# Patient Record
Sex: Male | Born: 1990
Health system: Southern US, Community
[De-identification: ages and names within clinical notes are randomized; demographics above are authoritative.]

## PROBLEM LIST (undated history)

## (undated) DIAGNOSIS — N508 Other specified disorders of male genital organs: Secondary | ICD-10-CM

## (undated) DIAGNOSIS — F419 Anxiety disorder, unspecified: Secondary | ICD-10-CM

## (undated) DIAGNOSIS — R569 Unspecified convulsions: Secondary | ICD-10-CM

## (undated) DIAGNOSIS — J019 Acute sinusitis, unspecified: Secondary | ICD-10-CM

## (undated) DIAGNOSIS — N390 Urinary tract infection, site not specified: Secondary | ICD-10-CM

## (undated) HISTORY — DX: Urinary tract infection, site not specified: N39.0

## (undated) HISTORY — DX: Anxiety disorder, unspecified: F41.9

## (undated) HISTORY — DX: Unspecified convulsions: R56.9

## (undated) HISTORY — DX: Acute sinusitis, unspecified: J01.90

## (undated) HISTORY — PX: ANKLE ARTHROSCOPY: SHX545

## (undated) HISTORY — PX: DG THUMB RIGHT HAND (ARMC HX): HXRAD1825

## (undated) HISTORY — PX: MYRINGOTOMY WITH TUBE PLACEMENT: SHX5663

## (undated) HISTORY — DX: Other specified disorders of male genital organs: N50.8

## (undated) HISTORY — PX: TOE SURGERY: SHX1073

---
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
## 2009-04-07 ENCOUNTER — Ambulatory Visit: Payer: Self-pay | Admitting: Psychologist

## 2009-04-20 ENCOUNTER — Ambulatory Visit: Payer: Self-pay | Admitting: Psychologist

## 2009-05-12 ENCOUNTER — Ambulatory Visit: Payer: Self-pay | Admitting: Psychologist

## 2009-07-21 ENCOUNTER — Ambulatory Visit: Payer: Self-pay | Admitting: Psychologist

## 2009-08-19 ENCOUNTER — Ambulatory Visit: Payer: Self-pay | Admitting: Psychologist

## 2009-09-23 ENCOUNTER — Ambulatory Visit: Payer: Self-pay | Admitting: Psychologist

## 2009-11-07 ENCOUNTER — Ambulatory Visit: Payer: Self-pay | Admitting: Internal Medicine

## 2009-11-08 LAB — CONVERTED CEMR LAB
ALT: 22 units/L (ref 0–53)
AST: 16 units/L (ref 0–37)
Albumin: 4.3 g/dL (ref 3.5–5.2)
Alkaline Phosphatase: 53 units/L (ref 39–117)
BUN: 15 mg/dL (ref 6–23)
Basophils Absolute: 0 10*3/uL (ref 0.0–0.1)
Basophils Relative: 0.7 % (ref 0.0–3.0)
Bilirubin Urine: NEGATIVE
Bilirubin, Direct: 0.2 mg/dL (ref 0.0–0.3)
CO2: 23 meq/L (ref 19–32)
Calcium: 9.8 mg/dL (ref 8.4–10.5)
Chloride: 106 meq/L (ref 96–112)
Cholesterol: 132 mg/dL (ref 0–200)
Creatinine, Ser: 0.9 mg/dL (ref 0.4–1.5)
Eosinophils Absolute: 0.1 10*3/uL (ref 0.0–0.7)
Eosinophils Relative: 2.3 % (ref 0.0–5.0)
GFR calc non Af Amer: 115.48 mL/min (ref >60)
Glucose, Bld: 80 mg/dL (ref 70–99)
HCT: 42.7 % (ref 39.0–52.0)
HDL: 43.5 mg/dL (ref >39.00)
Hemoglobin, Urine: NEGATIVE
Hemoglobin: 14.6 g/dL (ref 13.0–17.0)
Ketones, ur: NEGATIVE mg/dL
LDL Cholesterol: 81 mg/dL (ref 0–99)
Lymphocytes Relative: 18.4 % (ref 12.0–46.0)
Lymphs Abs: 1 10*3/uL (ref 0.7–4.0)
MCHC: 34.1 g/dL (ref 30.0–36.0)
MCV: 97.6 fL (ref 78.0–100.0)
Monocytes Absolute: 0.5 10*3/uL (ref 0.1–1.0)
Monocytes Relative: 9.9 % (ref 3.0–12.0)
Neutro Abs: 3.8 10*3/uL (ref 1.4–7.7)
Neutrophils Relative %: 68.7 % (ref 43.0–77.0)
Nitrite: POSITIVE
Platelets: 217 10*3/uL (ref 150.0–400.0)
Potassium: 3.8 meq/L (ref 3.5–5.1)
RBC: 4.38 M/uL (ref 4.22–5.81)
RDW: 11.6 % (ref 11.5–14.6)
Sodium: 141 meq/L (ref 135–145)
Specific Gravity, Urine: 1.02 (ref 1.000–1.030)
TSH: 1.11 microintl units/mL (ref 0.35–5.50)
Total Bilirubin: 0.9 mg/dL (ref 0.3–1.2)
Total CHOL/HDL Ratio: 3
Total Protein: 6.7 g/dL (ref 6.0–8.3)
Triglycerides: 37 mg/dL (ref 0.0–149.0)
Urine Glucose: NEGATIVE mg/dL
Urobilinogen, UA: 1 (ref 0.0–1.0)
VLDL: 7.4 mg/dL (ref 0.0–40.0)
WBC: 5.4 10*3/uL (ref 4.5–10.5)
pH: 6.5 (ref 5.0–8.0)

## 2009-11-09 ENCOUNTER — Ambulatory Visit: Payer: Self-pay | Admitting: Internal Medicine

## 2009-11-09 DIAGNOSIS — N508 Other specified disorders of male genital organs: Secondary | ICD-10-CM

## 2009-11-09 DIAGNOSIS — N390 Urinary tract infection, site not specified: Secondary | ICD-10-CM

## 2009-11-09 HISTORY — DX: Urinary tract infection, site not specified: N39.0

## 2009-11-09 HISTORY — DX: Other specified disorders of male genital organs: N50.8

## 2009-11-14 ENCOUNTER — Encounter: Admission: RE | Admit: 2009-11-14 | Discharge: 2009-11-14 | Payer: Self-pay | Admitting: Internal Medicine

## 2010-01-06 ENCOUNTER — Ambulatory Visit: Payer: Self-pay | Admitting: Psychologist

## 2010-09-21 ENCOUNTER — Ambulatory Visit: Payer: Self-pay | Admitting: Internal Medicine

## 2010-09-21 LAB — CONVERTED CEMR LAB
ALT: 19 units/L (ref 0–53)
AST: 19 units/L (ref 0–37)
Albumin: 4.8 g/dL (ref 3.5–5.2)
Alkaline Phosphatase: 60 units/L (ref 39–117)
BUN: 13 mg/dL (ref 6–23)
Basophils Absolute: 0 10*3/uL (ref 0.0–0.1)
Basophils Relative: 0.4 % (ref 0.0–3.0)
Bilirubin Urine: NEGATIVE
Bilirubin, Direct: 0.2 mg/dL (ref 0.0–0.3)
CO2: 30 meq/L (ref 19–32)
Calcium: 9.8 mg/dL (ref 8.4–10.5)
Chloride: 98 meq/L (ref 96–112)
Cholesterol: 150 mg/dL (ref 0–200)
Creatinine, Ser: 1.1 mg/dL (ref 0.4–1.5)
Eosinophils Absolute: 0.1 10*3/uL (ref 0.0–0.7)
Eosinophils Relative: 1.1 % (ref 0.0–5.0)
GFR calc non Af Amer: 92.72 mL/min (ref >60)
Glucose, Bld: 96 mg/dL (ref 70–99)
HCT: 45.6 % (ref 39.0–52.0)
HDL: 36.6 mg/dL — ABNORMAL LOW (ref >39.00)
Hemoglobin, Urine: NEGATIVE
Hemoglobin: 15.9 g/dL (ref 13.0–17.0)
Ketones, ur: NEGATIVE mg/dL
LDL Cholesterol: 95 mg/dL (ref 0–99)
Leukocytes, UA: NEGATIVE
Lymphocytes Relative: 30.1 % (ref 12.0–46.0)
Lymphs Abs: 1.4 10*3/uL (ref 0.7–4.0)
MCHC: 34.9 g/dL (ref 30.0–36.0)
MCV: 96.9 fL (ref 78.0–100.0)
Monocytes Absolute: 0.4 10*3/uL (ref 0.1–1.0)
Monocytes Relative: 8.7 % (ref 3.0–12.0)
Neutro Abs: 2.8 10*3/uL (ref 1.4–7.7)
Neutrophils Relative %: 59.7 % (ref 43.0–77.0)
Nitrite: NEGATIVE
Platelets: 228 10*3/uL (ref 150.0–400.0)
Potassium: 4.4 meq/L (ref 3.5–5.1)
RBC: 4.71 M/uL (ref 4.22–5.81)
RDW: 12.5 % (ref 11.5–14.6)
Sodium: 136 meq/L (ref 135–145)
Specific Gravity, Urine: 1.02 (ref 1.000–1.030)
TSH: 1.23 microintl units/mL (ref 0.35–5.50)
Total Bilirubin: 0.8 mg/dL (ref 0.3–1.2)
Total CHOL/HDL Ratio: 4
Total Protein, Urine: NEGATIVE mg/dL
Total Protein: 7.6 g/dL (ref 6.0–8.3)
Triglycerides: 91 mg/dL (ref 0.0–149.0)
Urine Glucose: NEGATIVE mg/dL
Urobilinogen, UA: 0.2 (ref 0.0–1.0)
VLDL: 18.2 mg/dL (ref 0.0–40.0)
WBC: 4.7 10*3/uL (ref 4.5–10.5)
pH: 6.5 (ref 5.0–8.0)

## 2010-09-25 ENCOUNTER — Ambulatory Visit: Payer: Self-pay | Admitting: Internal Medicine

## 2010-09-25 DIAGNOSIS — J019 Acute sinusitis, unspecified: Secondary | ICD-10-CM | POA: Insufficient documentation

## 2010-09-25 HISTORY — DX: Acute sinusitis, unspecified: J01.90

## 2010-11-21 NOTE — Assessment & Plan Note (Signed)
Summary: Cpx/uhc/#/cd   Vital Signs:  Patient profile:   20 year old male Height:      71 inches Weight:      203.50 pounds BMI:     28.49 O2 Sat:      98 % on Room air Temp:     99.4 degrees F oral Pulse rate:   83 / minute BP sitting:   110 / 72  (left arm) Cuff size:   large  Vitals Entered By: Zella Ball Ewing CMA (AAMA) (September 25, 2010 2:03 PM)  O2 Flow:  Room air  CC: Adult Physical/RE   CC:  Adult Physical/RE.  History of Present Illness: here for wellness with mother present;  Pt denies CP, worsening sob, doe, wheezing, orthopnea, pnd, worsening LE edema, palps, dizziness or syncope  Pt denies new neuro symptoms such as headache, facial or extremity weakness .  No siezure > 2 yrs.  Has seen neuro recently with continusation of current med as pt is now trying to get a drivers license. Pt denies polydipsia, polyuria,  Overall good compliance with meds, not really trying to follow low chol diet, wt stable, little excercise however.  Overall good compliance with meds, and good tolerability.  No fever, wt loss, night sweats, loss of appetite or other constitutional symptoms  Denies worsening depressive symptoms, suicidal ideation, or panic.   Pt states good ability with ADL's, low fall risk, home safety reviewed and adequate, no significant change in hearing or vision, trying to follow lower chol diet, and occasionally active only with regular excercise.  Does have 2 days incidental facial pain, pressure, fever greenish d/c.    Problems Prior to Update: 1)  Sinusitis- Acute-nos  (ICD-461.9) 2)  Testicular Mass  (ICD-608.89) 3)  Uti  (ICD-599.0) 4)  Preventive Health Care  (ICD-V70.0) 5)  Preventive Health Care  (ICD-V70.0) 6)  Family History Diabetes 1st Degree Relative  (ICD-V18.0)  Medications Prior to Update: 1)  Lamictal 200 Mg  Tabs (Lamotrigine) .... 2  1/2 By Mouth Qam and 2 1/2 By Mouth Qhs 2)  Ciprofloxacin Hcl 500 Mg Tabs (Ciprofloxacin Hcl) .Marland Kitchen.. 1po Two Times A  Day  Current Medications (verified): 1)  Lamictal 200 Mg  Tabs (Lamotrigine) .... 2  1/2 By Mouth Qam and 2 1/2 By Mouth Qhs 2)  Azithromycin 250 Mg Tabs (Azithromycin) .... 2po Qd For 1 Day, Then 1po Qd For 4days, Then Stop  Allergies (verified): No Known Drug Allergies  Past History:  Past Surgical History: Last updated: 08/18/2007 right knee surgury - meniscus tear/minor acl tear -6/07  Family History: Last updated: 08/18/2007 Family History Diabetes 1st degree relative Family History Hypertension Family History of Arthritis  Social History: Last updated: 09/25/2010 Never Smoked Alcohol use-no Single HS graduate Drug use-no  Risk Factors: Smoking Status: never (08/18/2007)  Past Medical History: siezure disorder - gen'd tonic clonic - Dr Sharene Skeans  Social History: Never Smoked Alcohol use-no Single HS graduate Drug use-no  Review of Systems  The patient denies anorexia, fever, vision loss, decreased hearing, hoarseness, chest pain, syncope, dyspnea on exertion, peripheral edema, prolonged cough, headaches, hemoptysis, abdominal pain, melena, hematochezia, severe indigestion/heartburn, hematuria, incontinence, suspicious skin lesions, transient blindness, difficulty walking, depression, unusual weight change, abnormal bleeding, enlarged lymph nodes, and angioedema.         all otherwise negative per pt -    Physical Exam  General:  alert and well-developed.   Head:  normocephalic and atraumatic.   Eyes:  vision grossly intact,  pupils equal, and pupils round.   Ears:  bilat tm's midl red, sinus tender bilat Nose:  no external deformity and no nasal discharge.   Mouth:  no gingival abnormalities and pharynx pink and moist.   Neck:  supple and no masses.   Lungs:  normal respiratory effort and normal breath sounds.   Heart:  normal rate and regular rhythm.   Abdomen:  soft, non-tender, and normal bowel sounds.   Msk:  no joint tenderness and no joint  swelling.   Extremities:  no edema, no erythema  Neurologic:  cranial nerves II-XII intact and strength normal in all extremities.   Skin:  color normal and no rashes.   Psych:  not anxious appearing and not depressed appearing.     Impression & Recommendations:  Problem # 1:  Preventive Health Care (ICD-V70.0) Overall doing well, age appropriate education and counseling updated, referral for preventive services and immunizations addressed, dietary counseling and smoking status adressed , most recent labs reviewed I have personally reviewed and have noted 1.The patient's medical and social history 2.Their use of alcohol, tobacco or illicit drugs 3.Their current medications and supplements 4. Functional ability including ADL's, fall risk, home safety risk, hearing & visual impairment  5.Diet and physical activities 6.Evidence for depression or mood disorders The patients weight, height, BMI  have been recorded in the chart I have made referrals, counseling and provided education to the patient based review of the above   Problem # 2:  SINUSITIS- ACUTE-NOS (ICD-461.9)  The following medications were removed from the medication list:    Ciprofloxacin Hcl 500 Mg Tabs (Ciprofloxacin hcl) .Marland Kitchen... 1po two times a day His updated medication list for this problem includes:    Azithromycin 250 Mg Tabs (Azithromycin) .Marland Kitchen... 2po qd for 1 day, then 1po qd for 4days, then stop treat as above, f/u any worsening signs or symptoms   Complete Medication List: 1)  Lamictal 200 Mg Tabs (Lamotrigine) .... 2  1/2 by mouth qam and 2 1/2 by mouth qhs 2)  Azithromycin 250 Mg Tabs (Azithromycin) .... 2po qd for 1 day, then 1po qd for 4days, then stop  Other Orders: Admin 1st Vaccine (30865) Flu Vaccine 88yrs + (78469)  Patient Instructions: 1)  you had the flu shot today 2)  Please take all new medications as prescribed - the antibiotic 3)  Continue all previous medications as before this visit  4)   Please schedule a follow-up appointment in 1 year, or sooner if needed Prescriptions: AZITHROMYCIN 250 MG TABS (AZITHROMYCIN) 2po qd for 1 day, then 1po qd for 4days, then stop  #6 x 1   Entered and Authorized by:   Corwin Levins MD   Signed by:   Corwin Levins MD on 09/25/2010   Method used:   Print then Give to Patient   RxID:   [id number]    Orders Added: 1)  Admin 1st Vaccine [90471] 2)  Flu Vaccine 55yrs + [72536] 3)  Est. Patient 18-39 years [64403]  Flu Vaccine Consent Questions     Do you have a history of severe allergic reactions to this vaccine? no    Any prior history of allergic reactions to egg and/or gelatin? no    Do you have a sensitivity to the preservative Thimersol? no    Do you have a past history of Guillan-Barre Syndrome? no    Do you currently have an acute febrile illness? no    Have you ever had a severe  reaction to latex? no    Vaccine information given and explained to patient? yes    Are you currently pregnant? no    Lot Number:[id number]   Exp Date:04/21/2011   Site Given  Left Deltoid IM       .[email or web address]

## 2010-11-21 NOTE — Assessment & Plan Note (Signed)
Summary: ?uti/#/cd   Vital Signs:  Patient profile:   20 year old male Height:      72 inches (182.88 cm) Weight:      182.13 pounds (82.79 kg) BMI:     24.79 O2 Sat:      99 % on Room air Temp:     98.5 degrees F (36.94 degrees C) oral Pulse rate:   85 / minute BP sitting:   128 / 70  (left arm) Cuff size:   regular  Vitals Entered By: Josph Macho CMA (November 09, 2009 4:25 PM)  O2 Flow:  Room air  CC: Possible UTI X2days- burning with urination (pt can't urinate right now)/ small knot on private area X1day/ CF   CC:  Possible UTI X2days- burning with urination (pt can't urinate right now)/ small knot on private area X1day/ CF.  History of Present Illness: here wtih mother;  states hx of pain on urination with freq and pain/swelling/tender to right testicle area;  started cipro yest and actually better today  Preventive Screening-Counseling & Management      Drug Use:  no.    Problems Prior to Update: 1)  Preventive Health Care  (ICD-V70.0) 2)  Preventive Health Care  (ICD-V70.0) 3)  Family History Diabetes 1st Degree Relative  (ICD-V18.0)  Medications Prior to Update: 1)  Lamictal 200 Mg  Tabs (Lamotrigine) .Marland Kitchen.. 1  1/2 By Mouth Qam and 2 By Mouth Qhs 2)  Ciprofloxacin Hcl 500 Mg Tabs (Ciprofloxacin Hcl) .Marland Kitchen.. 1po Two Times A Day  Current Medications (verified): 1)  Lamictal 200 Mg  Tabs (Lamotrigine) .... 2  1/2 By Mouth Qam and 2 1/2 By Mouth Qhs 2)  Ciprofloxacin Hcl 500 Mg Tabs (Ciprofloxacin Hcl) .Marland Kitchen.. 1po Two Times A Day  Allergies (verified): No Known Drug Allergies  Past History:  Past Medical History: Last updated: 08/18/2007 siezure disorder - gen'd tonic clonic  Past Surgical History: Last updated: 08/18/2007 right knee surgury - meniscus tear/minor acl tear -6/07  Family History: Last updated: 08/18/2007 Family History Diabetes 1st degree relative Family History Hypertension Family History of Arthritis  Social History: Last updated:  11/09/2009 Never Smoked Alcohol use-no Single senior - HS - [employer] Drug use-no  Risk Factors: Smoking Status: never (08/18/2007)  Social History: Reviewed history from 10/14/2008 and no changes required. Never Smoked Alcohol use-no Single senior - HS - [employer] Drug use-no Drug Use:  no  Review of Systems  The patient denies anorexia, fever, weight loss, weight gain, vision loss, decreased hearing, hoarseness, chest pain, syncope, dyspnea on exertion, peripheral edema, prolonged cough, headaches, hemoptysis, abdominal pain, melena, hematochezia, severe indigestion/heartburn, hematuria, incontinence, muscle weakness, suspicious skin lesions, transient blindness, difficulty walking, depression, unusual weight change, abnormal bleeding, enlarged lymph nodes, and angioedema.         all otherwise negative per pt -  Physical Exam  General:  alert and well-developed.  , mild ill  Head:  normocephalic and atraumatic.   Eyes:  vision grossly intact, pupils equal, and pupils round.   Ears:  R ear normal and L ear normal.   Nose:  no external deformity and no nasal discharge.   Mouth:  no gingival abnormalities and pharynx pink and moist.   Neck:  supple and no masses.   Lungs:  normal respiratory effort and normal breath sounds.   Heart:  normal rate and regular rhythm.   Abdomen:  soft, non-tender, and normal bowel sounds.   Genitalia:  Testes bilaterally descended without nodularity, tenderness  or masses. No scrotal masses or lesions. No penis lesions or urethral discharge. except for right testicle mild tender and with ? mass to lower pole right testicle Msk:  no joint tenderness and no joint swelling.   Extremities:  no edema, no erythema  Neurologic:  cranial nerves II-XII intact and strength normal in all extremities.   Psych:  moderately anxious.     Impression & Recommendations:  Problem # 1:  Preventive Health Care (ICD-V70.0) Overall doing well, updated the age appropriate  counseling and education;  routine health screening/prevention reviewed and done as appropriate unless declined, immunizations up to date or declined, diet counseling done if overweight, urged to quit smoking if smokes , most recent labs reviewed and current ordered if appropriate, ecg reviewed or declined; information regarding Medicare Prevention requirements given if appropriate   Problem # 2:  UTI (ICD-599.0)  His updated medication list for this problem includes:    Ciprofloxacin Hcl 500 Mg Tabs (Ciprofloxacin hcl) .Marland Kitchen... 1po two times a day to chekc urine cx   Orders: T-Culture, Urine ([id number])  Problem # 3:  TESTICULAR MASS (ICD-608.89)  for testicle u/s  Orders: Misc. Referral (Misc. Ref)  Complete Medication List: 1)  Lamictal 200 Mg Tabs (Lamotrigine) .... 2  1/2 by mouth qam and 2 1/2 by mouth qhs 2)  Ciprofloxacin Hcl 500 Mg Tabs (Ciprofloxacin hcl) .Marland Kitchen.. 1po two times a day  Other Orders: Tdap => 76yrs IM (28413) Admin 1st Vaccine (24401)  Patient Instructions: 1)  you had the tetanus shot today 2)  Continue all previous medications as before this visit - the antibiotic 3)  Please go to the Lab in the basement for your  urine tests today  4)  You will be contacted about the referral(s) to: Ultrasound of the testicle 5)  Please schedule a follow-up appointment in 1 year or sooner if needed   Orders Added: 1)  T-Culture, Urine [[id number]] 2)  Tdap => 28yrs IM [90715] 3)  Admin 1st Vaccine [90471] 4)  Misc. Referral [Misc. Ref] 5)  Est. Patient 18-39 years [99395] 6)  Est. Patient Level III [99213]    Immunizations Administered:  Tetanus Vaccine:    Vaccine Type: Tdap    Site: left deltoid    Mfr: [employer]    Dose: 0.5 ml    Route: IM    Given by: Josph Macho CMA    Exp. Date: 12/17/2011    Lot #: [id number]    VIS given: 09/09/07 version given November 09, 2009.

## 2011-02-05 ENCOUNTER — Ambulatory Visit
Admission: RE | Admit: 2011-02-05 | Discharge: 2011-02-05 | Disposition: A | Discharge status: Home / Self Care | Payer: 59 | Source: Ambulatory Visit | Attending: Pediatrics | Admitting: Pediatrics

## 2011-02-05 ENCOUNTER — Other Ambulatory Visit: Payer: Self-pay | Admitting: Pediatrics

## 2011-02-05 DIAGNOSIS — G40309 Generalized idiopathic epilepsy and epileptic syndromes, not intractable, without status epilepticus: Secondary | ICD-10-CM

## 2011-02-05 DIAGNOSIS — R5383 Other fatigue: Secondary | ICD-10-CM

## 2011-02-05 DIAGNOSIS — S0990XA Unspecified injury of head, initial encounter: Secondary | ICD-10-CM

## 2011-02-05 DIAGNOSIS — R5381 Other malaise: Secondary | ICD-10-CM

## 2011-02-05 DIAGNOSIS — G44309 Post-traumatic headache, unspecified, not intractable: Secondary | ICD-10-CM

## 2011-02-12 ENCOUNTER — Ambulatory Visit (INDEPENDENT_AMBULATORY_CARE_PROVIDER_SITE_OTHER): Payer: 59 | Admitting: Internal Medicine

## 2011-02-12 ENCOUNTER — Other Ambulatory Visit: Payer: 59

## 2011-02-12 ENCOUNTER — Encounter: Payer: Self-pay | Admitting: Internal Medicine

## 2011-02-12 VITALS — BP 110/70 | HR 74 | Temp 98.5°F | Ht 70.0 in | Wt 199.1 lb

## 2011-02-12 DIAGNOSIS — R300 Dysuria: Secondary | ICD-10-CM

## 2011-02-12 LAB — POCT URINALYSIS DIPSTICK
Blood, UA: NEGATIVE
Glucose, UA: NEGATIVE
Leukocytes, UA: NEGATIVE
Nitrite, UA: NEGATIVE
Urobilinogen, UA: NEGATIVE
pH, UA: 7

## 2011-02-12 NOTE — Patient Instructions (Signed)
Continue all other medications as before Your urine dipstick in the office today is OK, and your Exam is good We will send the urine for culture, and you will be notified directly if you need treatment Please call the number on the [facility] Card (the [employer] [facility]) for results of testing in 2-3 days, if you have not heard from [place] directly

## 2011-02-12 NOTE — Progress Notes (Signed)
  Subjective:    Patient ID: Christopher Reed, male    DOB: 10/30/1990, 20 y.o.   MRN: [id number]  HPI  Here to f/u with c/o onset dysuria this am x 1 with somewhat ? Darker urine;  No abd or back pain, frequency, urgency, hematuria, retention, f/c or other new complaints.   Pt denies chest pain, increased sob or doe, wheezing, orthopnea, PND, increased LE swelling, palpitations, dizziness or syncope.  Pt denies new neurological symptoms such as new headache, or facial or extremity weakness or numbness   Pt denies polydipsia, polyuria.   Past Medical History  Diagnosis Date  . SINUSITIS- ACUTE-NOS 09/25/2010  . UTI 11/09/2009  . TESTICULAR MASS 11/09/2009   Past Surgical History  Procedure Date  . Knee surgury 03/2006    Right knee - meniscus tear/minor ACL tear    reports that he has never smoked. He does not have any smokeless tobacco history on file. He reports that he does not drink alcohol or use illicit drugs. family history includes Arthritis in his other; Diabetes in his other; and Hypertension in his other. No Known Allergies No current outpatient prescriptions on file prior to visit.   Review of Systems Review of Systems  Constitutional: Negative for diaphoresis and unexpected weight change.  HENT: Negative for drooling and tinnitus.   Eyes: Negative for photophobia and visual disturbance.  Respiratory: Negative for choking and stridor.   Gastrointestinal: Negative for vomiting and blood in stool.  Genitourinary: Negative for hematuria and decreased urine volume.  Musculoskeletal: Negative for gait problem.  Skin: Negative for color change and wound.  Neurological: Negative for tremors and numbness.  Psychiatric/Behavioral: Negative for decreased concentration. The patient is not hyperactive.       Objective:   Physical Exam BP 110/70  Pulse 74  Temp(Src) 98.5 F (36.9 C) (Oral)  Ht 5\' 10"  (1.778 m)  Wt 199 lb 2 oz (90.323 kg)  BMI 28.57 kg/m2  SpO2 98% Physical  Exam  VS noted Constitutional: Pt appears well-developed and well-nourished.  HENT: Head: Normocephalic.  Right Ear: External ear normal.  Left Ear: External ear normal.  Eyes: Conjunctivae and EOM are normal. Pupils are equal, round, and reactive to light.  Neck: Normal range of motion. Neck supple.  Cardiovascular: Normal rate and regular rhythm.   Pulmonary/Chest: Effort normal and breath sounds normal.  Abd:  Soft, NT, non-distended, + BS Neurological: Pt is alert. No cranial nerve deficit.  Skin: Skin is warm. No erythema.   No flank tender Psychiatric: Pt behavior is normal. Thought content normal.  ? Mild dysphoric       Assessment & Plan:

## 2011-02-12 NOTE — Assessment & Plan Note (Signed)
Mild, onset today, without fever and exam o/w benign, to check urine study with symptom and hx of UTI,  to f/u any worsening symptoms or concerns

## 2011-02-13 ENCOUNTER — Ambulatory Visit: Payer: 59 | Admitting: Internal Medicine

## 2011-02-14 ENCOUNTER — Telehealth: Payer: Self-pay | Admitting: Internal Medicine

## 2011-02-14 MED ORDER — DOXYCYCLINE HYCLATE 100 MG PO TABS: 100.0000 mg | ORAL_TABLET | Freq: Two times a day (BID) | ORAL | Status: AC

## 2011-02-14 NOTE — Telephone Encounter (Signed)
Please inform pt , we found mild low grade UTI - for tx with antibx

## 2011-02-15 NOTE — Telephone Encounter (Signed)
Left message on machine for pt to return my call  

## 2011-02-16 NOTE — Telephone Encounter (Signed)
Pt's mother advised. 

## 2011-03-09 NOTE — Procedures (Signed)
CLINICAL HISTORY:  The patient is a 20 year old with a history of  generalized tonic-clonic seizure and possible staring spells.  The study is  being done to look for the presence of seizures.   PROCEDURE:  The procedure is carried out on a 32-channel digital Cadwell  recorder reformatted into 16-channel montages with one devoted to EKG.  The  patient was awake during the recording.  He takes no medication.  The  international 10/20 system of lead placement was used.   DESCRIPTION OF FINDINGS:  The dominant frequency is a 10 Hertz 50 microvolt  activity that is well modulated and regulated and attenuates partially with  eye opening.   Background activity is a mixture of predominantly alpha and beta range  activity that is broadly distributed with occasional theta range component  seen in the central and posterior regions.   There was no focal slowing.  There was no interictal epileptiform activity  in the form of spikes or sharp waves.   Activating procedures with photic stimulation and hyperventilation failed to  induce definite driving response.   EKG showed a regular sinus rhythm with ventricular response of 66 beats per  minute.   IMPRESSION:  Normal record in the waking state.    WILLIAM H. Sharene Skeans, M.D.   ZOX:WRUE  D:  12/13/2003 15:44:10  T:  12/13/2003 16:15:47  Job #:  [id number]

## 2011-06-07 ENCOUNTER — Ambulatory Visit (HOSPITAL_COMMUNITY)
Admission: RE | Admit: 2011-06-07 | Discharge: 2011-06-07 | Disposition: A | Discharge status: Home / Self Care | Payer: 59 | Source: Ambulatory Visit | Attending: Pediatrics | Admitting: Pediatrics

## 2011-06-07 DIAGNOSIS — G40309 Generalized idiopathic epilepsy and epileptic syndromes, not intractable, without status epilepticus: Secondary | ICD-10-CM | POA: Insufficient documentation

## 2011-06-07 DIAGNOSIS — R9401 Abnormal electroencephalogram [EEG]: Secondary | ICD-10-CM | POA: Insufficient documentation

## 2011-06-07 NOTE — Procedures (Signed)
EEG NUMBER:  12 - [id number].  CLINICAL HISTORY:  The patient is a 20 year old with a history of seizures beginning at age 13.  He has no recollection concerning his seizures.  They are both generalized tonic-clonic and nonconvulsive.  At times, the patient becomes rigid with generalized tonic-clonic activity and at other times when with the nonconvulsive events he appears to be daydreaming.  He has no recollection prior to the events.  His last known seizure was this past spring.  The study is being done to look for the presence of a seizure disorder and to compare it to a study from December 13, 2003. (345.10, 345.00)  PROCEDURE:  The tracing is carried out on a 32-channel digital Cadwell recorder reformatted into 16-channel montages with one devoted to EKG. The patient was awake and asleep having been sleep-deprived for this study.  Recording time was 22 minutes.  MEDICATIONS:  Lamictal.  DESCRIPTION OF FINDINGS:  Dominant frequency is a 30 microvolt 7 Hz activity.  The patient is drowsy throughout the record and may drift into behavioral sleep.  Rhythmic 30-40 microvolt delta range activity was prominent during this portion of the record.  The most striking finding of the record was drowsy that was generalized spike and slow wave discharges that were present both awake and became more pseudo periodic when the patient was drowsy.  They were present throughout the record.  These were diphasic, triphasic spike and slow wave discharges.  There were interictal.  There was no change in behavior during these episodes.  EKG showed regular sinus rhythm with ventricular response of 66 beats per minute.  IMPRESSION:  Abnormal EEG on the basis of described frontally predominant generalized spike and slow wave discharges that were at times pseudoperiodic with sleep.  In comparison with the previous record that showed a normal waking state, this is abnormal on the basis of the interictal  activity.  The slowing in the background may reflect the sleep deprivation to the patient.     Deanna Artis. Sharene Skeans, M.D. Electronically Signed    ZOX:WRUE D:  06/07/2011 17:07:52  T:  06/07/2011 20:41:49  Job #:  [id number]

## 2011-07-26 ENCOUNTER — Ambulatory Visit (INDEPENDENT_AMBULATORY_CARE_PROVIDER_SITE_OTHER): Payer: 59

## 2011-07-26 DIAGNOSIS — Z23 Encounter for immunization: Secondary | ICD-10-CM

## 2011-10-02 ENCOUNTER — Telehealth: Payer: Self-pay

## 2011-10-02 DIAGNOSIS — Z0000 Encounter for general adult medical examination without abnormal findings: Secondary | ICD-10-CM

## 2011-10-02 NOTE — Telephone Encounter (Signed)
Put order in for physical labs. 

## 2011-10-27 ENCOUNTER — Encounter: Payer: Self-pay | Admitting: Internal Medicine

## 2011-10-27 DIAGNOSIS — Z0001 Encounter for general adult medical examination with abnormal findings: Secondary | ICD-10-CM | POA: Insufficient documentation

## 2011-10-27 DIAGNOSIS — Z0000 Encounter for general adult medical examination without abnormal findings: Secondary | ICD-10-CM | POA: Insufficient documentation

## 2011-10-29 ENCOUNTER — Telehealth: Payer: Self-pay

## 2011-10-29 NOTE — Telephone Encounter (Signed)
Yes, ok for this

## 2011-10-29 NOTE — Telephone Encounter (Signed)
The patients mother is informed.

## 2011-10-29 NOTE — Telephone Encounter (Signed)
Patients mother Nettie Elm called to question if ok for the patient to take his Lamictal before getting his labs for his appointment on 12/06/11, please advise call back number for the mother is [phone]

## 2011-10-31 ENCOUNTER — Encounter: Payer: 59 | Admitting: Internal Medicine

## 2011-12-04 ENCOUNTER — Other Ambulatory Visit (INDEPENDENT_AMBULATORY_CARE_PROVIDER_SITE_OTHER): Payer: 59

## 2011-12-04 DIAGNOSIS — Z0000 Encounter for general adult medical examination without abnormal findings: Secondary | ICD-10-CM

## 2011-12-04 LAB — CBC WITH DIFFERENTIAL/PLATELET
Basophils Relative: 0.5 % (ref 0.0–3.0)
Eosinophils Relative: 3.4 % (ref 0.0–5.0)
Hemoglobin: 15.7 g/dL (ref 13.0–17.0)
Lymphocytes Relative: 31.9 % (ref 12.0–46.0)
MCV: 95.2 fl (ref 78.0–100.0)
Monocytes Absolute: 0.5 10*3/uL (ref 0.1–1.0)
Neutro Abs: 2.5 10*3/uL (ref 1.4–7.7)
Neutrophils Relative %: 54 % (ref 43.0–77.0)
RBC: 4.77 Mil/uL (ref 4.22–5.81)
WBC: 4.5 10*3/uL (ref 4.5–10.5)

## 2011-12-04 LAB — LIPID PANEL
Cholesterol: 153 mg/dL (ref 0–200)
LDL Cholesterol: 101 mg/dL — ABNORMAL HIGH (ref 0–99)
VLDL: 9.2 mg/dL (ref 0.0–40.0)

## 2011-12-04 LAB — URINALYSIS, ROUTINE W REFLEX MICROSCOPIC
Bilirubin Urine: NEGATIVE
Hgb urine dipstick: NEGATIVE
Leukocytes, UA: NEGATIVE
Nitrite: NEGATIVE

## 2011-12-04 LAB — HEPATIC FUNCTION PANEL
ALT: 20 U/L (ref 0–53)
AST: 14 U/L (ref 0–37)
Albumin: 4.5 g/dL (ref 3.5–5.2)

## 2011-12-04 LAB — BASIC METABOLIC PANEL
BUN: 17 mg/dL (ref 6–23)
Chloride: 104 mEq/L (ref 96–112)
Creatinine, Ser: 1.1 mg/dL (ref 0.4–1.5)
Glucose, Bld: 89 mg/dL (ref 70–99)
Potassium: 4.2 mEq/L (ref 3.5–5.1)

## 2011-12-04 LAB — TSH: TSH: 1.39 u[IU]/mL (ref 0.35–5.50)

## 2011-12-06 ENCOUNTER — Ambulatory Visit (INDEPENDENT_AMBULATORY_CARE_PROVIDER_SITE_OTHER): Payer: 59 | Admitting: Internal Medicine

## 2011-12-06 ENCOUNTER — Encounter: Payer: Self-pay | Admitting: Internal Medicine

## 2011-12-06 VITALS — BP 108/70 | HR 68 | Temp 98.6°F | Ht 70.0 in | Wt 199.5 lb

## 2011-12-06 DIAGNOSIS — Z0000 Encounter for general adult medical examination without abnormal findings: Secondary | ICD-10-CM

## 2011-12-06 NOTE — Patient Instructions (Signed)
Continue all other medications as before Please keep your appointments with your specialists as you have planned You are up to date with prevention Please return in 1 year for your yearly visit, or sooner if needed, with Lab testing done 3-5 days before

## 2011-12-08 ENCOUNTER — Encounter: Payer: Self-pay | Admitting: Internal Medicine

## 2011-12-08 DIAGNOSIS — F419 Anxiety disorder, unspecified: Secondary | ICD-10-CM | POA: Insufficient documentation

## 2011-12-08 NOTE — Progress Notes (Signed)
Subjective:    Patient ID: Christopher Reed, male    DOB: [birth year], 21 y.o.   MRN: [id number]  HPI  Here for wellness and f/u;  Overall doing ok;  Pt denies CP, worsening SOB, DOE, wheezing, orthopnea, PND, worsening LE edema, palpitations, dizziness or syncope.  Pt denies neurological change such as new Headache, facial or extremity weakness.  Pt denies polydipsia, polyuria, or low sugar symptoms. Pt states overall good compliance with treatment and medications, good tolerability, and trying to follow lower cholesterol diet.  Pt denies worsening depressive symptoms, suicidal ideation or panic. No fever, wt loss, night sweats, loss of appetite, or other constitutional symptoms.  Pt states good ability with ADL's, low fall risk, home safety reviewed and adequate, no significant changes in hearing or vision, and occasionally active with exercise.  No acute complaints today Past Medical History  Diagnosis Date  . SINUSITIS- ACUTE-NOS 09/25/2010  . UTI 11/09/2009  . TESTICULAR MASS 11/09/2009   Past Surgical History  Procedure Date  . Knee surgury 03/2006    Right knee - meniscus tear/minor ACL tear    reports that he has never smoked. He does not have any smokeless tobacco history on file. He reports that he does not drink alcohol or use illicit drugs. family history includes Arthritis in his other; Diabetes in his other; and Hypertension in his other. No Known Allergies Current Outpatient Prescriptions on File Prior to Visit  Medication Sig Dispense Refill  . lamoTRIgine (LAMICTAL) 200 MG tablet Take 2 1/2 by mouth every morning and 3 by mouth every evening.        Review of Systems Review of Systems  Constitutional: Negative for diaphoresis, activity change, appetite change and unexpected weight change.  HENT: Negative for hearing loss, ear pain, facial swelling, mouth sores and neck stiffness.   Eyes: Negative for pain, redness and visual disturbance.  Respiratory: Negative for shortness  of breath and wheezing.   Cardiovascular: Negative for chest pain and palpitations.  Gastrointestinal: Negative for diarrhea, blood in stool, abdominal distention and rectal pain.  Genitourinary: Negative for hematuria, flank pain and decreased urine volume.  Musculoskeletal: Negative for myalgias and joint swelling.  Skin: Negative for color change and wound.  Neurological: Negative for syncope and numbness.  Hematological: Negative for adenopathy.  Psychiatric/Behavioral: Negative for hallucinations, self-injury, decreased concentration and agitation.      Objective:   Physical Exam BP 108/70  Pulse 68  Temp(Src) 98.6 F (37 C) (Oral)  Ht 5\' 10"  (1.778 m)  Wt 199 lb 8 oz (90.493 kg)  BMI 28.63 kg/m2  SpO2 99% Physical Exam  VS noted Constitutional: Pt is oriented to person, place, and time. Appears well-developed and well-nourished.  HENT:  Head: Normocephalic and atraumatic.  Right Ear: External ear normal.  Left Ear: External ear normal.  Nose: Nose normal.  Mouth/Throat: Oropharynx is clear and moist.  Eyes: Conjunctivae and EOM are normal. Pupils are equal, round, and reactive to light.  Neck: Normal range of motion. Neck supple. No JVD present. No tracheal deviation present.  Cardiovascular: Normal rate, regular rhythm, normal heart sounds and intact distal pulses.   Pulmonary/Chest: Effort normal and breath sounds normal.  Abdominal: Soft. Bowel sounds are normal. There is no tenderness.  Musculoskeletal: Normal range of motion. Exhibits no edema.  Lymphadenopathy:  Has no cervical adenopathy.  Neurological: Pt is alert and oriented to person, place, and time. Pt has normal reflexes. No cranial nerve deficit.  Skin: Skin is warm and dry.  No rash noted.  Psychiatric:  Has  normal mood and affect. Behavior is normal.     Assessment & Plan:

## 2011-12-08 NOTE — Assessment & Plan Note (Signed)

## 2012-03-31 ENCOUNTER — Telehealth: Payer: Self-pay | Admitting: Internal Medicine

## 2012-03-31 NOTE — Telephone Encounter (Signed)
Received 4 pages from [facility] neurolog[facility] , sent to Dr. Jonny Ruiz. sd 03/31/12.

## 2012-05-27 ENCOUNTER — Ambulatory Visit (INDEPENDENT_AMBULATORY_CARE_PROVIDER_SITE_OTHER): Payer: 59 | Admitting: Physician Assistant

## 2012-05-27 VITALS — BP 120/82 | HR 71 | Temp 98.1°F | Resp 16 | Ht 69.75 in | Wt 196.4 lb

## 2012-05-27 DIAGNOSIS — J309 Allergic rhinitis, unspecified: Secondary | ICD-10-CM

## 2012-05-27 DIAGNOSIS — R059 Cough, unspecified: Secondary | ICD-10-CM

## 2012-05-27 DIAGNOSIS — R05 Cough: Secondary | ICD-10-CM

## 2012-05-27 DIAGNOSIS — J029 Acute pharyngitis, unspecified: Secondary | ICD-10-CM

## 2012-05-27 LAB — POCT RAPID STREP A (OFFICE): Rapid Strep A Screen: NEGATIVE

## 2012-05-27 MED ORDER — AMOXICILLIN 875 MG PO TABS: 875.0000 mg | ORAL_TABLET | Freq: Two times a day (BID) | ORAL | Status: AC

## 2012-05-27 MED ORDER — BENZONATATE 100 MG PO CAPS: 100.0000 mg | ORAL_CAPSULE | Freq: Three times a day (TID) | ORAL | Status: AC | PRN

## 2012-05-27 MED ORDER — IPRATROPIUM BROMIDE 0.03 % NA SOLN: 2.0000 | Freq: Two times a day (BID) | NASAL | Status: DC

## 2012-05-27 NOTE — Progress Notes (Signed)
  Subjective:    Patient ID: Christopher Reed, male    DOB: 11/14/1990, 21 y.o.   MRN: [id number]  HPI 21 year old male presents with 2 day history of sore throat with slight productive cough. Denies fever, chills, nausea, vomiting, ear pain, or nasal congestion.  Has used a oral numbing medicine that he had from previous sore throats which has helped a little.      Review of Systems  All other systems reviewed and are negative.       Objective:   Physical Exam  Constitutional: He is oriented to person, place, and time. He appears well-developed and well-nourished.  HENT:  Head: Normocephalic and atraumatic.  Right Ear: External ear normal.  Left Ear: External ear normal.  Mouth/Throat: Oropharynx is clear and moist. No oropharyngeal exudate (slight tonsillar erythema).  Eyes: Conjunctivae are normal.  Neck: Normal range of motion.  Cardiovascular: Normal rate, regular rhythm and normal heart sounds.   Pulmonary/Chest: Effort normal and breath sounds normal.  Lymphadenopathy:    He has no cervical adenopathy.  Neurological: He is alert and oriented to person, place, and time.  Psychiatric: He has a normal mood and affect. His behavior is normal. Judgment and thought content normal.          Assessment & Plan:   1. Acute pharyngitis  POCT rapid strep A   Increase fluids and rest Recommend ibuprofen or tylenol as needed for pain If no improvement in symptoms in 2-3 days, may take amoxicillin.

## 2012-07-31 ENCOUNTER — Ambulatory Visit (INDEPENDENT_AMBULATORY_CARE_PROVIDER_SITE_OTHER): Payer: 59 | Admitting: General Practice

## 2012-07-31 DIAGNOSIS — Z23 Encounter for immunization: Secondary | ICD-10-CM

## 2012-11-19 ENCOUNTER — Ambulatory Visit: Payer: 59 | Admitting: Sports Medicine

## 2012-12-11 ENCOUNTER — Encounter: Payer: 59 | Admitting: Internal Medicine

## 2013-01-08 ENCOUNTER — Other Ambulatory Visit (INDEPENDENT_AMBULATORY_CARE_PROVIDER_SITE_OTHER): Payer: 59

## 2013-01-08 LAB — BASIC METABOLIC PANEL
CO2: 28 mEq/L (ref 19–32)
Calcium: 9.2 mg/dL (ref 8.4–10.5)
Glucose, Bld: 98 mg/dL (ref 70–99)
Potassium: 4 mEq/L (ref 3.5–5.1)
Sodium: 138 mEq/L (ref 135–145)

## 2013-01-08 LAB — LIPID PANEL
HDL: 39.5 mg/dL (ref >39.00)
Total CHOL/HDL Ratio: 3
Triglycerides: 25 mg/dL (ref 0.0–149.0)
VLDL: 5 mg/dL (ref 0.0–40.0)

## 2013-01-08 LAB — CBC WITH DIFFERENTIAL/PLATELET
Basophils Absolute: 0 10*3/uL (ref 0.0–0.1)
Eosinophils Absolute: 0.1 10*3/uL (ref 0.0–0.7)
HCT: 45 % (ref 39.0–52.0)
Hemoglobin: 15.4 g/dL (ref 13.0–17.0)
Lymphs Abs: 1.4 10*3/uL (ref 0.7–4.0)
MCHC: 34.1 g/dL (ref 30.0–36.0)
MCV: 95.2 fl (ref 78.0–100.0)
Monocytes Absolute: 0.4 10*3/uL (ref 0.1–1.0)
Monocytes Relative: 9.5 % (ref 3.0–12.0)
Neutro Abs: 2.4 10*3/uL (ref 1.4–7.7)
Platelets: 216 10*3/uL (ref 150.0–400.0)
RDW: 12.4 % (ref 11.5–14.6)

## 2013-01-08 LAB — URINALYSIS, ROUTINE W REFLEX MICROSCOPIC
Bilirubin Urine: NEGATIVE
Leukocytes, UA: NEGATIVE
Nitrite: NEGATIVE
Urobilinogen, UA: 0.2 (ref 0.0–1.0)
pH: 6.5 (ref 5.0–8.0)

## 2013-01-08 LAB — HEPATIC FUNCTION PANEL
Albumin: 4.5 g/dL (ref 3.5–5.2)
Total Bilirubin: 0.5 mg/dL (ref 0.3–1.2)

## 2013-01-08 LAB — TSH: TSH: 1.16 u[IU]/mL (ref 0.35–5.50)

## 2013-01-15 ENCOUNTER — Encounter: Payer: Self-pay | Admitting: Internal Medicine

## 2013-01-15 ENCOUNTER — Ambulatory Visit (INDEPENDENT_AMBULATORY_CARE_PROVIDER_SITE_OTHER): Payer: 59 | Admitting: Internal Medicine

## 2013-01-15 VITALS — BP 110/82 | HR 68 | Temp 97.7°F | Ht 71.0 in | Wt 189.0 lb

## 2013-01-15 DIAGNOSIS — Z0000 Encounter for general adult medical examination without abnormal findings: Secondary | ICD-10-CM

## 2013-01-15 NOTE — Patient Instructions (Addendum)
You are in excellent health today Please take otc Zyrtec or allegra for allergies if needed Please continue your efforts at being more active, low cholesterol diet, and weight control. Thank you for enrolling in MyChart. Please follow the instructions below to securely access your online medical record. MyChart allows you to send messages to your doctor, view your test results, renew your prescriptions, schedule appointments, and more. To Log into My Chart online, please go by [employer] or [employer] to [email or web address], or download the MyChart App from the [employer] of [employer] .  Your Username is: Nature conservation officer) Please return in 1 year for your yearly visit, or sooner if needed, with Lab testing done 3-5 days before

## 2013-01-15 NOTE — Assessment & Plan Note (Signed)

## 2013-01-15 NOTE — Progress Notes (Signed)
Subjective:    Patient ID: Christopher Reed, male    DOB: Aug 23, 1991, 22 y.o.   MRN: [id number]  HPI  Here for wellness and f/u;  Overall doing ok;  Pt denies CP, worsening SOB, DOE, wheezing, orthopnea, PND, worsening LE edema, palpitations, dizziness or syncope.  Pt denies neurological change such as new headache, facial or extremity weakness.  Pt denies polydipsia, polyuria, or low sugar symptoms. Pt states overall good compliance with treatment and medications, good tolerability, and has been trying to follow lower cholesterol diet.  Pt denies worsening depressive symptoms, suicidal ideation or panic. No fever, night sweats, wt loss, loss of appetite, or other constitutional symptoms.  Pt states good ability with ADL's, has low fall risk, home safety reviewed and adequate, no other significant changes in hearing or vision, and only occasionally active with exercise.  No cute complaints Past Medical History  Diagnosis Date  . SINUSITIS- ACUTE-NOS 09/25/2010  . UTI 11/09/2009  . TESTICULAR MASS 11/09/2009  . Anxiety    Past Surgical History  Procedure Laterality Date  . Knee surgury  03/2006    Right knee - meniscus tear/minor ACL tear    reports that he has never smoked. He does not have any smokeless tobacco history on file. He reports that he does not drink alcohol or use illicit drugs. family history includes Arthritis in his other; Diabetes in his other; and Hypertension in his other. No Known Allergies Current Outpatient Prescriptions on File Prior to Visit  Medication Sig Dispense Refill  . lamoTRIgine (LAMICTAL) 200 MG tablet Take 2 1/2 by mouth every morning and 3 by mouth every evening.       Marland Kitchen ipratropium (ATROVENT) 0.03 % nasal spray Place 2 sprays into the nose 2 (two) times daily.  30 mL  5   No current facility-administered medications on file prior to visit.   Review of Systems Constitutional: Negative for diaphoresis, activity change, appetite change or unexpected weight  change.  HENT: Negative for hearing loss, ear pain, facial swelling, mouth sores and neck stiffness.   Eyes: Negative for pain, redness and visual disturbance.  Respiratory: Negative for shortness of breath and wheezing.   Cardiovascular: Negative for chest pain and palpitations.  Gastrointestinal: Negative for diarrhea, blood in stool, abdominal distention or other pain Genitourinary: Negative for hematuria, flank pain or change in urine volume.  Musculoskeletal: Negative for myalgias and joint swelling.  Skin: Negative for color change and wound.  Neurological: Negative for syncope and numbness. other than noted Hematological: Negative for adenopathy.  Psychiatric/Behavioral: Negative for hallucinations, self-injury, decreased concentration and agitation.      Objective:   Physical Exam BP 110/82  Pulse 68  Temp(Src) 97.7 F (36.5 C) (Oral)  Ht 5\' 11"  (1.803 m)  Wt 189 lb (85.73 kg)  BMI 26.37 kg/m2  SpO2 98% VS noted,  Constitutional: Pt is oriented to person, place, and time. Appears well-developed and well-nourished.  Head: Normocephalic and atraumatic.  Right Ear: External ear normal.  Left Ear: External ear normal.  Nose: Nose normal.  Mouth/Throat: Oropharynx is clear and moist.  Eyes: Conjunctivae and EOM are normal. Pupils are equal, round, and reactive to light.  Neck: Normal range of motion. Neck supple. No JVD present. No tracheal deviation present.  Cardiovascular: Normal rate, regular rhythm, normal heart sounds and intact distal pulses.   Pulmonary/Chest: Effort normal and breath sounds normal.  Abdominal: Soft. Bowel sounds are normal. There is no tenderness. No HSM  Musculoskeletal: Normal range of  motion. Exhibits no edema.  Lymphadenopathy:  Has no cervical adenopathy.  Neurological: Pt is alert and oriented to person, place, and time. Pt has normal reflexes. No cranial nerve deficit.  Skin: Skin is warm and dry. No rash noted.  Psychiatric:  Has  normal  mood and affect. Behavior is normal. mild nervous    Assessment & Plan:

## 2013-03-16 ENCOUNTER — Encounter: Payer: Self-pay | Admitting: Family

## 2013-03-16 DIAGNOSIS — G252 Other specified forms of tremor: Secondary | ICD-10-CM | POA: Insufficient documentation

## 2013-03-16 DIAGNOSIS — R5381 Other malaise: Secondary | ICD-10-CM

## 2013-03-16 DIAGNOSIS — Z79899 Other long term (current) drug therapy: Secondary | ICD-10-CM

## 2013-03-16 DIAGNOSIS — S0990XA Unspecified injury of head, initial encounter: Secondary | ICD-10-CM | POA: Insufficient documentation

## 2013-03-16 DIAGNOSIS — R404 Transient alteration of awareness: Secondary | ICD-10-CM | POA: Insufficient documentation

## 2013-03-16 DIAGNOSIS — R5383 Other fatigue: Secondary | ICD-10-CM | POA: Insufficient documentation

## 2013-03-16 DIAGNOSIS — G250 Essential tremor: Secondary | ICD-10-CM

## 2013-03-16 DIAGNOSIS — G40309 Generalized idiopathic epilepsy and epileptic syndromes, not intractable, without status epilepticus: Secondary | ICD-10-CM | POA: Insufficient documentation

## 2013-03-18 ENCOUNTER — Ambulatory Visit (INDEPENDENT_AMBULATORY_CARE_PROVIDER_SITE_OTHER): Payer: 59 | Admitting: Family

## 2013-03-18 ENCOUNTER — Encounter: Payer: Self-pay | Admitting: Family

## 2013-03-18 VITALS — BP 118/70 | HR 72 | Ht 70.25 in | Wt 194.8 lb

## 2013-03-18 DIAGNOSIS — G40309 Generalized idiopathic epilepsy and epileptic syndromes, not intractable, without status epilepticus: Secondary | ICD-10-CM

## 2013-03-18 DIAGNOSIS — G250 Essential tremor: Secondary | ICD-10-CM

## 2013-03-18 DIAGNOSIS — G252 Other specified forms of tremor: Secondary | ICD-10-CM

## 2013-03-18 DIAGNOSIS — R404 Transient alteration of awareness: Secondary | ICD-10-CM

## 2013-03-18 DIAGNOSIS — Z79899 Other long term (current) drug therapy: Secondary | ICD-10-CM

## 2013-03-18 NOTE — Patient Instructions (Addendum)
Continue taking Lamictal without change for now.  Have blood drawn tomorrow as we discussed. I will call you when I receive the results, and let you know if we need to change the dose of Lamictal.  I will send in the new Lamictal prescription after I receive the blood test results.  Let me know if you have any more seizures.  Plan to return for follow up in 6 months or sooner if needed.

## 2013-03-18 NOTE — Progress Notes (Signed)
Patient: Christopher Reed MRN: [id number] Sex: male DOB: 06/01/1991  Provider: Elveria Rising, NP Location of Care: [facility]  Note type: Routine return visit  History of Present Illness: Referral Source: Dr. Corwin Levins History from: patient and his mother Chief Complaint: Seizures  Christopher Reed is a 22 y.o. male with history of seizures. He had onset of seizures December 14, 2001 that were generalized tonic-clonic in nature.  He is taking and tolerating Lamictal, and is compliant with his medication. He has had episodes in which he was feeling dazed for a few seconds and it is not clear if these periods represented absence seizures. Today [name] and his mother report that he had a seizure on Mar 11, 2013. On that day, he and his family were working out in the yard taking down an above ground pool. He was hot, tired and was 2 hours late taking his medication that day. His mother said that he began staring, then began falling backwards. Fortunately she caught him before he fell. She said the seizure lasted about 2 minutes. He was not incontinent of urine. He vomited twice and was confused for awhile afterwards. Christopher Reed does not remember any warning prior to the seizure and does not remember any of the seizure events. His last known seizure prior to that was April 2012.   [name] also has an intermittent benign essential tremor that is noted most often when he is tired or under stress. He has some problems with anxiety. He has been otherwise healthy since last seen. He is working part-time at a [employer] account planner.   Review of Systems: 12 system review was unremarkable  Past Medical History  Diagnosis Date  . SINUSITIS- ACUTE-NOS 09/25/2010  . UTI 11/09/2009  . TESTICULAR MASS 11/09/2009  . Anxiety   . Seizures    Hospitalizations: no, Head Injury: no, Nervous System Infections: no, Immunizations up to date: yes Past Medical History Comments: Patient had a diagnosis of  attention deficit disorder and also had learning differences that required a 504 plan related to difficulty listening and taking notes and tremor which interfered with his writing. EEG December 13, 2003 was normal in the waking state. EEG April 02, 2009 showed frequent bursts of 4 second 3Hz  generalized 120 V spike, 101 120 V slow-wave activity unit otherwise normal background.   Surgical History Past Surgical History  Procedure Laterality Date  . Knee surgury  03/2006    Right knee - meniscus tear/minor ACL tear   Surgeries: yes Surgical History Comments: Right knee surgery in 2006, toe surgery on right foot in 2008 and left ankle surgery in 2013.  Family History family history includes Arthritis in his other; Diabetes in his other; Hepatitis C in his paternal grandmother; Hypertension in his other; Other in his maternal grandfather; and Pneumonia in his paternal grandfather. Family History is negative migraines, seizures, cognitive impairment, blindness, deafness, birth defects, chromosomal disorder, autism.  Social History History   Social History  . Marital Status: Single    Spouse Name: N/A    Number of Children: N/A  . Years of Education: N/A   Social History Main Topics  . Smoking status: Never Smoker   . Smokeless tobacco: None  . Alcohol Use: No  . Drug Use: No  . Sexually Active: None   Other Topics Concern  . None   Social History Narrative  . None   Educational level [employer] Diploma  Occupation: Company secretary Living with lives on  his own.  Hobbies/Interest: movies, video games   Current Outpatient Prescriptions on File Prior to Visit  Medication Sig Dispense Refill  . ipratropium (ATROVENT) 0.03 % nasal spray Place 2 sprays into the nose 2 (two) times daily.  30 mL  5  . lamoTRIgine (LAMICTAL) 200 MG tablet Take 2 1/2 by mouth every morning and 3 by mouth every evening.        No current facility-administered medications on file prior to  visit.   The medication list was reviewed and reconciled. All changes or newly prescribed medications were explained.  A complete medication list was provided to the patient/caregiver.  No Known Allergies  Physical Exam BP 118/70  Pulse 72  Ht 5' 10.25" (1.784 m)  Wt 194 lb 12.8 oz (88.361 kg)  BMI 27.76 kg/m2 General: alert, well developed, well nourished young man, in no acute distress, right-handed Head: normocephalic, no dysmorphic features Ears, Nose and Throat: Otoscopic: tympanic membranes normal .  Pharynx: oropharynx is pink without exudates or tonsillar hypertrophy. Neck: supple, full range of motion, no cranial or cervical bruits Respiratory: auscultation clear Cardiovascular: no murmurs, pulses are normal Musculoskeletal: no skeletal deformities or apparent scoliosis Skin: no rashes or neurocutaneous lesions  Neurologic Exam  Mental Status: alert; oriented to person, place, and year; knowledge is normal for age; language is normal Cranial Nerves: visual fields are full to double simultaneous stimuli; extraocular movements are full and conjugate; pupils are round reactive to light; funduscopic examination shows sharp disc margins with normal vessels; symmetric facial strength; midline tongue and uvula; hearing is normal and symmetric Motor: Normal strength, tone, and mass; good fine motor movements; no pronator drift. Barely perceptible bilateral hand tremor noted today. Sensory: intact responses to touch and temperature Coordination: good finger-to-nose, rapid repetitive alternating movements and finger apposition   Gait and Station: normal gait and station; patient is able to walk on heels, toes and tandem without difficulty; balance is adequate; Romberg exam is negative; Gower response is negative Reflexes: symmetric and diminished bilaterally; no clonus; bilateral flexor plantar responses.  Assessment and Plan [name] is a 22 year old young man with history of seizures.  He is taking and tolerating brand Lamictal. His most recent seizure occurred Mar 11, 2013. I have recommended that he have labs drawn to check his Lamictal level. We may need to increase his dose. I have given him a lab order and will call him when I receive the results. Josaiah and his mother agree with these plans.

## 2013-03-19 ENCOUNTER — Encounter: Payer: Self-pay | Admitting: Family

## 2013-03-20 ENCOUNTER — Telehealth: Payer: Self-pay

## 2013-03-20 NOTE — Telephone Encounter (Signed)
Sylvia lvm asking for pt's lab results. I called mom and informed her that labs were not back yet.

## 2013-03-23 ENCOUNTER — Telehealth: Payer: Self-pay | Admitting: Family

## 2013-03-23 DIAGNOSIS — G40309 Generalized idiopathic epilepsy and epileptic syndromes, not intractable, without status epilepticus: Secondary | ICD-10-CM

## 2013-03-23 NOTE — Telephone Encounter (Signed)
I left a message for Mom to discuss Christopher Reed' recent lab results. I told her that I will call her back tomorrow. TG

## 2013-03-24 MED ORDER — LAMOTRIGINE 200 MG PO TABS: ORAL_TABLET | ORAL | Status: DC

## 2013-03-24 NOTE — Telephone Encounter (Signed)
I left a message at both home and cell numbers and asked Mom to call me back. TG

## 2013-03-24 NOTE — Telephone Encounter (Signed)
Mom called back. I told her that the CBC and ALT were normal, and that the Lamotrigine level was 9.17mcg/ml. With the recent seizure that Christopher Reed experienced on May 21st, I instructed her to increase his Lamictal dose to 200mg , 3 tablets BID. I will send in new Rx to his mail order pharmacy at her request. I asked her to let me know if he has more seizures.

## 2013-04-21 ENCOUNTER — Other Ambulatory Visit: Payer: Self-pay | Admitting: Orthopedic Surgery

## 2013-04-29 ENCOUNTER — Encounter (HOSPITAL_BASED_OUTPATIENT_CLINIC_OR_DEPARTMENT_OTHER): Payer: Self-pay | Admitting: *Deleted

## 2013-05-06 NOTE — H&P (Signed)
Christopher Reed is an 22 y.o. male.   Chief Complaint: c/o chronic and progressive pain right thumb HPI:  [name] is 22 years of age and currently employed as an Ship broker at [employer].  In high school he sustained a sprain injury to his right thumb playing basketball.  He was treated at [facility] and briefly splinted.  He was told he had "fluid on the MP joint".  He has not had advanced imaging of his thumb.  He has had chronic pain at the thumb MP joint with occasional giving way episodes.  He presents for a hand surgery consult.   Past Medical History  Diagnosis Date  . SINUSITIS- ACUTE-NOS 09/25/2010  . UTI 11/09/2009  . TESTICULAR MASS 11/09/2009  . Anxiety   . Seizures     epilepsy since age 68yrs, last sz 1 mo ago, Lamictal dose inc at that time    Past Surgical History  Procedure Laterality Date  . Knee surgury  03/2006    Right knee - meniscus tear/minor ACL tear  . Ankle arthroscopy Left   . Toe surgery Right   . Myringotomy with tube placement      as child    Family History  Problem Relation Age of Onset  . Diabetes Other     1st degree relative  . Arthritis Other   . Hypertension Other   . Other Maternal Grandfather     Spinal Meningitis-Died at 64  . Hepatitis C Paternal Grandmother     Died at 69  . Pneumonia Paternal Grandfather     Died at 67   Social History:  reports that he has never smoked. He has never used smokeless tobacco. He reports that he does not drink alcohol or use illicit drugs.  Allergies: No Known Allergies  No prescriptions prior to admission    No results found for this or any previous visit (from the past 48 hour(s)).  No results found.   Pertinent items are noted in HPI.  Height 5\' 10"  (1.778 m), weight 86.183 kg (190 lb).  General appearance: alert Head: Normocephalic, without obvious abnormality Neck: supple, symmetrical, trachea midline Resp: clear to auscultation bilaterally Cardio: regular rate and  rhythm GI: normal findings: bowel sounds normal Extremities:  Careful inspection of his thumb reveals prominence of the radial condyle of the metacarpal and a slight ulnar deviation of less than 5 degrees. He has extension of the right thumb MP joint to just short of neutral with extension on the left to neutral. He has further flexion to 80 degrees symmetrical bilaterally.  He has a slight pronation posture of his right thumb MP joint compared to the left thumb.  On collateral ligament testing at 20 to 30 degrees of flexion he has 30 degrees of ulnar deviation instability on the right vs. 10 on the left.  He has equal stability of the ulnar collateral ligament at 0 and 30 degrees flexion.   Plain films of the thumb demonstrate normal bony anatomy.  He does have a very slight stepoff of his proximal phalanx off the metacarpal head.  This is more evident clinically than radiographically as he did not have a maximum extension lateral view obtained.    Pulses: 2+ and symmetric Skin: normal Neurologic: Grossly normal    Assessment/Plan Impression: Chronic instability to the RCL of the right thumb  Plan: To the OR for reconstruction of the RCL of the right thumb with APL graft.The procedure, risks,benefits and post-op course were  discussed with the patient at length and they were in agreement with the plan.  DASNOIT,Christopher Reed 05/06/2013, 12:55 PM  H&P documentation: 05/07/2013  -History and Physical Reviewed  -Patient has been re-examined  -No change in the plan of care  Wyn Forster, MD

## 2013-05-07 ENCOUNTER — Ambulatory Visit (HOSPITAL_BASED_OUTPATIENT_CLINIC_OR_DEPARTMENT_OTHER)
Admission: RE | Admit: 2013-05-07 | Discharge: 2013-05-07 | Disposition: A | Discharge status: Home / Self Care | Payer: 59 | Source: Ambulatory Visit | Attending: Orthopedic Surgery | Admitting: Orthopedic Surgery

## 2013-05-07 ENCOUNTER — Ambulatory Visit (HOSPITAL_BASED_OUTPATIENT_CLINIC_OR_DEPARTMENT_OTHER): Payer: 59 | Admitting: Anesthesiology

## 2013-05-07 ENCOUNTER — Encounter (HOSPITAL_BASED_OUTPATIENT_CLINIC_OR_DEPARTMENT_OTHER)
Admission: RE | Disposition: A | Discharge status: Home / Self Care | Payer: Self-pay | Source: Ambulatory Visit | Attending: Orthopedic Surgery

## 2013-05-07 ENCOUNTER — Encounter (HOSPITAL_BASED_OUTPATIENT_CLINIC_OR_DEPARTMENT_OTHER): Payer: Self-pay | Admitting: *Deleted

## 2013-05-07 ENCOUNTER — Encounter (HOSPITAL_BASED_OUTPATIENT_CLINIC_OR_DEPARTMENT_OTHER): Payer: Self-pay | Admitting: Anesthesiology

## 2013-05-07 DIAGNOSIS — D1610 Benign neoplasm of short bones of unspecified upper limb: Secondary | ICD-10-CM | POA: Insufficient documentation

## 2013-05-07 DIAGNOSIS — G40909 Epilepsy, unspecified, not intractable, without status epilepticus: Secondary | ICD-10-CM | POA: Insufficient documentation

## 2013-05-07 DIAGNOSIS — M12549 Traumatic arthropathy, unspecified hand: Secondary | ICD-10-CM | POA: Insufficient documentation

## 2013-05-07 DIAGNOSIS — F411 Generalized anxiety disorder: Secondary | ICD-10-CM | POA: Insufficient documentation

## 2013-05-07 HISTORY — PX: LIGAMENT REPAIR: SHX5444

## 2013-05-07 LAB — POCT HEMOGLOBIN-HEMACUE: Hemoglobin: 16.9 g/dL (ref 13.0–17.0)

## 2013-05-07 SURGERY — REPAIR, LIGAMENT
Anesthesia: General | Site: Hand | Laterality: Right | Wound class: Clean

## 2013-05-07 MED ORDER — FENTANYL CITRATE 0.05 MG/ML IJ SOLN
50.0000 ug | INTRAMUSCULAR | Status: DC | PRN
  Administered 2013-05-07: 50 ug via INTRAVENOUS

## 2013-05-07 MED ORDER — LACTATED RINGERS IV SOLN
INTRAVENOUS | Status: DC
  Administered 2013-05-07: 10 mL/h via INTRAVENOUS
  Administered 2013-05-07 (×2): via INTRAVENOUS

## 2013-05-07 MED ORDER — CEFAZOLIN SODIUM-DEXTROSE 2-3 GM-% IV SOLR
INTRAVENOUS | Status: DC | PRN
  Administered 2013-05-07: 2 g via INTRAVENOUS

## 2013-05-07 MED ORDER — DEXAMETHASONE SODIUM PHOSPHATE 10 MG/ML IJ SOLN
INTRAMUSCULAR | Status: DC | PRN
  Administered 2013-05-07: 10 mg via INTRAVENOUS

## 2013-05-07 MED ORDER — PROMETHAZINE HCL 25 MG/ML IJ SOLN
6.2500 mg | INTRAMUSCULAR | Status: DC | PRN
  Administered 2013-05-07: 6.25 mg via INTRAVENOUS

## 2013-05-07 MED ORDER — CHLORHEXIDINE GLUCONATE 4 % EX LIQD: 60.0000 mL | Freq: Once | CUTANEOUS | Status: DC

## 2013-05-07 MED ORDER — HYDROMORPHONE HCL PF 1 MG/ML IJ SOLN: 0.2500 mg | INTRAMUSCULAR | Status: DC | PRN

## 2013-05-07 MED ORDER — OXYCODONE HCL 5 MG/5ML PO SOLN: 5.0000 mg | Freq: Once | ORAL | Status: DC | PRN

## 2013-05-07 MED ORDER — LIDOCAINE HCL (CARDIAC) 20 MG/ML IV SOLN
INTRAVENOUS | Status: DC | PRN
  Administered 2013-05-07: 60 mg via INTRAVENOUS

## 2013-05-07 MED ORDER — LIDOCAINE HCL 2 % IJ SOLN
INTRAMUSCULAR | Status: DC | PRN
  Administered 2013-05-07: .25 mL

## 2013-05-07 MED ORDER — HYDROMORPHONE HCL 2 MG PO TABS: 2.0000 mg | ORAL_TABLET | ORAL | Status: DC | PRN

## 2013-05-07 MED ORDER — ONDANSETRON HCL 4 MG/2ML IJ SOLN
INTRAMUSCULAR | Status: DC | PRN
  Administered 2013-05-07: 4 mg via INTRAVENOUS

## 2013-05-07 MED ORDER — OXYCODONE HCL 5 MG PO TABS: 5.0000 mg | ORAL_TABLET | Freq: Once | ORAL | Status: DC | PRN

## 2013-05-07 MED ORDER — PROPOFOL 10 MG/ML IV BOLUS
INTRAVENOUS | Status: DC | PRN
  Administered 2013-05-07: 340 mg via INTRAVENOUS

## 2013-05-07 MED ORDER — METHYLPREDNISOLONE ACETATE 40 MG/ML IJ SUSP
INTRAMUSCULAR | Status: DC | PRN
  Administered 2013-05-07: .25 mL via INTRA_ARTICULAR

## 2013-05-07 MED ORDER — LIDOCAINE-EPINEPHRINE (PF) 1.5 %-1:200000 IJ SOLN
INTRAMUSCULAR | Status: DC | PRN
  Administered 2013-05-07: 30 mL

## 2013-05-07 MED ORDER — CEPHALEXIN 500 MG PO CAPS: 500.0000 mg | ORAL_CAPSULE | Freq: Three times a day (TID) | ORAL | Status: DC

## 2013-05-07 MED ORDER — MIDAZOLAM HCL 2 MG/2ML IJ SOLN
1.0000 mg | INTRAMUSCULAR | Status: DC | PRN
  Administered 2013-05-07: 1 mg via INTRAVENOUS

## 2013-05-07 SURGICAL SUPPLY — 76 items
BANDAGE ADHESIVE 1X3 (GAUZE/BANDAGES/DRESSINGS) IMPLANT
BANDAGE ELASTIC 3 VELCRO ST LF (GAUZE/BANDAGES/DRESSINGS) ×2 IMPLANT
BANDAGE ELASTIC 4 VELCRO ST LF (GAUZE/BANDAGES/DRESSINGS) ×1 IMPLANT
BANDAGE GAUZE ELAST BULKY 4 IN (GAUZE/BANDAGES/DRESSINGS) IMPLANT
BLADE MINI RND TIP GREEN BEAV (BLADE) IMPLANT
BLADE SURG 15 STRL LF DISP TIS (BLADE) ×1 IMPLANT
BLADE SURG 15 STRL SS (BLADE) ×2
BNDG CMPR 9X4 STRL LF SNTH (GAUZE/BANDAGES/DRESSINGS) ×1
BNDG CMPR MD 5X2 ELC HKLP STRL (GAUZE/BANDAGES/DRESSINGS) ×1
BNDG ELASTIC 2 VLCR STRL LF (GAUZE/BANDAGES/DRESSINGS) ×1 IMPLANT
BNDG ESMARK 4X9 LF (GAUZE/BANDAGES/DRESSINGS) ×2 IMPLANT
BRUSH SCRUB EZ PLAIN DRY (MISCELLANEOUS) ×2 IMPLANT
CATH ROBINSON RED A/P 10FR (CATHETERS) IMPLANT
CATH ROBINSON RED A/P 12FR (CATHETERS) IMPLANT
CLOTH BEACON ORANGE TIMEOUT ST (SAFETY) ×2 IMPLANT
CORDS BIPOLAR (ELECTRODE) ×2 IMPLANT
COVER MAYO STAND STRL (DRAPES) ×2 IMPLANT
COVER TABLE BACK 60X90 (DRAPES) ×2 IMPLANT
CUFF TOURNIQUET SINGLE 18IN (TOURNIQUET CUFF) ×2 IMPLANT
DECANTER SPIKE VIAL GLASS SM (MISCELLANEOUS) ×2 IMPLANT
DRAPE EXTREMITY T 121X128X90 (DRAPE) ×2 IMPLANT
DRAPE OEC MINIVIEW 54X84 (DRAPES) IMPLANT
DRAPE SURG 17X23 STRL (DRAPES) ×2 IMPLANT
GAUZE XEROFORM 1X8 LF (GAUZE/BANDAGES/DRESSINGS) ×2 IMPLANT
GLOVE BIO SURGEON STRL SZ 6.5 (GLOVE) ×2 IMPLANT
GLOVE BIOGEL M STRL SZ7.5 (GLOVE) ×2 IMPLANT
GLOVE BIOGEL PI IND STRL 7.0 (GLOVE) IMPLANT
GLOVE BIOGEL PI INDICATOR 7.0 (GLOVE) ×2
GLOVE ECLIPSE 7.0 STRL STRAW (GLOVE) ×1 IMPLANT
GLOVE ORTHO TXT STRL SZ7.5 (GLOVE) ×2 IMPLANT
GOWN BRE IMP PREV XXLGXLNG (GOWN DISPOSABLE) ×4 IMPLANT
GOWN PREVENTION PLUS XLARGE (GOWN DISPOSABLE) ×3 IMPLANT
NDL ADDISON D1/2 CIR (NEEDLE) IMPLANT
NDL HYPO 25X1 1.5 SAFETY (NEEDLE) ×1 IMPLANT
NDL KEITH (NEEDLE) IMPLANT
NDL KEITH SZ10 STRAIGHT (NEEDLE) IMPLANT
NEEDLE 27GAX1X1/2 (NEEDLE) ×2 IMPLANT
NEEDLE ADDISON D1/2 CIR (NEEDLE) IMPLANT
NEEDLE HYPO 25X1 1.5 SAFETY (NEEDLE) ×2 IMPLANT
NEEDLE KEITH (NEEDLE) IMPLANT
NEEDLE KEITH SZ10 STRAIGHT (NEEDLE) IMPLANT
NS IRRIG 1000ML POUR BTL (IV SOLUTION) ×2 IMPLANT
PACK BASIN DAY SURGERY FS (CUSTOM PROCEDURE TRAY) ×2 IMPLANT
PAD CAST 3X4 CTTN HI CHSV (CAST SUPPLIES) ×1 IMPLANT
PADDING CAST ABS 4INX4YD NS (CAST SUPPLIES) ×1
PADDING CAST ABS COTTON 4X4 ST (CAST SUPPLIES) ×1 IMPLANT
PADDING CAST COTTON 3X4 STRL (CAST SUPPLIES) ×2
PASSER SUT SWANSON 36MM LOOP (INSTRUMENTS) IMPLANT
SLEEVE SCD COMPRESS KNEE MED (MISCELLANEOUS) ×2 IMPLANT
SLING ARM FOAM STRAP XLG (SOFTGOODS) ×1 IMPLANT
SPLINT PLASTER CAST XFAST 3X15 (CAST SUPPLIES) IMPLANT
SPLINT PLASTER XTRA FASTSET 3X (CAST SUPPLIES)
SPONGE GAUZE 4X4 12PLY (GAUZE/BANDAGES/DRESSINGS) ×2 IMPLANT
STOCKINETTE 4X48 STRL (DRAPES) ×2 IMPLANT
STRIP CLOSURE SKIN 1/2X4 (GAUZE/BANDAGES/DRESSINGS) ×2 IMPLANT
SUT ETHIBOND 3-0 V-5 (SUTURE) IMPLANT
SUT ETHILON 5 0 P 3 18 (SUTURE)
SUT FIBERWIRE 3-0 18 TAPR NDL (SUTURE) ×2
SUT MERSILENE 4 0 P 3 (SUTURE) ×1 IMPLANT
SUT MERSILENE 6 0 P 1 (SUTURE) IMPLANT
SUT NYLON ETHILON 5-0 P-3 1X18 (SUTURE) IMPLANT
SUT POLY BUTTON 15MM (SUTURE) IMPLANT
SUT PROLENE 2 0 SH DA (SUTURE) IMPLANT
SUT PROLENE 3 0 PS 2 (SUTURE) ×2 IMPLANT
SUT SILK 4 0 PS 2 (SUTURE) IMPLANT
SUT VIC AB 0 CT1 27 (SUTURE)
SUT VIC AB 0 CT1 27XBRD ANBCTR (SUTURE) IMPLANT
SUT VIC AB 4-0 P-3 18XBRD (SUTURE) IMPLANT
SUT VIC AB 4-0 P3 18 (SUTURE) ×2
SUTURE FIBERWR 3-0 18 TAPR NDL (SUTURE) IMPLANT
SYR 3ML 23GX1 SAFETY (SYRINGE) IMPLANT
SYR BULB 3OZ (MISCELLANEOUS) ×2 IMPLANT
SYR CONTROL 10ML LL (SYRINGE) ×2 IMPLANT
TOWEL OR 17X24 6PK STRL BLUE (TOWEL DISPOSABLE) ×2 IMPLANT
TRAY DSU PREP LF (CUSTOM PROCEDURE TRAY) ×2 IMPLANT
UNDERPAD 30X30 INCONTINENT (UNDERPADS AND DIAPERS) ×2 IMPLANT

## 2013-05-07 NOTE — Anesthesia Postprocedure Evaluation (Signed)
  Anesthesia Post-op Note  Patient: Christopher Reed  Procedure(s) Performed: Procedure(s): cheilectomy, microfracture of metacarpal head surface right thumb (Right)  Patient Location: PACU  Anesthesia Type:GA combined with regional for post-op pain  Level of Consciousness: awake and alert   Airway and Oxygen Therapy: Patient Spontanous Breathing  Post-op Pain: none  Post-op Assessment: Post-op Vital signs reviewed  Post-op Vital Signs: stable  Complications: No apparent anesthesia complications

## 2013-05-07 NOTE — Transfer of Care (Signed)
Immediate Anesthesia Transfer of Care Note  Patient: Christopher Reed  Procedure(s) Performed: Procedure(s): cheilectomy, microfracture of metacarpal head surface right thumb (Right)  Patient Location: PACU  Anesthesia Type:GA combined with regional for post-op pain  Level of Consciousness: awake and patient cooperative  Airway & Oxygen Therapy: Patient Spontanous Breathing and Patient connected to face mask oxygen  Post-op Assessment: Report given to PACU RN and Post -op Vital signs reviewed and stable  Post vital signs: Reviewed and stable  Complications: No apparent anesthesia complications

## 2013-05-07 NOTE — Progress Notes (Signed)
Assisted Dr. Massagee with right, ultrasound guided, supraclavicular block. Side rails up, monitors on throughout procedure. See vital signs in flow sheet. Tolerated Procedure well. 

## 2013-05-07 NOTE — Brief Op Note (Signed)
05/07/2013  11:57 AM  PATIENT:  Christopher Reed  22 y.o. male  PRE-OPERATIVE DIAGNOSIS:  RIGHT THUMB RCL DISRUPTION MP JOINT  POST-OPERATIVE DIAGNOSIS:  tramatic arthritis of metaphalangeal joint right thumb  PROCEDURE:  Procedure(s): cheilectomy, microfracture of metacarpal head surface right thumb (Right)  SURGEON:  Surgeon(s) and Role:    * Wyn Forster., MD - Primary  PHYSICIAN ASSISTANT:   ASSISTANTS: [email or web address]    ANESTHESIA:   general  EBL:  Total I/O In: 1000 [I.V.:1000] Out: -   BLOOD ADMINISTERED:none  DRAINS: none   LOCAL MEDICATIONS USED:  XYLOCAINE   SPECIMEN:  No Specimen  DISPOSITION OF SPECIMEN:  N/A  COUNTS:  YES  TOURNIQUET:  * Missing tourniquet times found for documented tourniquets in log:  [id number] *  DICTATION: .Other Dictation: Dictation Number [id number]  PLAN OF CARE: Discharge to home after PACU  PATIENT DISPOSITION:  PACU - hemodynamically stable.   Delay start of Pharmacological VTE agent (>24hrs) due to surgical blood loss or risk of bleeding: not applicable

## 2013-05-07 NOTE — Anesthesia Preprocedure Evaluation (Signed)
Anesthesia Evaluation  Patient identified by MRN, date of birth, ID band Patient awake    Reviewed: Allergy & Precautions, H&P , NPO status , Patient's Chart, lab work & pertinent test results  Airway Mallampati: I      Dental  (+) Teeth Intact   Pulmonary  breath sounds clear to auscultation        Cardiovascular negative cardio ROS  Rhythm:Regular Rate:Normal     Neuro/Psych Seizures -,     GI/Hepatic negative GI ROS, Neg liver ROS,   Endo/Other  negative endocrine ROS  Renal/GU negative Renal ROS     Musculoskeletal  (+) Arthritis -,   Abdominal   Peds  Hematology negative hematology ROS (+)   Anesthesia Other Findings   Reproductive/Obstetrics                           Anesthesia Physical Anesthesia Plan  ASA: II  Anesthesia Plan: General   Post-op Pain Management:    Induction: Intravenous  Airway Management Planned: LMA  Additional Equipment:   Intra-op Plan:   Post-operative Plan: Extubation in OR  Informed Consent: I have reviewed the patients History and Physical, chart, labs and discussed the procedure including the risks, benefits and alternatives for the proposed anesthesia with the patient or authorized representative who has indicated his/her understanding and acceptance.     Plan Discussed with: CRNA and Surgeon  Anesthesia Plan Comments:         Anesthesia Quick Evaluation

## 2013-05-07 NOTE — Op Note (Signed)
[id number] 

## 2013-05-07 NOTE — Anesthesia Procedure Notes (Addendum)
Anesthesia Regional Block:  Supraclavicular block  Pre-Anesthetic Checklist: ,, timeout performed, Correct Patient, Correct Site, Correct Laterality, Correct Procedure, Correct Position, site marked, Risks and benefits discussed,  Surgical consent,  Pre-op evaluation,  At surgeon's request and post-op pain management  Laterality: Right and Upper  Prep: chloraprep       Needles:   Needle Type: Echogenic Needle      Needle Gauge: 22 and 22 G  Needle insertion depth: 5 cm   Additional Needles:  Procedures: ultrasound guided (picture in chart) Supraclavicular block Narrative:  Start time: 05/07/2013 10:12 AM End time: 05/07/2013 10:25 AM Injection made incrementally with aspirations every 5 mL.  Performed by: Personally  Anesthesiologist: T Massagee  Additional Notes: Tolerated well   Procedure Name: LMA Insertion Date/Time: 05/07/2013 10:59 AM Performed by: Burna Cash Pre-anesthesia Checklist: Patient identified, Emergency Drugs available, Suction available and Patient being monitored Patient Re-evaluated:Patient Re-evaluated prior to inductionOxygen Delivery Method: Circle System Utilized Preoxygenation: Pre-oxygenation with 100% oxygen Intubation Type: IV induction Ventilation: Mask ventilation without difficulty LMA: LMA inserted LMA Size: 5.0 Number of attempts: 1 Placement Confirmation: positive ETCO2 Tube secured with: Tape Dental Injury: Teeth and Oropharynx as per pre-operative assessment

## 2013-05-08 ENCOUNTER — Encounter (HOSPITAL_BASED_OUTPATIENT_CLINIC_OR_DEPARTMENT_OTHER): Payer: Self-pay | Admitting: Orthopedic Surgery

## 2013-05-08 NOTE — Op Note (Signed)
NAME:  Christopher Reed, Christopher Reed NO.:  [id number]  MEDICAL RECORD NO.:  [id number]  LOCATION:                                 FACILITY:  PHYSICIAN:  Christopher Reed, M.D.      DATE OF BIRTH:  DATE OF PROCEDURE:  05/07/2013 DATE OF DISCHARGE:  05/07/2013                              OPERATIVE REPORT   PREOPERATIVE DIAGNOSIS:  Laxity of radial collateral ligament and dorsal capsule of right thumb metacarpophalangeal joint with 2+ instability on stress testing and pain with preoperative plain films demonstrating minimal flattening of dorsal surface of metacarpal head.  POSTOPERATIVE DIAGNOSIS:  Posttraumatic arthropathy involving dorsal 25% of metacarpal head with depressed central fracture of the metacarpal head that was chronic with grade 4 chondromalacia.  OPERATION: 1. Exploration of right thumb metacarpophalangeal joint. 2. Cheilectomy of metacarpal head. 3. Microfracture of metacarpal head articular surface followed by: 4. Imbrication of dorsal capsule and radial collateral ligament, right thumb     metacarpophalangeal joint.  SURGEON:  Christopher Fitch. Adarian Bur, M.D.  ASSISTANT:  Christopher Reed, P.A.  ANESTHESIA:  General by LMA.  SUPERVISING ANESTHESIOLOGIST:  Christopher Reed, M.D.  INDICATIONS:  Christopher Reed is a 22 year old gentleman who presented for evaluation of a painful right thumb.  He reported a history of sustaing a significant direct compression injury to his right thumb metacarpal phalangeal joint playing basketball in high school.  He had marked pain for many months.  Since that time, with strenuous use of the right thumb, he has had pain perceived dorsally and radially.  He presented for evaluation of his thumb predicament.  He reported a recent strain exacerbating his symptoms.  Clinical examination revealed inability to hyperextend the thumb MP joint and a slight palmar subluxation of the thumb proximal phalanx off of the metacarpal head.  He  had plain films demonstrating slight flattening of the dorsal surface of the metacarpal head, but no evidence of joint space narrowing or significant osteophyte formation on the dorsal or radial margins of the articular surface.  We advised to consider exploration and possible repair or reconstruction of the radial collateral ligament.  Preoperatively, he was advised as to the potential risks and benefits of surgery.  Questions invited  and answered in detail.  He was interviewed by Dr. Gelene Mink of Anesthesia and had a ropivacaine, right plexus block placed with ultrasound guidance preoperatively.  DESCRIPTION OF PROCEDURE:  Christopher Reed was brought to room 1 of the [facility], placed in supine position upon the operating table.  Following induction of general anesthesia by LMA technique, examination of the right thumb under anesthesia was accomplished.  At 0 degree flexion, he had 10 degrees of radial deviation stress instability at neutral versus 15 degrees to ulnar deviation stress.  At 30 degrees flexion, he had 10 degrees of ulnar deviation stress instability versus 10 degrees at 30 degrees of flexion.  He still could not extend beyond neutral.  We then proceeded to prep the right hand and arm with Betadine soap solution and  drape with sterile stockinette and impervious arthroscopy drapes.  Two grams of Ancef were administered as an IV prophylactic antibiotic.  After exsanguination of the  right hand and arm with an Esmarch bandage, the arterial tourniquet on the proximal brachium was inflated to 220 mmHg. Following routine surgical time-out, we proceeded to create a dorsal curvilinear incision exposing the extensor mechanism and MP joint capsule.  Transverse veins were electrocauterized with bipolar forceps.  An extensor tenotomy was accomplished on the radial aspect of the extensor pollicis brevis.  The capsule was carefully inspected.  By palpation, I could  feel a cartilaginous osteophyte on the dorsal surface of the metacarpal head. I performed a capsulotomy elevating the capsule off the metacarpal head and neck dorsally and radially.  The radial collateral ligament was not disturbed.  We carefully inspected the joint surface. We identified what appeared to be a chronic depressed fracture of the dorsal third of the metacarpal head.  It was depressed about 1.5 mm.  In the central aspect of this depression, there was full thickness hyaline cartilage loss and a dorsal osteophyte was forming with fibrocartilage.  This would explain Christopher Reed's discomfort and extension block.  This was not evident on plain films preoperatively.  We re-consulted our plain films and identified a congruous articular surface with at least 2 mm of hyaline articular cartilage space.  The fact that this was a depressed fracture rendered this challenging to visualize short of employing CT scan or arthrogram.  At this point, we elected to not proceed with collateral ligament reconstruction or capsulorrhaphy and rather decided to perform a salvage procedure which in a 22 year old would include cheilectomy and microfracture of the metacarpal head.  A sharp 4 mm osteotome was used to meticulously remove the marginal osteophytes followed by use of a 28-gauge Kirschner wire to repeatedly microfracture the metacarpal head.  I infiltrated the joint with mixture of Depo-Medrol 40 mg/mL and 1 mL of 2% plain lidocaine.  We then performed dorsal capsulorrhaphy imbricating the dorsal capsule.  The extensor mechanism was then reconstructed with figure-of-eight sutures of 4-0 Mersilene.  The skin was repaired with subcutaneous 4-0 Vicryl and intradermal 3-0 Prolene.  Christopher Reed was placed in a forearm based thumb spica splint.  There were no apparent complications.  However, the depressed fracture was not anticipated.  We will change our strategy towards salvage and should  he have disabling pain at a later date, arthrodesis of the MP joint would be indicated.  For after care, he was provided a prescription for Dilaudid 2 mg 1-2 tablets p.o. q.4-6 h. p.r.n. pain.  We will also provide Keflex 500 mg 1 p.o. q.8 h. x4 days as a prophylactic antibiotic.     Christopher Reed, M.D.     RVS/MEDQ  D:  05/07/2013  T:  05/07/2013  Job:  [id number]

## 2013-07-17 ENCOUNTER — Ambulatory Visit (INDEPENDENT_AMBULATORY_CARE_PROVIDER_SITE_OTHER): Payer: 59

## 2013-07-17 DIAGNOSIS — Z23 Encounter for immunization: Secondary | ICD-10-CM

## 2013-09-01 ENCOUNTER — Encounter: Payer: Self-pay | Admitting: Family

## 2013-09-01 ENCOUNTER — Ambulatory Visit (INDEPENDENT_AMBULATORY_CARE_PROVIDER_SITE_OTHER): Payer: 59 | Admitting: Family

## 2013-09-01 VITALS — BP 122/74 | HR 76 | Ht 70.5 in | Wt 201.8 lb

## 2013-09-01 DIAGNOSIS — F411 Generalized anxiety disorder: Secondary | ICD-10-CM

## 2013-09-01 DIAGNOSIS — G40309 Generalized idiopathic epilepsy and epileptic syndromes, not intractable, without status epilepticus: Secondary | ICD-10-CM

## 2013-09-01 DIAGNOSIS — G250 Essential tremor: Secondary | ICD-10-CM

## 2013-09-01 DIAGNOSIS — F419 Anxiety disorder, unspecified: Secondary | ICD-10-CM

## 2013-09-01 DIAGNOSIS — R404 Transient alteration of awareness: Secondary | ICD-10-CM

## 2013-09-01 NOTE — Progress Notes (Signed)
Patient: Christopher Reed MRN: [id number] Sex: male DOB: [birth year]  Provider: Elveria Rising, NP Location of Care: [facility]  Note type: Routine return visit  History of Present Illness: Referral Source: Dr. Corwin Levins History from: patient and his mother and sister Chief Complaint: Seizures  Christopher Reed is a 22 y.o. male with history of seizures. He had onset of seizures December 14, 2001 that were generalized tonic-clonic in nature. He is taking and tolerating Lamictal, and is compliant with his medication. His last tonic-clonic seizure occurred on Mar 11, 2013. He has ongoing daily staring spells that are concerning to his family. His mother and sister say that he has a vacant stare and that he is unresponsive to them when they try to get his attention. His family says that these spells occur several times per day. It is not clear that these events represent seizures. We have discussed EMU evaluation previously because the episodes occur every day, and his parents now want to proceed with that plan.   Christopher Reed also has an intermittent benign essential tremor that is noted most often when he is tired or under stress. His mother feels that the tremor is worse in one hand than the other. He has some problems with anxiety. He has had surgery on his thumb since last seen, but has been otherwise healthy. Christopher Reed works part-time at a [employer] account [employer]. He does not drive.   Review of Systems: 12 system review was unremarkable  Past Medical History  Diagnosis Date  . SINUSITIS- ACUTE-NOS 09/25/2010  . UTI 11/09/2009  . TESTICULAR MASS 11/09/2009  . Anxiety   . Seizures     epilepsy since age 76yrs   Hospitalizations: no, Head Injury: no, Nervous System Infections: no, Immunizations up to date: yes Past Medical History Comments: Patient had a diagnosis of attention deficit disorder and also had learning differences that required a 504 plan related to difficulty  listening and taking notes and tremor which interfered with his writing.  EEG December 13, 2003 was normal in the waking state.  EEG April 02, 2009 showed frequent bursts of 4 second 3Hz  generalized 120 V spike, 101 120 V slow-wave activity unit otherwise normal background EEG June 10, 2011 showed frontally predominant generalized spike and slow wave discharges that were at times pseudoperiodic with sleep. In comparison with the previous record  that showed a normal waking state, this is abnormal on the basis of the interictal activity. The slowing in the background may reflect the sleep deprivation to the patient.   Surgical History Past Surgical History  Procedure Laterality Date  . Knee surgury  03/2006    Right knee - meniscus tear/minor ACL tear  . Ankle arthroscopy Left   . Toe surgery Right   . Myringotomy with tube placement      as child  . Ligament repair Right 05/07/2013    Procedure: cheilectomy, microfracture of metacarpal head surface right thumb;  Surgeon: Wyn Forster., MD;  Location: [facility];  Service: Orthopedics;  Laterality: Right;    Family History family history includes Arthritis in his other; Diabetes in his other; Hepatitis C in his paternal grandmother; Hypertension in his other; Other in his maternal grandfather; Pneumonia in his paternal grandfather. Family History is negative migraines, seizures, cognitive impairment, blindness, deafness, birth defects, chromosomal disorder, autism.  Social History History   Social History  . Marital Status: Single    Spouse Name: N/A    Number of  Children: N/A  . Years of Education: N/A   Social History Main Topics  . Smoking status: Never Smoker   . Smokeless tobacco: Never Used  . Alcohol Use: No  . Drug Use: No  . Sexual Activity: No   Other Topics Concern  . None   Social History Narrative  . 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  Current Outpatient Prescriptions on File Prior to Visit  Medication Sig Dispense Refill  . lamoTRIgine (LAMICTAL) 200 MG tablet Take 3 by mouth every morning and 3 by mouth every evening. (BRAND NAME MEDICALLY NECESSARY)  540 tablet  3  . cephALEXin (KEFLEX) 500 MG capsule Take 1 capsule (500 mg total) by mouth 3 (three) times daily.  12 capsule  0  . HYDROmorphone (DILAUDID) 2 MG tablet Take 1 tablet (2 mg total) by mouth every 4 (four) hours as needed for pain (take one or two tablets every 4 to 6 hours as needed for surgical pain).  30 tablet  0   No current facility-administered medications on file prior to visit.   The medication list was reviewed and reconciled. All changes or newly prescribed medications were explained.  A complete medication list was provided to the patient/caregiver.  No Known Allergies  Physical Exam BP 122/74  Pulse 76  Ht 5' 10.5" (1.791 m)  Wt 201 lb 12.8 oz (91.536 kg)  BMI 28.54 kg/m2 General: alert, well developed, well nourished young man, in no acute distress, right-handed  Head: normocephalic, no dysmorphic features  Ears, Nose and Throat: Otoscopic: tympanic membranes normal . Pharynx: oropharynx is pink without exudates or tonsillar hypertrophy.  Neck: supple, full range of motion, no cranial or cervical bruits  Respiratory: auscultation clear  Cardiovascular: no murmurs, pulses are normal  Musculoskeletal: no skeletal deformities or apparent scoliosis  Skin: no rashes or neurocutaneous lesions   Neurologic Exam  Mental Status: alert; oriented to person, place, and year; knowledge is normal for age; language is normal  Cranial Nerves: visual fields are full to double simultaneous stimuli; extraocular movements are full and conjugate; pupils are round reactive to light; funduscopic examination shows sharp disc margins with normal vessels; symmetric facial  strength; midline tongue and uvula; hearing is normal and symmetric  Motor: Normal strength, tone, and mass; good fine motor movements; no pronator drift. Barely perceptible bilateral hand tremor Sensory: intact responses to touch and temperature  Coordination: good finger-to-nose, rapid repetitive alternating movements and finger apposition  Gait and Station: normal gait and station; patient is able to walk on heels, toes and tandem without difficulty; balance is adequate; Romberg exam is negative; Gower response is negative  Reflexes: symmetric and diminished bilaterally; no clonus; bilateral flexor plantar responses.   Assessment and Plan Christopher Reed is a 22 year old young man with history of seizures. He is taking and tolerating brand Lamictal. His most recent seizure occurred Mar 11, 2013, and his Lamictal dose was increased at that time. Christopher Reed is experiencing daily episodes of unresponsive staring, but it is not clear that these events represent seizures. I will refer him to [facility]U for monitoring and evaluation. I will see Christopher Reed and his family back in follow up after his visit to EMU to review the results.

## 2013-09-04 ENCOUNTER — Encounter: Payer: Self-pay | Admitting: Family

## 2013-09-04 NOTE — Patient Instructions (Signed)
Continue your medication without change for now.  We will refer you to [facility] to the Epilepsy Monitoring Unit for evaluation. I will see you back here a few weeks after the visit to [facility] to review the results.  Call me if you have any questions or concerns.

## 2013-11-11 DIAGNOSIS — G40909 Epilepsy, unspecified, not intractable, without status epilepticus: Secondary | ICD-10-CM | POA: Insufficient documentation

## 2013-12-29 ENCOUNTER — Telehealth: Payer: Self-pay

## 2013-12-29 DIAGNOSIS — Z0000 Encounter for general adult medical examination without abnormal findings: Secondary | ICD-10-CM

## 2013-12-29 NOTE — Telephone Encounter (Signed)
cpx labs entered  

## 2014-03-03 ENCOUNTER — Other Ambulatory Visit: Payer: Self-pay | Admitting: Internal Medicine

## 2014-03-03 DIAGNOSIS — G40909 Epilepsy, unspecified, not intractable, without status epilepticus: Secondary | ICD-10-CM

## 2014-03-05 ENCOUNTER — Other Ambulatory Visit (INDEPENDENT_AMBULATORY_CARE_PROVIDER_SITE_OTHER): Payer: 59

## 2014-03-05 DIAGNOSIS — G40909 Epilepsy, unspecified, not intractable, without status epilepticus: Secondary | ICD-10-CM

## 2014-03-05 DIAGNOSIS — Z0000 Encounter for general adult medical examination without abnormal findings: Secondary | ICD-10-CM

## 2014-03-05 LAB — BASIC METABOLIC PANEL
BUN: 16 mg/dL (ref 6–23)
CALCIUM: 9.7 mg/dL (ref 8.4–10.5)
CO2: 31 mEq/L (ref 19–32)
Chloride: 104 mEq/L (ref 96–112)
Creatinine, Ser: 1.1 mg/dL (ref 0.4–1.5)
GFR: 86.97 mL/min (ref >60.00)
Glucose, Bld: 94 mg/dL (ref 70–99)
POTASSIUM: 4.4 meq/L (ref 3.5–5.1)
SODIUM: 141 meq/L (ref 135–145)

## 2014-03-05 LAB — CBC WITH DIFFERENTIAL/PLATELET
Basophils Absolute: 0 10*3/uL (ref 0.0–0.1)
Basophils Relative: 0.5 % (ref 0.0–3.0)
EOS ABS: 0.1 10*3/uL (ref 0.0–0.7)
Eosinophils Relative: 2.1 % (ref 0.0–5.0)
HEMATOCRIT: 48.2 % (ref 39.0–52.0)
Hemoglobin: 16.5 g/dL (ref 13.0–17.0)
LYMPHS ABS: 1.3 10*3/uL (ref 0.7–4.0)
Lymphocytes Relative: 29.4 % (ref 12.0–46.0)
MCHC: 34.3 g/dL (ref 30.0–36.0)
MCV: 96.2 fl (ref 78.0–100.0)
MONO ABS: 0.5 10*3/uL (ref 0.1–1.0)
Monocytes Relative: 10.3 % (ref 3.0–12.0)
NEUTROS PCT: 57.7 % (ref 43.0–77.0)
Neutro Abs: 2.6 10*3/uL (ref 1.4–7.7)
PLATELETS: 238 10*3/uL (ref 150.0–400.0)
RBC: 5.01 Mil/uL (ref 4.22–5.81)
RDW: 12.6 % (ref 11.5–15.5)
WBC: 4.5 10*3/uL (ref 4.0–10.5)

## 2014-03-05 LAB — URINALYSIS, ROUTINE W REFLEX MICROSCOPIC
Bilirubin Urine: NEGATIVE
Hgb urine dipstick: NEGATIVE
KETONES UR: NEGATIVE
LEUKOCYTES UA: NEGATIVE
Nitrite: NEGATIVE
PH: 7 (ref 5.0–8.0)
SPECIFIC GRAVITY, URINE: 1.02 (ref 1.000–1.030)
Total Protein, Urine: NEGATIVE
UROBILINOGEN UA: 0.2 (ref 0.0–1.0)
Urine Glucose: NEGATIVE

## 2014-03-05 LAB — TSH: TSH: 0.99 u[IU]/mL (ref 0.35–4.50)

## 2014-03-05 LAB — LIPID PANEL
CHOL/HDL RATIO: 4
CHOLESTEROL: 155 mg/dL (ref 0–200)
HDL: 41.7 mg/dL (ref >39.00)
LDL CALC: 102 mg/dL — AB (ref 0–99)
Triglycerides: 56 mg/dL (ref 0.0–149.0)
VLDL: 11.2 mg/dL (ref 0.0–40.0)

## 2014-03-05 LAB — HEPATIC FUNCTION PANEL
ALBUMIN: 4.7 g/dL (ref 3.5–5.2)
ALK PHOS: 61 U/L (ref 39–117)
ALT: 19 U/L (ref 0–53)
AST: 13 U/L (ref 0–37)
BILIRUBIN TOTAL: 0.8 mg/dL (ref 0.2–1.2)
Bilirubin, Direct: 0.1 mg/dL (ref 0.0–0.3)
Total Protein: 7.6 g/dL (ref 6.0–8.3)

## 2014-03-09 LAB — ETHOSUXIMIDE LEVEL: ETHOSUXIMIDE LVL: 38 mg/L — AB (ref 40–100)

## 2014-03-09 LAB — LAMOTRIGINE LEVEL: LAMOTRIGINE LVL: 6.3 ug/mL (ref 4.0–18.0)

## 2014-03-11 ENCOUNTER — Telehealth: Payer: Self-pay

## 2014-03-11 NOTE — Telephone Encounter (Signed)
Faxed lab results to [facility], Dr. Lu Duffel, Attn: Epilepsy RN to [phone].

## 2014-03-19 ENCOUNTER — Telehealth: Payer: Self-pay

## 2014-03-19 NOTE — Telephone Encounter (Signed)
All approp labs were done Mar 05, 2014  No need other labs at this time

## 2014-03-19 NOTE — Telephone Encounter (Signed)
The patients mom called to inform patient is schedule to see Dr. Jenny Reichmann on Tuesday at 1.  He had labs recently at his neurologist.  If he needs more labs for appt. With Dr. Jenny Reichmann please order and will need to call the mother as she can bring him in on Monday to do.  Call back number [phone]

## 2014-03-23 ENCOUNTER — Encounter: Payer: 59 | Admitting: Internal Medicine

## 2014-04-14 ENCOUNTER — Ambulatory Visit (INDEPENDENT_AMBULATORY_CARE_PROVIDER_SITE_OTHER): Payer: 59 | Admitting: Internal Medicine

## 2014-04-14 ENCOUNTER — Encounter: Payer: Self-pay | Admitting: Internal Medicine

## 2014-04-14 VITALS — BP 108/72 | HR 70 | Temp 98.8°F | Ht 70.0 in | Wt 201.8 lb

## 2014-04-14 DIAGNOSIS — Z0000 Encounter for general adult medical examination without abnormal findings: Secondary | ICD-10-CM

## 2014-04-14 NOTE — Progress Notes (Signed)
Pre visit review using our clinic review tool, if applicable. No additional management support is needed unless otherwise documented below in the visit note. 

## 2014-04-14 NOTE — Assessment & Plan Note (Signed)

## 2014-04-14 NOTE — Patient Instructions (Signed)
Please continue all other medications as before, and refills have been done if requested.  Please have the pharmacy call with any other refills you may need.  Please continue your efforts at being more active, low cholesterol diet, and weight control.  You are otherwise up to date with prevention measures today.  Please keep your appointments with your specialists as you may have planned  Please return in 1 year for your yearly visit, or sooner if needed, with Lab testing done 3-5 days before  

## 2014-04-14 NOTE — Progress Notes (Signed)
Subjective:    Patient ID: Christopher Reed, male    DOB: 12/01/1990, 23 y.o.   MRN: [id number]  HPI  Here for wellness and f/u;  Overall doing ok;  Pt denies CP, worsening SOB, DOE, wheezing, orthopnea, PND, worsening LE edema, palpitations, dizziness or syncope.  Pt denies neurological change such as new headache, facial or extremity weakness.  Pt denies polydipsia, polyuria, or low sugar symptoms. Pt states overall good compliance with treatment and medications, good tolerability, and has been trying to follow lower cholesterol diet.  Pt denies worsening depressive symptoms, suicidal ideation or panic. No fever, night sweats, wt loss, loss of appetite, or other constitutional symptoms.  Pt states good ability with ADL's, has low fall risk, home safety reviewed and adequate, no other significant changes in hearing or vision, and only occasionally active with exercise.  Sees local neurology twice per yr, but also seen [facility] with 3 days stay with monitoring/EEG; meds adjusted.  Last sz approx 1 mo ago,had med blood levels done, lamictal incresed in the PM.  No further sz since that time.    Past Medical History  Diagnosis Date  . SINUSITIS- ACUTE-NOS 09/25/2010  . UTI 11/09/2009  . TESTICULAR MASS 11/09/2009  . Anxiety   . Seizures     epilepsy since age 67yrs   Past Surgical History  Procedure Laterality Date  . Knee surgury  03/2006    Right knee - meniscus tear/minor ACL tear  . Ankle arthroscopy Left   . Toe surgery Right   . Myringotomy with tube placement      as child  . Ligament repair Right 05/07/2013    Procedure: cheilectomy, microfracture of metacarpal head surface right thumb;  Surgeon: Cammie Sickle., MD;  Location: [facility];  Service: Orthopedics;  Laterality: Right;    reports that he has never smoked. He has never used smokeless tobacco. He reports that he does not drink alcohol or use illicit drugs. family history includes Arthritis in his other; Diabetes  in his other; Hepatitis C in his paternal grandmother; Hypertension in his other; Other in his maternal grandfather; Pneumonia in his paternal grandfather. No Known Allergies Current Outpatient Prescriptions on File Prior to Visit  Medication Sig Dispense Refill  . lamoTRIgine (LAMICTAL) 200 MG tablet Take 3 by mouth every morning and 3 by mouth every evening. (BRAND NAME MEDICALLY NECESSARY)  540 tablet  3   No current facility-administered medications on file prior to visit.    Review of Systems Constitutional: Negative for increased diaphoresis, other activity, appetite or other siginficant weight change  HENT: Negative for worsening hearing loss, ear pain, facial swelling, mouth sores and neck stiffness.   Eyes: Negative for other worsening pain, redness or visual disturbance.  Respiratory: Negative for shortness of breath and wheezing.   Cardiovascular: Negative for chest pain and palpitations.  Gastrointestinal: Negative for diarrhea, blood in stool, abdominal distention or other pain Genitourinary: Negative for hematuria, flank pain or change in urine volume.  Musculoskeletal: Negative for myalgias or other joint complaints.  Skin: Negative for color change and wound.  Neurological: Negative for syncope and numbness. other than noted Hematological: Negative for adenopathy. or other swelling Psychiatric/Behavioral: Negative for hallucinations, self-injury, decreased concentration or other worsening agitation.      Objective:   Physical Exam BP 108/72  Pulse 70  Temp(Src) 98.8 F (37.1 C) (Oral)  Ht 5\' 10"  (1.778 m)  Wt 201 lb 12 oz (91.513 kg)  BMI 28.95  kg/m2  SpO2 98% VS noted,  Constitutional: Pt is oriented to person, place, and time. Appears well-developed and well-nourished.  Head: Normocephalic and atraumatic.  Right Ear: External ear normal.  Left Ear: External ear normal.  Nose: Nose normal.  Mouth/Throat: Oropharynx is clear and moist.  Eyes: Conjunctivae and  EOM are normal. Pupils are equal, round, and reactive to light.  Neck: Normal range of motion. Neck supple. No JVD present. No tracheal deviation present.  Cardiovascular: Normal rate, regular rhythm, normal heart sounds and intact distal pulses.   Pulmonary/Chest: Effort normal and breath sounds without rales or wheezing  Abdominal: Soft. Bowel sounds are normal. NT. No HSM  Musculoskeletal: Normal range of motion. Exhibits no edema.  Lymphadenopathy:  Has no cervical adenopathy.  Neurological: Pt is alert and oriented to person, place, and time. Pt has normal reflexes. No cranial nerve deficit. Motor grossly intact Skin: Skin is warm and dry. No rash noted.  Psychiatric:  Has normal mood and affect. Behavior is normal.     Assessment & Plan:

## 2014-04-21 ENCOUNTER — Other Ambulatory Visit: Payer: Self-pay | Admitting: Orthopedic Surgery

## 2014-04-21 DIAGNOSIS — M25511 Pain in right shoulder: Secondary | ICD-10-CM

## 2014-04-28 ENCOUNTER — Ambulatory Visit
Admission: RE | Admit: 2014-04-28 | Discharge: 2014-04-28 | Disposition: A | Discharge status: Home / Self Care | Payer: 59 | Source: Ambulatory Visit | Attending: Orthopedic Surgery | Admitting: Orthopedic Surgery

## 2014-04-28 DIAGNOSIS — M25511 Pain in right shoulder: Secondary | ICD-10-CM

## 2014-06-10 ENCOUNTER — Encounter: Payer: Self-pay | Admitting: Internal Medicine

## 2014-06-10 ENCOUNTER — Ambulatory Visit (INDEPENDENT_AMBULATORY_CARE_PROVIDER_SITE_OTHER): Payer: 59 | Admitting: Internal Medicine

## 2014-06-10 VITALS — BP 114/78 | HR 75 | Temp 98.9°F | Ht 71.0 in | Wt 211.1 lb

## 2014-06-10 DIAGNOSIS — T783XXA Angioneurotic edema, initial encounter: Secondary | ICD-10-CM

## 2014-06-10 MED ORDER — METHYLPREDNISOLONE ACETATE 80 MG/ML IJ SUSP
80.0000 mg | Freq: Once | INTRAMUSCULAR | Status: AC
  Administered 2014-06-10: 80 mg via INTRAMUSCULAR

## 2014-06-10 MED ORDER — EPINEPHRINE 0.3 MG/0.3ML IJ SOAJ: 0.3000 mg | Freq: Once | INTRAMUSCULAR | Status: DC

## 2014-06-10 MED ORDER — PREDNISONE 10 MG PO TABS: ORAL_TABLET | ORAL | Status: DC

## 2014-06-10 NOTE — Progress Notes (Signed)
Pre visit review using our clinic review tool, if applicable. No additional management support is needed unless otherwise documented below in the visit note. 

## 2014-06-10 NOTE — Progress Notes (Signed)
   Subjective:    Patient ID: Christopher Reed, male    DOB: 25-May-1991, 23 y.o.   MRN: [id number]  HPI   Here to f/u visit with mother present, c/o bee sting x 3 recent, first 2 mo ago and resolved, then more recent 2 by yellowjacket nest in front yard 3 days ago, one to left arm , one to right, now with c/o diffuse swelling/tightness to right arm below the elbow, with itch but no red/pain, no lip or tongue or throat swelling, no sob. Pt denies chest pain, increased sob or doe, wheezing, orthopnea, PND, increased LE swelling, palpitations, dizziness or syncope.  Past Medical History  Diagnosis Date  . SINUSITIS- ACUTE-NOS 09/25/2010  . UTI 11/09/2009  . TESTICULAR MASS 11/09/2009  . Anxiety   . Seizures     epilepsy since age 41yrs   Past Surgical History  Procedure Laterality Date  . Knee surgury  03/2006    Right knee - meniscus tear/minor ACL tear  . Ankle arthroscopy Left   . Toe surgery Right   . Myringotomy with tube placement      as child  . Ligament repair Right 05/07/2013    Procedure: cheilectomy, microfracture of metacarpal head surface right thumb;  Surgeon: Cammie Sickle., MD;  Location: [place];  Service: Orthopedics;  Laterality: Right;    reports that he has never smoked. He has never used smokeless tobacco. He reports that he does not drink alcohol or use illicit drugs. family history includes Arthritis in his other; Diabetes in his other; Hepatitis C in his paternal grandmother; Hypertension in his other; Other in his maternal grandfather; Pneumonia in his paternal grandfather. Allergies  Allergen Reactions  . Bee Venom     angioedema   Current Outpatient Prescriptions on File Prior to Visit  Medication Sig Dispense Refill  . ethosuximide (ZARONTIN) 250 MG capsule Take by mouth 4 (four) times daily.        No current facility-administered medications on file prior to visit.   Review of Systems  All otherwise neg per pt       Objective:     Physical Exam BP 114/78  Pulse 75  Temp(Src) 98.9 F (37.2 C) (Oral)  Ht 5\' 11"  (1.803 m)  Wt 211 lb 2 oz (95.766 kg)  BMI 29.46 kg/m2  SpO2 98% VS noted,  Constitutional: Pt appears well-developed, well-nourished.  HENT: Head: NCAT.  Right Ear: External ear normal.  Left Ear: External ear normal.  Eyes: . Pupils are equal, round, and reactive to light. Conjunctivae and EOM are normal Neck: Normal range of motion. Neck supple.  Cardiovascular: Normal rate and regular rhythm.   Pulmonary/Chest: Effort normal and breath sounds normal.  Neurological: Pt is alert. Not confused , motor grossly intact Skin: right arm with diffuse nontender edema below elbow with arm and hand, o/w neurovasc intact. Psychiatric: Pt behavior is normal. No agitation.     Assessment & Plan:

## 2014-06-10 NOTE — Patient Instructions (Signed)
You had the steroid shot today  Please take all new medication as prescribed - the prednisone, as well as the Epi-pen if needed in future  Please continue all other medications as before, and refills have been done if requested.  Please have the pharmacy call with any other refills you may need.

## 2014-06-13 NOTE — Assessment & Plan Note (Signed)
Allergic rxn due to new bee sting allergy, now on allergy list, for depomedrol IM, predpac asd,  to f/u any worsening symptoms or concerns, benadryl po prn

## 2014-06-17 DIAGNOSIS — Z0279 Encounter for issue of other medical certificate: Secondary | ICD-10-CM

## 2014-07-13 ENCOUNTER — Ambulatory Visit: Payer: 59

## 2014-07-20 ENCOUNTER — Ambulatory Visit: Payer: 59

## 2014-07-30 ENCOUNTER — Ambulatory Visit (INDEPENDENT_AMBULATORY_CARE_PROVIDER_SITE_OTHER): Payer: 59

## 2014-07-30 DIAGNOSIS — Z23 Encounter for immunization: Secondary | ICD-10-CM

## 2015-02-10 ENCOUNTER — Telehealth: Payer: Self-pay | Admitting: Internal Medicine

## 2015-02-10 NOTE — Telephone Encounter (Signed)
Received medical records from [facility]. Sent to Dr. Jenny Reichmann. 02/10/15/ss

## 2015-05-19 ENCOUNTER — Other Ambulatory Visit (INDEPENDENT_AMBULATORY_CARE_PROVIDER_SITE_OTHER): Payer: 59

## 2015-05-19 DIAGNOSIS — Z0000 Encounter for general adult medical examination without abnormal findings: Secondary | ICD-10-CM | POA: Diagnosis not present

## 2015-05-19 LAB — HEPATIC FUNCTION PANEL
ALBUMIN: 4.8 g/dL (ref 3.5–5.2)
ALT: 18 U/L (ref 0–53)
AST: 12 U/L (ref 0–37)
Alkaline Phosphatase: 68 U/L (ref 39–117)
BILIRUBIN TOTAL: 0.8 mg/dL (ref 0.2–1.2)
Bilirubin, Direct: 0.2 mg/dL (ref 0.0–0.3)
TOTAL PROTEIN: 7.4 g/dL (ref 6.0–8.3)

## 2015-05-19 LAB — LIPID PANEL
CHOLESTEROL: 135 mg/dL (ref 0–200)
HDL: 41 mg/dL (ref >39.00)
LDL Cholesterol: 76 mg/dL (ref 0–99)
NONHDL: 94.29
TRIGLYCERIDES: 90 mg/dL (ref 0.0–149.0)
Total CHOL/HDL Ratio: 3
VLDL: 18 mg/dL (ref 0.0–40.0)

## 2015-05-19 LAB — URINALYSIS, ROUTINE W REFLEX MICROSCOPIC
Bilirubin Urine: NEGATIVE
Hgb urine dipstick: NEGATIVE
Ketones, ur: NEGATIVE
LEUKOCYTES UA: NEGATIVE
NITRITE: NEGATIVE
Specific Gravity, Urine: 1.025 (ref 1.000–1.030)
Urine Glucose: NEGATIVE
Urobilinogen, UA: 0.2 (ref 0.0–1.0)
pH: 6 (ref 5.0–8.0)

## 2015-05-19 LAB — CBC WITH DIFFERENTIAL/PLATELET
BASOS PCT: 0.7 % (ref 0.0–3.0)
Basophils Absolute: 0 10*3/uL (ref 0.0–0.1)
EOS ABS: 0.1 10*3/uL (ref 0.0–0.7)
EOS PCT: 2.6 % (ref 0.0–5.0)
HCT: 47.2 % (ref 39.0–52.0)
Hemoglobin: 16.2 g/dL (ref 13.0–17.0)
LYMPHS PCT: 25.2 % (ref 12.0–46.0)
Lymphs Abs: 1.2 10*3/uL (ref 0.7–4.0)
MCHC: 34.3 g/dL (ref 30.0–36.0)
MCV: 95.9 fl (ref 78.0–100.0)
Monocytes Absolute: 0.5 10*3/uL (ref 0.1–1.0)
Monocytes Relative: 9.7 % (ref 3.0–12.0)
NEUTROS PCT: 61.8 % (ref 43.0–77.0)
Neutro Abs: 2.9 10*3/uL (ref 1.4–7.7)
PLATELETS: 230 10*3/uL (ref 150.0–400.0)
RBC: 4.92 Mil/uL (ref 4.22–5.81)
RDW: 12.3 % (ref 11.5–15.5)
WBC: 4.7 10*3/uL (ref 4.0–10.5)

## 2015-05-19 LAB — BASIC METABOLIC PANEL
BUN: 13 mg/dL (ref 6–23)
CALCIUM: 9.7 mg/dL (ref 8.4–10.5)
CHLORIDE: 103 meq/L (ref 96–112)
CO2: 31 meq/L (ref 19–32)
CREATININE: 1.28 mg/dL (ref 0.40–1.50)
GFR: 73.03 mL/min (ref >60.00)
GLUCOSE: 87 mg/dL (ref 70–99)
Potassium: 4 mEq/L (ref 3.5–5.1)
Sodium: 141 mEq/L (ref 135–145)

## 2015-05-19 LAB — TSH: TSH: 1.51 u[IU]/mL (ref 0.35–4.50)

## 2015-05-27 ENCOUNTER — Ambulatory Visit (INDEPENDENT_AMBULATORY_CARE_PROVIDER_SITE_OTHER): Payer: 59 | Admitting: Internal Medicine

## 2015-05-27 ENCOUNTER — Encounter: Payer: Self-pay | Admitting: Internal Medicine

## 2015-05-27 VITALS — BP 110/68 | HR 75 | Temp 99.2°F | Ht 71.0 in | Wt 201.0 lb

## 2015-05-27 DIAGNOSIS — Z0000 Encounter for general adult medical examination without abnormal findings: Secondary | ICD-10-CM | POA: Diagnosis not present

## 2015-05-27 NOTE — Assessment & Plan Note (Signed)
Overall doing well, age appropriate education and counseling updated, referrals for preventative services and immunizations addressed, dietary and smoking counseling addressed, most recent labs reviewed.  I have personally reviewed and have noted:  1) the patient's medical and social history 2) The pt's use of alcohol, tobacco, and illicit drugs 3) The patient's current medications and supplements 4) Functional ability including ADL's, fall risk, home safety risk, hearing and visual impairment 5) Diet and physical activities 6) Evidence for depression or mood disorder 7) The patient's height, weight, and BMI have been recorded in the chart  I have made referrals, and provided counseling and education based on review of the above Lab Results  Component Value Date   WBC 4.7 05/19/2015   HGB 16.2 05/19/2015   HCT 47.2 05/19/2015   PLT 230.0 05/19/2015   GLUCOSE 87 05/19/2015   CHOL 135 05/19/2015   TRIG 90.0 05/19/2015   HDL 41.00 05/19/2015   LDLCALC 76 05/19/2015   ALT 18 05/19/2015   AST 12 05/19/2015   NA 141 05/19/2015   K 4.0 05/19/2015   CL 103 05/19/2015   CREATININE 1.28 05/19/2015   BUN 13 05/19/2015   CO2 31 05/19/2015   TSH 1.51 05/19/2015

## 2015-05-27 NOTE — Patient Instructions (Signed)
Please continue all other medications as before, and refills have been done if requested.  Please have the pharmacy call with any other refills you may need.  Please continue your efforts at being more active, low cholesterol diet, and weight control.  You are otherwise up to date with prevention measures today.  Please keep your appointments with your specialists as you may have planned  Please return in 1 year for your yearly visit, or sooner if needed, with Lab testing done 3-5 days before  

## 2015-05-27 NOTE — Progress Notes (Signed)
Pre visit review using our clinic review tool, if applicable. No additional management support is needed unless otherwise documented below in the visit note. 

## 2015-05-27 NOTE — Progress Notes (Signed)
Subjective:    Patient ID: Christopher Reed, male    DOB: August 26, 1991, 24 y.o.   MRN: [id number]  HPI  Here for wellness and f/u;  Overall doing ok;  Pt denies Chest pain, worsening SOB, DOE, wheezing, orthopnea, PND, worsening LE edema, palpitations, dizziness or syncope.  Pt denies neurological change such as new headache, facial or extremity weakness.  Pt denies polydipsia, polyuria, or low sugar symptoms. Pt states overall good compliance with treatment and medications, good tolerability, and has been trying to follow appropriate diet.  Pt denies worsening depressive symptoms, suicidal ideation or panic. No fever, night sweats, wt loss, loss of appetite, or other constitutional symptoms.  Pt states good ability with ADL's, has low fall risk, home safety reviewed and adequate, no other significant changes in hearing or vision, and only occasionally active with exercise.  No current complaints Past Medical History  Diagnosis Date  . SINUSITIS- ACUTE-NOS 09/25/2010  . UTI 11/09/2009  . TESTICULAR MASS 11/09/2009  . Anxiety   . Seizures     epilepsy since age 29yrs   Past Surgical History  Procedure Laterality Date  . Knee surgury  03/2006    Right knee - meniscus tear/minor ACL tear  . Ankle arthroscopy Left   . Toe surgery Right   . Myringotomy with tube placement      as child  . Ligament repair Right 05/07/2013    Procedure: cheilectomy, microfracture of metacarpal head surface right thumb;  Surgeon: Cammie Sickle., MD;  Location: [facility];  Service: Orthopedics;  Laterality: Right;    reports that he has never smoked. He has never used smokeless tobacco. He reports that he does not drink alcohol or use illicit drugs. family history includes Arthritis in his other; Diabetes in his other; Hepatitis C in his paternal grandmother; Hypertension in his other; Other in his maternal grandfather; Pneumonia in his paternal grandfather. Allergies  Allergen Reactions  . Bee  Venom     angioedema   Current Outpatient Prescriptions on File Prior to Visit  Medication Sig Dispense Refill  . ethosuximide (ZARONTIN) 250 MG capsule Take by mouth 4 (four) times daily.     Marland Kitchen lamoTRIgine (LAMICTAL) 200 MG tablet Take 2 by mouth every morning and 3 by mouth every evening. (BRAND NAME MEDICALLY NECESSARY)    . EPINEPHrine (EPIPEN) 0.3 mg/0.3 mL IJ SOAJ injection Inject 0.3 mLs (0.3 mg total) into the muscle once. (Patient not taking: Reported on 05/27/2015) 2 Device 1  . predniSONE (DELTASONE) 10 MG tablet 3 tabs by mouth per day for 3 days,2tabs per day for 3 days,1tab per day for 3 days (Patient not taking: Reported on 05/27/2015) 18 tablet 0   No current facility-administered medications on file prior to visit.    Review of Systems Constitutional: Negative for increased diaphoresis, other activity, appetite or siginficant weight change other than noted HENT: Negative for worsening hearing loss, ear pain, facial swelling, mouth sores and neck stiffness.   Eyes: Negative for other worsening pain, redness or visual disturbance.  Respiratory: Negative for shortness of breath and wheezing  Cardiovascular: Negative for chest pain and palpitations.  Gastrointestinal: Negative for diarrhea, blood in stool, abdominal distention or other pain Genitourinary: Negative for hematuria, flank pain or change in urine volume.  Musculoskeletal: Negative for myalgias or other joint complaints.  Skin: Negative for color change and wound or drainage.  Neurological: Negative for syncope and numbness. other than noted Hematological: Negative for adenopathy. or other  swelling Psychiatric/Behavioral: Negative for hallucinations, SI, self-injury, decreased concentration or other worsening agitation.      Objective:   Physical Exam BP 110/68 mmHg  Pulse 75  Temp(Src) 99.2 F (37.3 C) (Oral)  Ht 5\' 11"  (1.803 m)  Wt 201 lb (91.173 kg)  BMI 28.05 kg/m2  SpO2 99% VS noted,  Constitutional:  Pt is oriented to person, place, and time. Appears well-developed and well-nourished, in no significant distress Head: Normocephalic and atraumatic.  Right Ear: External ear normal.  Left Ear: External ear normal.  Nose: Nose normal.  Mouth/Throat: Oropharynx is clear and moist.  Eyes: Conjunctivae and EOM are normal. Pupils are equal, round, and reactive to light.  Neck: Normal range of motion. Neck supple. No JVD present. No tracheal deviation present or significant neck LA or mass Cardiovascular: Normal rate, regular rhythm, normal heart sounds and intact distal pulses.   Pulmonary/Chest: Effort normal and breath sounds without rales or wheezing  Abdominal: Soft. Bowel sounds are normal. NT. No HSM  Musculoskeletal: Normal range of motion. Exhibits no edema.  Lymphadenopathy:  Has no cervical adenopathy.  Neurological: Pt is alert and oriented to person, place, and time. Pt has normal reflexes. No cranial nerve deficit. Motor grossly intact Skin: Skin is warm and dry. No rash noted.  Psychiatric:  Has normal mood and affect. Behavior is normal.     Assessment & Plan:

## 2015-07-22 ENCOUNTER — Ambulatory Visit (INDEPENDENT_AMBULATORY_CARE_PROVIDER_SITE_OTHER): Payer: 59

## 2015-07-22 DIAGNOSIS — Z23 Encounter for immunization: Secondary | ICD-10-CM

## 2016-06-08 ENCOUNTER — Other Ambulatory Visit (INDEPENDENT_AMBULATORY_CARE_PROVIDER_SITE_OTHER): Payer: 59

## 2016-06-08 DIAGNOSIS — Z0000 Encounter for general adult medical examination without abnormal findings: Secondary | ICD-10-CM | POA: Diagnosis not present

## 2016-06-08 LAB — URINALYSIS, ROUTINE W REFLEX MICROSCOPIC
BILIRUBIN URINE: NEGATIVE
Ketones, ur: NEGATIVE
Leukocytes, UA: NEGATIVE
Nitrite: NEGATIVE
Specific Gravity, Urine: 1.025 (ref 1.000–1.030)
TOTAL PROTEIN, URINE-UPE24: 30 — AB
UROBILINOGEN UA: 0.2 (ref 0.0–1.0)
Urine Glucose: NEGATIVE
WBC UA: NONE SEEN (ref 0–?)
pH: 6.5 (ref 5.0–8.0)

## 2016-06-08 LAB — HEPATIC FUNCTION PANEL
ALT: 26 U/L (ref 0–53)
AST: 15 U/L (ref 0–37)
Albumin: 4.6 g/dL (ref 3.5–5.2)
Alkaline Phosphatase: 60 U/L (ref 39–117)
BILIRUBIN DIRECT: 0.1 mg/dL (ref 0.0–0.3)
BILIRUBIN TOTAL: 0.6 mg/dL (ref 0.2–1.2)
Total Protein: 7.1 g/dL (ref 6.0–8.3)

## 2016-06-08 LAB — CBC WITH DIFFERENTIAL/PLATELET
BASOS PCT: 0.7 % (ref 0.0–3.0)
Basophils Absolute: 0 10*3/uL (ref 0.0–0.1)
EOS PCT: 2 % (ref 0.0–5.0)
Eosinophils Absolute: 0.1 10*3/uL (ref 0.0–0.7)
HEMATOCRIT: 46 % (ref 39.0–52.0)
HEMOGLOBIN: 15.9 g/dL (ref 13.0–17.0)
LYMPHS PCT: 33.9 % (ref 12.0–46.0)
Lymphs Abs: 1.4 10*3/uL (ref 0.7–4.0)
MCHC: 34.6 g/dL (ref 30.0–36.0)
MCV: 94.1 fl (ref 78.0–100.0)
MONOS PCT: 10.2 % (ref 3.0–12.0)
Monocytes Absolute: 0.4 10*3/uL (ref 0.1–1.0)
NEUTROS ABS: 2.1 10*3/uL (ref 1.4–7.7)
Neutrophils Relative %: 53.2 % (ref 43.0–77.0)
Platelets: 231 10*3/uL (ref 150.0–400.0)
RBC: 4.89 Mil/uL (ref 4.22–5.81)
RDW: 12.4 % (ref 11.5–15.5)
WBC: 4 10*3/uL (ref 4.0–10.5)

## 2016-06-08 LAB — LIPID PANEL
CHOL/HDL RATIO: 3
Cholesterol: 136 mg/dL (ref 0–200)
HDL: 41.6 mg/dL (ref >39.00)
LDL Cholesterol: 84 mg/dL (ref 0–99)
NONHDL: 94.32
Triglycerides: 50 mg/dL (ref 0.0–149.0)
VLDL: 10 mg/dL (ref 0.0–40.0)

## 2016-06-08 LAB — BASIC METABOLIC PANEL
BUN: 14 mg/dL (ref 6–23)
CALCIUM: 9.6 mg/dL (ref 8.4–10.5)
CO2: 30 mEq/L (ref 19–32)
CREATININE: 1.15 mg/dL (ref 0.40–1.50)
Chloride: 103 mEq/L (ref 96–112)
GFR: 81.94 mL/min (ref >60.00)
Glucose, Bld: 93 mg/dL (ref 70–99)
Potassium: 4.5 mEq/L (ref 3.5–5.1)
SODIUM: 140 meq/L (ref 135–145)

## 2016-06-08 LAB — TSH: TSH: 1.34 u[IU]/mL (ref 0.35–4.50)

## 2016-06-15 ENCOUNTER — Ambulatory Visit (INDEPENDENT_AMBULATORY_CARE_PROVIDER_SITE_OTHER): Payer: 59 | Admitting: Internal Medicine

## 2016-06-15 ENCOUNTER — Encounter: Payer: Self-pay | Admitting: Internal Medicine

## 2016-06-15 ENCOUNTER — Encounter: Payer: 59 | Admitting: Internal Medicine

## 2016-06-15 VITALS — BP 122/72 | HR 69 | Temp 98.4°F | Resp 20 | Wt 199.0 lb

## 2016-06-15 DIAGNOSIS — F419 Anxiety disorder, unspecified: Secondary | ICD-10-CM

## 2016-06-15 DIAGNOSIS — Z23 Encounter for immunization: Secondary | ICD-10-CM

## 2016-06-15 DIAGNOSIS — R6889 Other general symptoms and signs: Secondary | ICD-10-CM

## 2016-06-15 DIAGNOSIS — Z0001 Encounter for general adult medical examination with abnormal findings: Secondary | ICD-10-CM | POA: Diagnosis not present

## 2016-06-15 DIAGNOSIS — R809 Proteinuria, unspecified: Secondary | ICD-10-CM | POA: Diagnosis not present

## 2016-06-15 NOTE — Patient Instructions (Addendum)
You had the flu shot today  Please continue all other medications as before, and refills have been done if requested.  Please have the pharmacy call with any other refills you may need.  Please continue your efforts at being more active, low cholesterol diet, and weight control.  You are otherwise up to date with prevention measures today.  Please keep your appointments with your specialists as you may have planned  Please go to the LAB in the Basement (turn left off the elevator) for the tests to be done today - the protein testing  You will be contacted by phone if any changes need to be made immediately.  Otherwise, you will receive a letter about your results with an explanation, but please check with MyChart first.  Please remember to sign up for MyChart if you have not done so, as this will be important to you in the future with finding out test results, communicating by private email, and scheduling acute appointments online when needed.  Please return in 1 year for your yearly visit, or sooner if needed, with Lab testing done 3-5 days before

## 2016-06-15 NOTE — Assessment & Plan Note (Signed)
stable overall by history and exam, recent data reviewed with pt, and pt to continue medical treatment as before,  to f/u any worsening symptoms or concerns Lab Results  Component Value Date   WBC 4.0 06/08/2016   HGB 15.9 06/08/2016   HCT 46.0 06/08/2016   PLT 231.0 06/08/2016   GLUCOSE 93 06/08/2016   CHOL 136 06/08/2016   TRIG 50.0 06/08/2016   HDL 41.60 06/08/2016   LDLCALC 84 06/08/2016   ALT 26 06/08/2016   AST 15 06/08/2016   NA 140 06/08/2016   K 4.5 06/08/2016   CL 103 06/08/2016   CREATININE 1.15 06/08/2016   BUN 14 06/08/2016   CO2 30 06/08/2016   TSH 1.34 06/08/2016

## 2016-06-15 NOTE — Progress Notes (Signed)
Pre visit review using our clinic review tool, if applicable. No additional management support is needed unless otherwise documented below in the visit note. 

## 2016-06-15 NOTE — Assessment & Plan Note (Signed)

## 2016-06-15 NOTE — Progress Notes (Signed)
Subjective:    Patient ID: Christopher Reed, male    DOB: December 20, 1990, 25 y.o.   MRN: [id number]  HPI  Here for wellness and f/u;  Overall doing ok;  Pt denies Chest pain, worsening SOB, DOE, wheezing, orthopnea, PND, worsening LE edema, palpitations, dizziness or syncope.  Pt denies neurological change such as new headache, facial or extremity weakness.  Pt denies polydipsia, polyuria, or low sugar symptoms. Pt states overall good compliance with treatment and medications, good tolerability, and has been trying to follow appropriate diet. . No fever, night sweats, wt loss, loss of appetite, or other constitutional symptoms.  Pt states good ability with ADL's, has low fall risk, home safety reviewed and adequate, no other significant changes in hearing or vision, and only occasionally active with exercise.   No hx of malignancy, rheum dz, DM or renal dz. No hx of prior proteinuria. Denies worsening reflux, abd pain, dysphagia, n/v, bowel change or blood.  Denies urinary symptoms such as dysuria, frequency, urgency, flank pain, hematuria or n/v, fever, chills.  Denies worsening depressive symptoms, suicidal ideation, or panic; has ongoing anxiety Past Medical History:  Diagnosis Date  . Anxiety   . Seizures ([place])    epilepsy since age 36yrs  . SINUSITIS- ACUTE-NOS 09/25/2010  . TESTICULAR MASS 11/09/2009  . UTI 11/09/2009   Past Surgical History:  Procedure Laterality Date  . ANKLE ARTHROSCOPY Left   . Knee surgury  03/2006   Right knee - meniscus tear/minor ACL tear  . LIGAMENT REPAIR Right 05/07/2013   Procedure: cheilectomy, microfracture of metacarpal head surface right thumb;  Surgeon: Cammie Sickle., MD;  Location: [facility];  Service: Orthopedics;  Laterality: Right;  . MYRINGOTOMY WITH TUBE PLACEMENT     as child  . TOE SURGERY Right     reports that he has never smoked. He has never used smokeless tobacco. He reports that he does not drink alcohol or use  drugs. family history includes Arthritis in his other; Diabetes in his other; Hepatitis C in his paternal grandmother; Hypertension in his other; Other in his maternal grandfather; Pneumonia in his paternal grandfather. Allergies  Allergen Reactions  . Bee Venom     angioedema   Current Outpatient Prescriptions on File Prior to Visit  Medication Sig Dispense Refill  . EPINEPHrine (EPIPEN) 0.3 mg/0.3 mL IJ SOAJ injection Inject 0.3 mLs (0.3 mg total) into the muscle once. 2 Device 1  . ethosuximide (ZARONTIN) 250 MG capsule Take by mouth 4 (four) times daily.     Marland Kitchen lamoTRIgine (LAMICTAL) 200 MG tablet Take 2 by mouth every morning and 3 by mouth every evening. (BRAND NAME MEDICALLY NECESSARY)     No current facility-administered medications on file prior to visit.    Review of Systems Constitutional: Negative for increased diaphoresis, or other activity, appetite or siginficant weight change other than noted HENT: Negative for worsening hearing loss, ear pain, facial swelling, mouth sores and neck stiffness.   Eyes: Negative for other worsening pain, redness or visual disturbance.  Respiratory: Negative for choking or stridor Cardiovascular: Negative for other chest pain and palpitations.  Gastrointestinal: Negative for worsening diarrhea, blood in stool, or abdominal distention Genitourinary: Negative for hematuria, flank pain or change in urine volume.  Musculoskeletal: Negative for myalgias or other joint complaints.  Skin: Negative for other color change and wound or drainage.  Neurological: Negative for syncope and numbness. other than noted Hematological: Negative for adenopathy. or other swelling Psychiatric/Behavioral: Negative  for hallucinations, SI, self-injury, decreased concentration or other worsening agitation.      Objective:   Physical Exam BP 122/72   Pulse 69   Temp 98.4 F (36.9 C) (Oral)   Resp 20   Wt 199 lb (90.3 kg)   SpO2 98%   BMI 27.75 kg/m  VS noted,   Constitutional: Pt is oriented to person, place, and time. Appears well-developed and well-nourished, in no significant distress Head: Normocephalic and atraumatic  Eyes: Conjunctivae and EOM are normal. Pupils are equal, round, and reactive to light Right Ear: External ear normal.  Left Ear: External ear normal Nose: Nose normal.  Mouth/Throat: Oropharynx is clear and moist  Neck: Normal range of motion. Neck supple. No JVD present. No tracheal deviation present or significant neck LA or mass Cardiovascular: Normal rate, regular rhythm, normal heart sounds and intact distal pulses.   Pulmonary/Chest: Effort normal and breath sounds without rales or wheezing  Abdominal: Soft. Bowel sounds are normal. NT. No HSM  Musculoskeletal: Normal range of motion. Exhibits no edema Lymphadenopathy: Has no cervical adenopathy.  Neurological: Pt is alert and oriented to person, place, and time. Pt has normal reflexes. No cranial nerve deficit. Motor grossly intact Skin: Skin is warm and dry. No rash noted or new ulcers Psychiatric:  Has 1+ nervous  mood and affect. Behavior is normal.     Assessment & Plan:

## 2016-06-15 NOTE — Assessment & Plan Note (Addendum)
Mild, for 24 hr urine total protein, UA neg for sediment, may need repeat to r/o transient proteinuria, may need r/o orthostatic related, consider renal referral  In addition to the time spent performing CPE, I spent an additional 15 minutes face to face,in which greater than 50% of this time was spent in counseling and coordination of care for patient's illness as documented.

## 2016-06-18 ENCOUNTER — Other Ambulatory Visit: Payer: 59

## 2016-06-18 DIAGNOSIS — R809 Proteinuria, unspecified: Secondary | ICD-10-CM

## 2016-06-19 LAB — PROTEIN, URINE, 24 HOUR
PROTEIN, URINE: 17 mg/dL (ref 5–25)
Protein, 24H Urine: 204 mg/24 h — ABNORMAL HIGH (ref <150)

## 2016-06-20 ENCOUNTER — Other Ambulatory Visit: Payer: Self-pay | Admitting: Internal Medicine

## 2016-06-20 ENCOUNTER — Encounter: Payer: Self-pay | Admitting: Internal Medicine

## 2016-06-20 DIAGNOSIS — R809 Proteinuria, unspecified: Secondary | ICD-10-CM

## 2016-06-26 ENCOUNTER — Telehealth: Payer: Self-pay | Admitting: Internal Medicine

## 2016-06-26 NOTE — Telephone Encounter (Signed)
Patient called back in.  Notified patient of referral. Gave MD response. Patient did not have any further questions.

## 2017-02-18 DIAGNOSIS — Z79899 Other long term (current) drug therapy: Secondary | ICD-10-CM | POA: Diagnosis not present

## 2017-02-18 DIAGNOSIS — R404 Transient alteration of awareness: Secondary | ICD-10-CM | POA: Diagnosis not present

## 2017-06-28 ENCOUNTER — Other Ambulatory Visit (INDEPENDENT_AMBULATORY_CARE_PROVIDER_SITE_OTHER): Payer: 59

## 2017-06-28 DIAGNOSIS — Z0001 Encounter for general adult medical examination with abnormal findings: Secondary | ICD-10-CM | POA: Diagnosis not present

## 2017-06-28 LAB — CBC WITH DIFFERENTIAL/PLATELET
BASOS PCT: 0.8 % (ref 0.0–3.0)
Basophils Absolute: 0 10*3/uL (ref 0.0–0.1)
EOS ABS: 0.1 10*3/uL (ref 0.0–0.7)
Eosinophils Relative: 2.3 % (ref 0.0–5.0)
HCT: 45.5 % (ref 39.0–52.0)
Hemoglobin: 15.5 g/dL (ref 13.0–17.0)
LYMPHS ABS: 1 10*3/uL (ref 0.7–4.0)
Lymphocytes Relative: 24 % (ref 12.0–46.0)
MCHC: 34.2 g/dL (ref 30.0–36.0)
MCV: 97 fl (ref 78.0–100.0)
MONO ABS: 0.4 10*3/uL (ref 0.1–1.0)
Monocytes Relative: 10.5 % (ref 3.0–12.0)
NEUTROS ABS: 2.6 10*3/uL (ref 1.4–7.7)
Neutrophils Relative %: 62.4 % (ref 43.0–77.0)
Platelets: 226 10*3/uL (ref 150.0–400.0)
RBC: 4.69 Mil/uL (ref 4.22–5.81)
RDW: 12.6 % (ref 11.5–15.5)
WBC: 4.2 10*3/uL (ref 4.0–10.5)

## 2017-06-28 LAB — URINALYSIS, ROUTINE W REFLEX MICROSCOPIC
BILIRUBIN URINE: NEGATIVE
HGB URINE DIPSTICK: NEGATIVE
Ketones, ur: NEGATIVE
Leukocytes, UA: NEGATIVE
NITRITE: NEGATIVE
Specific Gravity, Urine: 1.015 (ref 1.000–1.030)
TOTAL PROTEIN, URINE-UPE24: NEGATIVE
Urine Glucose: NEGATIVE
Urobilinogen, UA: 0.2 (ref 0.0–1.0)
WBC UA: NONE SEEN (ref 0–?)
pH: 7.5 (ref 5.0–8.0)

## 2017-06-28 LAB — BASIC METABOLIC PANEL
BUN: 14 mg/dL (ref 6–23)
CHLORIDE: 102 meq/L (ref 96–112)
CO2: 30 mEq/L (ref 19–32)
Calcium: 9.6 mg/dL (ref 8.4–10.5)
Creatinine, Ser: 1.07 mg/dL (ref 0.40–1.50)
GFR: 88.32 mL/min (ref >60.00)
Glucose, Bld: 91 mg/dL (ref 70–99)
POTASSIUM: 4.2 meq/L (ref 3.5–5.1)
SODIUM: 139 meq/L (ref 135–145)

## 2017-06-28 LAB — TSH: TSH: 1.08 u[IU]/mL (ref 0.35–4.50)

## 2017-06-28 LAB — LIPID PANEL
Cholesterol: 146 mg/dL (ref 0–200)
HDL: 38.1 mg/dL — ABNORMAL LOW (ref >39.00)
LDL Cholesterol: 93 mg/dL (ref 0–99)
NONHDL: 108.02
TRIGLYCERIDES: 75 mg/dL (ref 0.0–149.0)
Total CHOL/HDL Ratio: 4
VLDL: 15 mg/dL (ref 0.0–40.0)

## 2017-06-28 LAB — HEPATIC FUNCTION PANEL
ALK PHOS: 54 U/L (ref 39–117)
ALT: 34 U/L (ref 0–53)
AST: 17 U/L (ref 0–37)
Albumin: 4.7 g/dL (ref 3.5–5.2)
BILIRUBIN DIRECT: 0.2 mg/dL (ref 0.0–0.3)
BILIRUBIN TOTAL: 0.8 mg/dL (ref 0.2–1.2)
Total Protein: 6.9 g/dL (ref 6.0–8.3)

## 2017-07-03 ENCOUNTER — Ambulatory Visit (INDEPENDENT_AMBULATORY_CARE_PROVIDER_SITE_OTHER): Payer: 59 | Admitting: Internal Medicine

## 2017-07-03 ENCOUNTER — Encounter: Payer: Self-pay | Admitting: Internal Medicine

## 2017-07-03 VITALS — BP 114/84 | HR 74 | Temp 98.4°F | Ht 71.0 in | Wt 208.0 lb

## 2017-07-03 DIAGNOSIS — Z0000 Encounter for general adult medical examination without abnormal findings: Secondary | ICD-10-CM

## 2017-07-03 DIAGNOSIS — Z23 Encounter for immunization: Secondary | ICD-10-CM

## 2017-07-03 DIAGNOSIS — Z114 Encounter for screening for human immunodeficiency virus [HIV]: Secondary | ICD-10-CM | POA: Diagnosis not present

## 2017-07-03 NOTE — Patient Instructions (Addendum)

## 2017-07-03 NOTE — Progress Notes (Signed)
Subjective:    Patient ID: Christopher Reed, male    DOB: 19-Oct-1991, 26 y.o.   MRN: [id number]  HPI  Here for wellness and f/u;  Overall doing ok;  Pt denies Chest pain, worsening SOB, DOE, wheezing, orthopnea, PND, worsening LE edema, palpitations, dizziness or syncope.  Pt denies neurological change such as new headache, facial or extremity weakness.  Pt denies polydipsia, polyuria, or low sugar symptoms. Pt states overall good compliance with treatment and medications, good tolerability, and has been trying to follow appropriate diet.  Pt denies worsening depressive symptoms, suicidal ideation or panic. No fever, night sweats, wt loss, loss of appetite, or other constitutional symptoms.  Pt states good ability with ADL's, has low fall risk, home safety reviewed and adequate, no other significant changes in hearing or vision, occasionally active with exercise.  Has gained wt in last yr, plans to be more active Wt Readings from Last 3 Encounters:  07/03/17 208 lb (94.3 kg)  06/15/16 199 lb (90.3 kg)  05/27/15 201 lb (91.2 kg)   BP Readings from Last 3 Encounters:  07/03/17 114/84  06/15/16 122/72  05/27/15 110/68   Past Medical History:  Diagnosis Date  . Anxiety   . Seizures ([place])    epilepsy since age 8yrs  . SINUSITIS- ACUTE-NOS 09/25/2010  . TESTICULAR MASS 11/09/2009  . UTI 11/09/2009   Past Surgical History:  Procedure Laterality Date  . ANKLE ARTHROSCOPY Left   . Knee surgury  03/2006   Right knee - meniscus tear/minor ACL tear  . LIGAMENT REPAIR Right 05/07/2013   Procedure: cheilectomy, microfracture of metacarpal head surface right thumb;  Surgeon: Cammie Sickle., MD;  Location: [place];  Service: Orthopedics;  Laterality: Right;  . MYRINGOTOMY WITH TUBE PLACEMENT     as child  . TOE SURGERY Right     reports that he has never smoked. He has never used smokeless tobacco. He reports that he does not drink alcohol or use drugs. family history  includes Arthritis in his other; Diabetes in his other; Hepatitis C in his paternal grandmother; Hypertension in his other; Other in his maternal grandfather; Pneumonia in his paternal grandfather. Allergies  Allergen Reactions  . Bee Venom     angioedema   Current Outpatient Prescriptions on File Prior to Visit  Medication Sig Dispense Refill  . EPINEPHrine (EPIPEN) 0.3 mg/0.3 mL IJ SOAJ injection Inject 0.3 mLs (0.3 mg total) into the muscle once. 2 Device 1  . ethosuximide (ZARONTIN) 250 MG capsule Take by mouth 4 (four) times daily.     Marland Kitchen lamoTRIgine (LAMICTAL) 200 MG tablet Take 2 by mouth every morning and 3 by mouth every evening. (BRAND NAME MEDICALLY NECESSARY)     No current facility-administered medications on file prior to visit.    Review of Systems Constitutional: Negative for other unusual diaphoresis, sweats, appetite or weight changes HENT: Negative for other worsening hearing loss, ear pain, facial swelling, mouth sores or neck stiffness.   Eyes: Negative for other worsening pain, redness or other visual disturbance.  Respiratory: Negative for other stridor or swelling Cardiovascular: Negative for other palpitations or other chest pain  Gastrointestinal: Negative for worsening diarrhea or loose stools, blood in stool, distention or other pain Genitourinary: Negative for hematuria, flank pain or other change in urine volume.  Musculoskeletal: Negative for myalgias or other joint swelling.  Skin: Negative for other color change, or other wound or worsening drainage.  Neurological: Negative for other syncope or  numbness. Hematological: Negative for other adenopathy or swelling Psychiatric/Behavioral: Negative for hallucinations, other worsening agitation, SI, self-injury, or new decreased concentration All other system neg per pt    Objective:   Physical Exam BP 114/84   Pulse 74   Temp 98.4 F (36.9 C) (Oral)   Ht 5\' 11"  (1.803 m)   Wt 208 lb (94.3 kg)   SpO2 99%    BMI 29.01 kg/m  VS noted,  Constitutional: Pt is oriented to person, place, and time. Appears well-developed and well-nourished, in no significant distress and comfortable Head: Normocephalic and atraumatic  Eyes: Conjunctivae and EOM are normal. Pupils are equal, round, and reactive to light Right Ear: External ear normal without discharge Left Ear: External ear normal without discharge Nose: Nose without discharge or deformity Mouth/Throat: Oropharynx is without other ulcerations and moist  Neck: Normal range of motion. Neck supple. No JVD present. No tracheal deviation present or significant neck LA or mass Cardiovascular: Normal rate, regular rhythm, normal heart sounds and intact distal pulses.   Pulmonary/Chest: WOB normal and breath sounds without rales or wheezing  Abdominal: Soft. Bowel sounds are normal. NT. No HSM  Musculoskeletal: Normal range of motion. Exhibits no edema Lymphadenopathy: Has no other cervical adenopathy.  Neurological: Pt is alert and oriented to person, place, and time. Pt has normal reflexes. No cranial nerve deficit. Motor grossly intact, Gait intact Skin: Skin is warm and dry. No rash noted or new ulcerations Psychiatric:  Has nervous mood and affect. Behavior is normal without agitation No other exam findings     Assessment & Plan:

## 2017-07-06 NOTE — Assessment & Plan Note (Signed)

## 2017-07-08 ENCOUNTER — Ambulatory Visit (INDEPENDENT_AMBULATORY_CARE_PROVIDER_SITE_OTHER): Payer: 59 | Admitting: Diagnostic Neuroimaging

## 2017-07-08 ENCOUNTER — Encounter: Payer: Self-pay | Admitting: *Deleted

## 2017-07-08 VITALS — BP 128/74 | HR 84 | Ht 71.0 in | Wt 207.6 lb

## 2017-07-08 DIAGNOSIS — G40309 Generalized idiopathic epilepsy and epileptic syndromes, not intractable, without status epilepticus: Secondary | ICD-10-CM | POA: Diagnosis not present

## 2017-07-08 MED ORDER — ETHOSUXIMIDE 250 MG PO CAPS: 250.0000 mg | ORAL_CAPSULE | Freq: Two times a day (BID) | ORAL | 4 refills | Status: DC

## 2017-07-08 MED ORDER — LAMOTRIGINE 200 MG PO TABS: ORAL_TABLET | ORAL | 4 refills | Status: DC

## 2017-07-08 NOTE — Patient Instructions (Signed)
Thank you for coming to see [place] at [facility]. I hope we have been able to provide you high quality care today.  You may receive a patient satisfaction survey over the next few weeks. We would appreciate your feedback and comments so that we may continue to improve ourselves and the health of our patients.  - continue current medications  - keep log of staring spells; see if calling name or light touch / tap can stop the staring spells   ~~~~~~~~~~~~~~~~~~~~~~~~~~~~~~~~~~~~~~~~~~~~~~~~~~~~~~~~~~~~~~~~~  DR. PENUMALLI'S GUIDE TO HAPPY AND HEALTHY LIVING These are some of my general health and wellness recommendations. Some of them may apply to you better than others. Please use common sense as you try these suggestions and feel free to ask me any questions.   ACTIVITY/FITNESS Mental, social, emotional and physical stimulation are very important for brain and body health. Try learning a new activity (arts, music, language, sports, games).  Keep moving your body to the best of your abilities. You can do this at home, inside or outside, the park, community center, gym or anywhere you like. Consider a physical therapist or personal trainer to get started. Consider the app Sworkit. Fitness trackers such as smart-watches, smart-phones or Fitbits can help as well.   NUTRITION Eat more plants: colorful vegetables, nuts, seeds and berries.  Eat less sugar, salt, preservatives and processed foods.  Avoid toxins such as cigarettes and alcohol.  Drink water when you are thirsty. Warm water with a slice of lemon is an excellent morning drink to start the day.  Consider these websites for more information The Nutrition Source (https://www.henry-hernandez.biz/) Precision Nutrition (WindowBlog.ch)   RELAXATION Consider practicing mindfulness meditation or other relaxation techniques such as deep breathing, prayer, yoga, tai chi,  massage. See website mindful.org or the apps Headspace or Calm to help get started.   SLEEP Try to get at least 7-8+ hours sleep per day. Regular exercise and reduced caffeine will help you sleep better. Practice good sleep hygeine techniques. See website sleep.org for more information.   PLANNING Prepare estate planning, living will, healthcare POA documents. Sometimes this is best planned with the help of an attorney. Theconversationproject.org and agingwithdignity.org are excellent resources.

## 2017-07-08 NOTE — Progress Notes (Signed)
[facility]  PATIENT: Christopher Reed DOB: [birth year]  REFERRING CLINICIAN: Karleen Hampshire, MD HISTORY FROM: patient  REASON FOR VISIT: new consult    HISTORICAL  CHIEF COMPLAINT:  Chief Complaint  Patient presents with  . NP  O"Donovan  . Seizures    HISTORY OF PRESENT ILLNESS:   26 year old right-handed male here for evaluation of seizure disorder.  Patient had normal birth and development. At age 17 years old he had intermittent episodes of seizures. Patient would have staring spells as well as grand mal seizures. He would have intermittent muscle jerks. Over the years he was treated with Lamictal and this dose was increased over time. Last grand mal seizure was run 2014.  He was then developing continued problems with intermittent daily staring spells. Therefore he was referred to [facility] video EEG for evaluation in January 2015. During the admission his Lamictal was decreased from 600 mg twice a day down to 300 mg twice a day. Patient had several stereotypical events which were confirmed to be correlated with electrographic seizure activity. He was discharged on Lamictal 400 mg twice a day with ethosuxamide 250mg  twice a day. Patient was tried on Keppra but they could not tolerate side effects. Modified Atkins diet was considered. VNS was considered. In 2016 Dr. Ginny Forth raised possibility of trying Depakote to help with better seizure control, but patient and family declined due to potential side effects.  Patient also had home sleep study which ruled out sleep apnea.  Patient had a ambulatory EEG in November 2017 which showed generalized spike and wave discharges. Patient had one staring spell without EEG correlate. Therefore it was concluded that patient staring spells may not be related to epileptic discharges. Patient last seen at [facility] epilepsy clinic in April 2018.  Now patient presenting here to establish with local neurologist.  Family is  concerned about his ongoing daily staring spells, tremors, joint pain, difficulty in working and living independently.    REVIEW OF SYSTEMS: Full 14 system review of systems performed and negative with exception of: Seizures joint pain.  ALLERGIES: Allergies  Allergen Reactions  . Bee Venom     angioedema    HOME MEDICATIONS: Outpatient Medications Prior to Visit  Medication Sig Dispense Refill  . EPINEPHrine (EPIPEN) 0.3 mg/0.3 mL IJ SOAJ injection Inject 0.3 mLs (0.3 mg total) into the muscle once. 2 Device 1  . ethosuximide (ZARONTIN) 250 MG capsule Take 250 mg by mouth 2 (two) times daily.     Marland Kitchen lamoTRIgine (LAMICTAL) 200 MG tablet Take 2 by mouth every morning and 3.5 by mouth every evening. (BRAND NAME MEDICALLY NECESSARY)     No facility-administered medications prior to visit.     PAST MEDICAL HISTORY: Past Medical History:  Diagnosis Date  . Anxiety   . Seizures ([place])    epilepsy since age 89yrs  . SINUSITIS- ACUTE-NOS 09/25/2010  . TESTICULAR MASS 11/09/2009  . UTI 11/09/2009    PAST SURGICAL HISTORY: Past Surgical History:  Procedure Laterality Date  . ANKLE ARTHROSCOPY Left   . DG THUMB RIGHT HAND ([place] HX)     2-3 yrs ago 2015 or 2016  . Knee surgury  03/2006   Right knee - meniscus tear/minor ACL tear  . LIGAMENT REPAIR Right 05/07/2013   Procedure: cheilectomy, microfracture of metacarpal head surface right thumb;  Surgeon: Cammie Sickle., MD;  Location: [facility];  Service: Orthopedics;  Laterality: Right;  . MYRINGOTOMY WITH TUBE PLACEMENT  as child  . TOE SURGERY Right     FAMILY HISTORY: Family History  Problem Relation Age of Onset  . Diabetes Other        1st degree relative  . Arthritis Other   . Hypertension Other   . Other Maternal Grandfather        Spinal Meningitis-Died at 59  . Hepatitis C Paternal Grandmother        Died at 57  . Pneumonia Paternal Grandfather        Died at 49  . Hypertension Mother   .  Hypertension Father   . Diabetes Father        type 2    SOCIAL HISTORY:  Social History   Social History  . Marital status: Single    Spouse name: N/A  . Number of children: N/A  . Years of education: N/A   Occupational History  . Not on file.   Social History Main Topics  . Smoking status: Never Smoker  . Smokeless tobacco: Never Used  . Alcohol use No  . Drug use: No  . Sexual activity: No   Other Topics Concern  . Not on file   Social History Narrative   Lives at home with parents.  Works at [employer].  HS Education.  Pt is single and no children.  Caffeine 2 cups avg when drinks.       PHYSICAL EXAM  GENERAL EXAM/CONSTITUTIONAL: Vitals:  Vitals:   07/08/17 0952  BP: 128/74  Pulse: 84  Weight: 207 lb 9.6 oz (94.2 kg)  Height: 5\' 11"  (1.803 m)     Body mass index is 28.95 kg/m.  No exam data present  Patient is in no distress; well developed, nourished and groomed; neck is supple  CARDIOVASCULAR:  Examination of carotid arteries is normal; no carotid bruits  Regular rate and rhythm, no murmurs  Examination of peripheral vascular system by observation and palpation is normal  EYES:  Ophthalmoscopic exam of optic discs and posterior segments is normal; no papilledema or hemorrhages  MUSCULOSKELETAL:  Gait, strength, tone, movements noted in Neurologic exam below  NEUROLOGIC: MENTAL STATUS:  No flowsheet data found.  awake, alert, oriented to person, place and time  recent and remote memory intact  normal attention and concentration  language fluent, comprehension intact, naming intact,   fund of knowledge appropriate  CRANIAL NERVE:   2nd - no papilledema on fundoscopic exam  2nd, 3rd, 4th, 6th - pupils equal and reactive to light, visual fields full to confrontation, extraocular muscles intact, no nystagmus  5th - facial sensation symmetric  7th - facial strength symmetric  8th - hearing intact  9th - palate  elevates symmetrically, uvula midline  11th - shoulder shrug symmetric  12th - tongue protrusion midline  MOTOR:   normal bulk and tone, full strength in the BUE, BLE  MINIMAL POSTURAL AND ACTION TREMOR IN BUE  SENSORY:   normal and symmetric to light touch, temperature, vibration  COORDINATION:   finger-nose-finger, fine finger movements normal  REFLEXES:   deep tendon reflexes present and symmetric  GAIT/STATION:   narrow based gait; able to walk tandem; romberg is negative    DIAGNOSTIC DATA (LABS, IMAGING, TESTING) - I reviewed patient records, labs, notes, testing and imaging myself where available.  Lab Results  Component Value Date   WBC 4.2 06/28/2017   HGB 15.5 06/28/2017   HCT 45.5 06/28/2017   MCV 97.0 06/28/2017   PLT 226.0 06/28/2017  Component Value Date/Time   NA 139 06/28/2017 0818   K 4.2 06/28/2017 0818   CL 102 06/28/2017 0818   CO2 30 06/28/2017 0818   GLUCOSE 91 06/28/2017 0818   BUN 14 06/28/2017 0818   CREATININE 1.07 06/28/2017 0818   CALCIUM 9.6 06/28/2017 0818   PROT 6.9 06/28/2017 0818   ALBUMIN 4.7 06/28/2017 0818   AST 17 06/28/2017 0818   ALT 34 06/28/2017 0818   ALKPHOS 54 06/28/2017 0818   BILITOT 0.8 06/28/2017 0818   GFRNONAA 92.72 09/21/2010 0849   GFRAA 127 10/06/2008 0821   Lab Results  Component Value Date   CHOL 146 06/28/2017   HDL 38.10 (L) 06/28/2017   LDLCALC 93 06/28/2017   TRIG 75.0 06/28/2017   CHOLHDL 4 06/28/2017   No results found for: HGBA1C No results found for: VITAMINB12 Lab Results  Component Value Date   TSH 1.08 06/28/2017      06/07/11 EEG - Abnormal EEG on the basis of described frontally predominant generalized spike and slow wave discharges that were at times pseudoperiodic with sleep.  In comparison with the previous record that showed a normal waking state, this is abnormal on the basis of the interictal activity.  The slowing in the background may reflect the sleep  deprivation to the patient.      ASSESSMENT AND PLAN  26 y.o. year old male here with history of seizures since age 44 years old, likely primary generalized epilepsy. Patient has had intermittent grand mal seizures, last occurring in 2016. Now with ongoing intermittent daily spells, decreased attention, decreased processing speed. These were raised as possibly nonepileptic spells.  Last grand mal seizure: Nov 2016  Daily staring spells.  Meds tried: lamictal, zarontin, keppra   Dx:  1. 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 daydreaming vs petit mal seizures)  Meds ordered this encounter  Medications  . lamoTRIgine (LAMICTAL) 200 MG tablet    Sig: Take 2 tabs by mouth every morning and 3.5 tabs by mouth every evening. (BRAND NAME MEDICALLY NECESSARY)    Dispense:  500 tablet    Refill:  4  . ethosuximide (ZARONTIN) 250 MG capsule    Sig: Take 1 capsule (250 mg total) by mouth 2 (two) times daily. BRAND MEDICALLY NECESSARY    Dispense:  180 capsule    Refill:  4   Return in about 4 months (around 11/07/2017).    Penni Bombard, MD 5/36/1443, 15:40 AM Certified in Neurology, Neurophysiology and Neuroimaging  [facility] [address], [place], [place] [address] [phone] \

## 2017-08-14 ENCOUNTER — Telehealth: Payer: Self-pay | Admitting: *Deleted

## 2017-08-14 NOTE — Telephone Encounter (Signed)
LVM requesting call back to clarify if he needs brand name for Zarontin. Advised this RN received a fax from [facility] needing clarification on Zarontin brand name only medically necessary or can he take generic.

## 2017-08-15 NOTE — Telephone Encounter (Signed)
Spoke with patient and advised this RN received a fax from [facility] for clarification of Zarontin. Inquired if he needs to take brand name only of Zarontin.  Patient stated that his bottle "has always said ethosuximide" prescribed by Dr Karma Lew.  He stated he must take brand name only Lamictal, but he can take esthosuximide. This RN advised will have Dr Leta Baptist sign [facility] form and fax back. He verbalized understanding of call.

## 2017-09-25 ENCOUNTER — Encounter: Payer: Self-pay | Admitting: Family

## 2017-09-25 ENCOUNTER — Ambulatory Visit: Payer: 59 | Admitting: Family

## 2017-09-25 VITALS — BP 118/80 | HR 68 | Temp 98.0°F | Ht 71.0 in | Wt 219.0 lb

## 2017-09-25 DIAGNOSIS — J069 Acute upper respiratory infection, unspecified: Secondary | ICD-10-CM | POA: Diagnosis not present

## 2017-09-25 MED ORDER — FLUTICASONE PROPIONATE 50 MCG/ACT NA SUSP: 2.0000 | Freq: Every day | NASAL | 6 refills | Status: DC

## 2017-09-25 NOTE — Progress Notes (Signed)
Christopher Reed is a 26 y.o. male with the following history as recorded in EpicCare:  Patient Active Problem List   Diagnosis Date Noted  . Protein in urine 06/15/2016  . Angioedema 06/10/2014  . Generalized convulsive epilepsy without mention of intractable epilepsy 03/16/2013  . Generalized nonconvulsive epilepsy without mention of intractable epilepsy 03/16/2013  . Transient alteration of awareness 03/16/2013  . Essential and other specified forms of tremor 03/16/2013  . Encounter for long-term (current) use of other medications 03/16/2013  . Head injury, unspecified 03/16/2013  . Other malaise and fatigue 03/16/2013  . Anxiety   . Preventative health care 10/27/2011    Current Outpatient Medications  Medication Sig Dispense Refill  . EPINEPHrine (EPIPEN) 0.3 mg/0.3 mL IJ SOAJ injection Inject 0.3 mLs (0.3 mg total) into the muscle once. 2 Device 1  . ethosuximide (ZARONTIN) 250 MG capsule Take 1 capsule (250 mg total) by mouth 2 (two) times daily. BRAND MEDICALLY NECESSARY 180 capsule 4  . lamoTRIgine (LAMICTAL) 200 MG tablet Take 2 tabs by mouth every morning and 3.5 tabs by mouth every evening. (BRAND NAME MEDICALLY NECESSARY) 500 tablet 4  . fluticasone (FLONASE) 50 MCG/ACT nasal spray Place 2 sprays into both nostrils daily. 16 g 6   No current facility-administered medications for this visit.     Allergies: Bee venom  Past Medical History:  Diagnosis Date  . Anxiety   . Seizures ([place])    epilepsy since age 73yrs  . SINUSITIS- ACUTE-NOS 09/25/2010  . TESTICULAR MASS 11/09/2009  . UTI 11/09/2009    Past Surgical History:  Procedure Laterality Date  . ANKLE ARTHROSCOPY Left   . DG THUMB RIGHT HAND ([place] HX)     2-3 yrs ago 2015 or 2016  . Knee surgury  03/2006   Right knee - meniscus tear/minor ACL tear  . LIGAMENT REPAIR Right 05/07/2013   Procedure: cheilectomy, microfracture of metacarpal head surface right thumb;  Surgeon: Cammie Sickle., MD;  Location: [facility];  Service: Orthopedics;  Laterality: Right;  . MYRINGOTOMY WITH TUBE PLACEMENT     as child  . TOE SURGERY Right     Family History  Problem Relation Age of Onset  . Diabetes Other        1st degree relative  . Arthritis Other   . Hypertension Other   . Other Maternal Grandfather        Spinal Meningitis-Died at 39  . Hepatitis C Paternal Grandmother        Died at 23  . Pneumonia Paternal Grandfather        Died at 78  . Hypertension Mother   . Hypertension Father   . Diabetes Father        type 2    Social History   Tobacco Use  . Smoking status: Never Smoker  . Smokeless tobacco: Never Used  Substance Use Topics  . Alcohol use: No    Subjective:  Patient presents with cold/ congestion x 2 days; concerned that symptoms could turn into a sinus infection; not currently taking any OTC medications; worried about nasal drainage; + sore throat; no chest pain, no shortness of breath; not prone to recurrent sinus infections;    Objective:  Vitals:   09/25/17 0823  BP: 118/80  Pulse: 68  Temp: 98 F (36.7 C)  SpO2: 97%  Weight: 219 lb (99.3 kg)  Height: 5\' 11"  (1.803 m)    General: Well developed, well nourished, in no  acute distress  Skin : Warm and dry.  Head: Normocephalic and atraumatic  Eyes: Sclera and conjunctiva clear; pupils round and reactive to light; extraocular movements intact  Ears: External normal; canals clear; tympanic membranes normal  Oropharynx: Pink, supple. No suspicious lesions  Neck: Supple without thyromegaly, adenopathy  Lungs: Respirations unlabored; clear to auscultation bilaterally without wheeze, rales, rhonchi  CVS exam: normal rate and regular rhythm.  Neurologic: Alert and oriented; speech intact; face symmetrical; moves all extremities well; CNII-XII intact without focal deficit  Assessment:  1. Viral URI     Plan:  Reassurance given; suspect viral; Rx for Flonase; increase fluids, rest; follow-up worse, no  better.   Return if symptoms worsen or fail to improve.  No orders of the defined types were placed in this encounter.   Requested Prescriptions   Signed Prescriptions Disp Refills  . fluticasone (FLONASE) 50 MCG/ACT nasal spray 16 g 6    Sig: Place 2 sprays into both nostrils daily.

## 2017-11-11 ENCOUNTER — Ambulatory Visit: Payer: 59 | Admitting: Diagnostic Neuroimaging

## 2017-12-09 ENCOUNTER — Ambulatory Visit: Payer: 59 | Admitting: Diagnostic Neuroimaging

## 2017-12-19 ENCOUNTER — Telehealth: Payer: Self-pay | Admitting: *Deleted

## 2017-12-19 NOTE — Telephone Encounter (Signed)
Started PA for brand only Lamictal on CMM.

## 2017-12-19 NOTE — Telephone Encounter (Signed)
Brand only Lamictal approved until 12/19/2018

## 2018-01-14 DIAGNOSIS — N186 End stage renal disease: Secondary | ICD-10-CM | POA: Diagnosis not present

## 2018-01-14 DIAGNOSIS — I871 Compression of vein: Secondary | ICD-10-CM | POA: Diagnosis not present

## 2018-01-14 DIAGNOSIS — Z992 Dependence on renal dialysis: Secondary | ICD-10-CM | POA: Diagnosis not present

## 2018-02-05 ENCOUNTER — Telehealth: Payer: Self-pay | Admitting: Diagnostic Neuroimaging

## 2018-02-05 MED ORDER — ETHOSUXIMIDE 250 MG PO CAPS: 250.0000 mg | ORAL_CAPSULE | Freq: Two times a day (BID) | ORAL | 2 refills | Status: DC

## 2018-02-05 NOTE — Telephone Encounter (Signed)
Christopher Reed with [employer] has questions about ethosuximide (ZARONTIN) 250 MG capsule. Is generic alright or needs brand name?  TKW#[id number].

## 2018-02-05 NOTE — Telephone Encounter (Signed)
Spoke to Boyd, Software engineer with [employer], that per 07/2017 faxed back [facility] to use generic Zonegran. I [facility] new prescription [facility] re: this.  Ethosuximide 250mg  po BID #180 with 2 refills.

## 2018-02-05 NOTE — Addendum Note (Signed)
Addended by: Oliver Hum S on: 02/05/2018 11:44 AM   Modules accepted: Orders

## 2018-02-24 ENCOUNTER — Ambulatory Visit (INDEPENDENT_AMBULATORY_CARE_PROVIDER_SITE_OTHER): Payer: 59 | Admitting: Sports Medicine

## 2018-02-24 ENCOUNTER — Encounter: Payer: Self-pay | Admitting: Sports Medicine

## 2018-02-24 ENCOUNTER — Ambulatory Visit
Admission: RE | Admit: 2018-02-24 | Discharge: 2018-02-24 | Disposition: A | Discharge status: Home / Self Care | Payer: 59 | Source: Ambulatory Visit | Attending: Sports Medicine | Admitting: Sports Medicine

## 2018-02-24 VITALS — BP 130/70 | Ht 71.0 in | Wt 207.0 lb

## 2018-02-24 DIAGNOSIS — M545 Low back pain, unspecified: Secondary | ICD-10-CM

## 2018-02-24 DIAGNOSIS — M4807 Spinal stenosis, lumbosacral region: Secondary | ICD-10-CM | POA: Diagnosis not present

## 2018-02-24 NOTE — Progress Notes (Signed)
   Subjective:    Patient ID: Christopher Reed, male    DOB: May 17, 1991, 27 y.o.   MRN: [id number]  HPI chief complaint: Low back pain  Pleasant 27 year old male comes in today complaining of one week of low back pain. He was simply standing up when he felt his back "go out". This has happened on several occasions previously but his pain resolved rather quickly while this episode persisted for several days. He describes discomfort across his low back. Occasional spasm. No radiating pain into his legs. No numbness or tingling. He's been told by chiropractor previously that he had a disc problem but he's not had any imaging recently. No prior low back surgeries. He's tried a back brace which has been helpful. Occasional over-the-counter anti-inflammatories. No change in bowel or bladder. No nighttime pain. No fevers or chills. No recent weight loss.  Past medical history reviewed Medications reviewed Allergies reviewed  Review of Systems As above    Objective:   Physical Exam  Well-developed, well-nourished. No acute distress. Awake alert and oriented 3. Vital signs reviewed. He's sitting comfortably in the exam room.  Lumbar spine: Full painless lumbar range of motion. No tenderness over the lumbar midline. No spasm. No tenderness to palpation at the SI joint.  Neurological exam: Reflexes are brisk and equal at the Achilles and patellar tendons. Strength is 5/5 in both lower extremities. Sensation is intact to light touch grossly.  X-rays of the lumbar spine including AP and lateral views are reviewed. There is lumbaralization of the first sacral unit. Otherwise, unremarkable.      Assessment & Plan:   Low back pain secondary to lumbar strain  I recommended formal physical therapy at [facility]. Patient may continue with his lumbar support as needed.He does not have any radiculopathy so do not believe that we need to pursue further imaging. He can wean to a home exercise program per  the therapist's discretion. Follow-up with me for ongoing or recalcitrant issues.

## 2018-03-03 DIAGNOSIS — M545 Low back pain: Secondary | ICD-10-CM | POA: Diagnosis not present

## 2018-03-03 DIAGNOSIS — R293 Abnormal posture: Secondary | ICD-10-CM | POA: Diagnosis not present

## 2018-03-03 DIAGNOSIS — M2560 Stiffness of unspecified joint, not elsewhere classified: Secondary | ICD-10-CM | POA: Diagnosis not present

## 2018-03-05 DIAGNOSIS — R293 Abnormal posture: Secondary | ICD-10-CM | POA: Diagnosis not present

## 2018-03-05 DIAGNOSIS — M545 Low back pain: Secondary | ICD-10-CM | POA: Diagnosis not present

## 2018-03-05 DIAGNOSIS — M2560 Stiffness of unspecified joint, not elsewhere classified: Secondary | ICD-10-CM | POA: Diagnosis not present

## 2018-03-10 DIAGNOSIS — R293 Abnormal posture: Secondary | ICD-10-CM | POA: Diagnosis not present

## 2018-03-10 DIAGNOSIS — M2560 Stiffness of unspecified joint, not elsewhere classified: Secondary | ICD-10-CM | POA: Diagnosis not present

## 2018-03-10 DIAGNOSIS — M545 Low back pain: Secondary | ICD-10-CM | POA: Diagnosis not present

## 2018-03-19 DIAGNOSIS — M545 Low back pain: Secondary | ICD-10-CM | POA: Diagnosis not present

## 2018-03-19 DIAGNOSIS — M2560 Stiffness of unspecified joint, not elsewhere classified: Secondary | ICD-10-CM | POA: Diagnosis not present

## 2018-03-19 DIAGNOSIS — R293 Abnormal posture: Secondary | ICD-10-CM | POA: Diagnosis not present

## 2018-03-24 DIAGNOSIS — M2560 Stiffness of unspecified joint, not elsewhere classified: Secondary | ICD-10-CM | POA: Diagnosis not present

## 2018-03-24 DIAGNOSIS — M545 Low back pain: Secondary | ICD-10-CM | POA: Diagnosis not present

## 2018-03-24 DIAGNOSIS — R293 Abnormal posture: Secondary | ICD-10-CM | POA: Diagnosis not present

## 2018-05-26 ENCOUNTER — Ambulatory Visit: Payer: 59 | Admitting: Diagnostic Neuroimaging

## 2018-05-26 ENCOUNTER — Encounter: Payer: Self-pay | Admitting: Diagnostic Neuroimaging

## 2018-05-26 ENCOUNTER — Encounter

## 2018-05-26 VITALS — BP 135/74 | HR 72 | Ht 71.0 in | Wt 210.0 lb

## 2018-05-26 DIAGNOSIS — G40309 Generalized idiopathic epilepsy and epileptic syndromes, not intractable, without status epilepticus: Secondary | ICD-10-CM | POA: Diagnosis not present

## 2018-05-26 DIAGNOSIS — R4689 Other symptoms and signs involving appearance and behavior: Secondary | ICD-10-CM

## 2018-05-26 MED ORDER — LAMICTAL 200 MG PO TABS: ORAL_TABLET | ORAL | 12 refills | Status: DC

## 2018-05-26 MED ORDER — ETHOSUXIMIDE 250 MG PO CAPS: 250.0000 mg | ORAL_CAPSULE | Freq: Two times a day (BID) | ORAL | 4 refills | Status: DC

## 2018-05-26 NOTE — Progress Notes (Signed)
[facility]  PATIENT: Christopher Reed DOB: [birth year]  REFERRING CLINICIAN: Karleen Hampshire, MD HISTORY FROM: patient and sister REASON FOR VISIT: follow up    HISTORICAL  CHIEF COMPLAINT:  Chief Complaint  Patient presents with  . Epilepsy    rm 6,  sister- Clara, "per sister, continues to have petite seizures as before, but no change in seizure frequency"  . Follow-up    HISTORY OF PRESENT ILLNESS:   UPDATE (05/26/18, VRP): Since last visit, doing about the same. Symptoms are stable. Severity is moderate. No alleviating or aggravating factors. Tolerating anti-seizure meds. Avg ~10 staring spells per day. Not clear if these are daydreaming or petit mal sz.   PRIOR HPI (07/08/17): 27 year old right-handed male here for evaluation of seizure disorder.  Patient had normal birth and development. At age 27 years old he had intermittent episodes of seizures. Patient would have staring spells as well as grand mal seizures. He would have intermittent muscle jerks. Over the years he was treated with Lamictal and this dose was increased over time. Last grand mal seizure was run 2014.  He was then developing continued problems with intermittent daily staring spells. Therefore he was referred to [facility] video EEG for evaluation in January 2015. During the admission his Lamictal was decreased from 600 mg twice a day down to 300 mg twice a day. Patient had several stereotypical events which were confirmed to be correlated with electrographic seizure activity. He was discharged on Lamictal 400 mg twice a day with ethosuxamide 250mg  twice a day. Patient was tried on Keppra but they could not tolerate side effects. Modified Atkins diet was considered. VNS was considered. In 2016 Dr. Ginny Forth raised possibility of trying Depakote to help with better seizure control, but patient and family declined due to potential side effects.  Patient also had home sleep study which ruled out  sleep apnea.  Patient had a ambulatory EEG in November 2017 which showed generalized spike and wave discharges. Patient had one staring spell without EEG correlate. Therefore it was concluded that patient staring spells may not be related to epileptic discharges. Patient last seen at [facility] in April 2018.  Now patient presenting here to establish with local neurologist.  Family is concerned about his ongoing daily staring spells, tremors, joint pain, difficulty in working and living independently.    REVIEW OF SYSTEMS: Full 14 system review of systems performed and negative with exception of: tremors.   ALLERGIES: Allergies  Allergen Reactions  . Bee Venom     angioedema    HOME MEDICATIONS: Outpatient Medications Prior to Visit  Medication Sig Dispense Refill  . EPINEPHrine (EPIPEN) 0.3 mg/0.3 mL IJ SOAJ injection Inject 0.3 mLs (0.3 mg total) into the muscle once. 2 Device 1  . ethosuximide (ZARONTIN) 250 MG capsule Take 1 capsule (250 mg total) by mouth 2 (two) times daily. 180 capsule 2  . lamoTRIgine (LAMICTAL) 200 MG tablet Take 2 tabs by mouth every morning and 3.5 tabs by mouth every evening. (BRAND NAME MEDICALLY NECESSARY) 500 tablet 4  . fluticasone (FLONASE) 50 MCG/ACT nasal spray Place 2 sprays into both nostrils daily. (Patient not taking: Reported on 05/26/2018) 16 g 6   No facility-administered medications prior to visit.     PAST MEDICAL HISTORY: Past Medical History:  Diagnosis Date  . Anxiety   . Seizures ([place])    epilepsy since age 27yrs  . SINUSITIS- ACUTE-NOS 09/25/2010  . TESTICULAR MASS 11/09/2009  . UTI 11/09/2009  PAST SURGICAL HISTORY: Past Surgical History:  Procedure Laterality Date  . ANKLE ARTHROSCOPY Left   . DG THUMB RIGHT HAND ([place] HX)     2-3 yrs ago 2015 or 2016  . Knee surgury  03/2006   Right knee - meniscus tear/minor ACL tear  . LIGAMENT REPAIR Right 05/07/2013   Procedure: cheilectomy, microfracture of metacarpal  head surface right thumb;  Surgeon: Cammie Sickle., MD;  Location: [facility];  Service: Orthopedics;  Laterality: Right;  . MYRINGOTOMY WITH TUBE PLACEMENT     as child  . TOE SURGERY Right     FAMILY HISTORY: Family History  Problem Relation Age of Onset  . Diabetes Other        1st degree relative  . Arthritis Other   . Hypertension Other   . Other Maternal Grandfather        Spinal Meningitis-Died at 31  . Hepatitis C Paternal Grandmother        Died at 78  . Pneumonia Paternal Grandfather        Died at 50  . Hypertension Mother   . Hypertension Father   . Diabetes Father        type 2    SOCIAL HISTORY:  Social History   Socioeconomic History  . Marital status: Single    Spouse name: Not on file  . Number of children: Not on file  . Years of education: Not on file  . Highest education level: Not on file  Occupational History  . Not on file  Social Needs  . Financial resource strain: Not on file  . Food insecurity:    Worry: Not on file    Inability: Not on file  . Transportation needs:    Medical: Not on file    Non-medical: Not on file  Tobacco Use  . Smoking status: Never Smoker  . Smokeless tobacco: Never Used  Substance and Sexual Activity  . Alcohol use: No  . Drug use: No  . Sexual activity: Never  Lifestyle  . Physical activity:    Days per week: Not on file    Minutes per session: Not on file  . Stress: Not on file  Relationships  . Social connections:    Talks on phone: Not on file    Gets together: Not on file    Attends religious service: Not on file    Active member of club or organization: Not on file    Attends meetings of clubs or organizations: Not on file    Relationship status: Not on file  . Intimate partner violence:    Fear of current or ex partner: Not on file    Emotionally abused: Not on file    Physically abused: Not on file    Forced sexual activity: Not on file  Other Topics Concern  . Not on  file  Social History Narrative   Lives at home with parents.  Works at [employer].  HS Education.  Pt is single and no children.  Caffeine 2 cups avg when drinks.       PHYSICAL EXAM  GENERAL EXAM/CONSTITUTIONAL: Vitals:  Vitals:   05/26/18 1606  BP: 135/74  Pulse: 72  Weight: 210 lb (95.3 kg)  Height: 5\' 11"  (1.803 m)   Body mass index is 29.29 kg/m. No exam data present  Patient is in no distress; well developed, nourished and groomed; neck is supple  CARDIOVASCULAR:  Examination of carotid arteries is normal;  no carotid bruits  Regular rate and rhythm, no murmurs  Examination of peripheral vascular system by observation and palpation is normal  EYES:  Ophthalmoscopic exam of optic discs and posterior segments is normal; no papilledema or hemorrhages  MUSCULOSKELETAL:  Gait, strength, tone, movements noted in Neurologic exam below  NEUROLOGIC: MENTAL STATUS:  No flowsheet data found.  awake, alert, oriented to person, place and time  recent and remote memory intact  normal attention and concentration  language fluent, comprehension intact, naming intact,   fund of knowledge appropriate  CRANIAL NERVE:   2nd - no papilledema on fundoscopic exam  2nd, 3rd, 4th, 6th - pupils equal and reactive to light, visual fields full to confrontation, extraocular muscles intact, no nystagmus  5th - facial sensation symmetric  7th - facial strength symmetric  8th - hearing intact  9th - palate elevates symmetrically, uvula midline  11th - shoulder shrug symmetric  12th - tongue protrusion midline  MOTOR:   normal bulk and tone, full strength in the BUE, BLE  MINIMAL POSTURAL AND ACTION TREMOR IN BUE  SENSORY:   normal and symmetric to light touch, temperature, vibration  COORDINATION:   finger-nose-finger, fine finger movements normal  REFLEXES:   deep tendon reflexes present and symmetric  GAIT/STATION:   narrow based gait; able to  walk tandem; romberg is negative    DIAGNOSTIC DATA (LABS, IMAGING, TESTING) - I reviewed patient records, labs, notes, testing and imaging myself where available.  Lab Results  Component Value Date   WBC 4.2 06/28/2017   HGB 15.5 06/28/2017   HCT 45.5 06/28/2017   MCV 97.0 06/28/2017   PLT 226.0 06/28/2017      Component Value Date/Time   NA 139 06/28/2017 0818   K 4.2 06/28/2017 0818   CL 102 06/28/2017 0818   CO2 30 06/28/2017 0818   GLUCOSE 91 06/28/2017 0818   BUN 14 06/28/2017 0818   CREATININE 1.07 06/28/2017 0818   CALCIUM 9.6 06/28/2017 0818   PROT 6.9 06/28/2017 0818   ALBUMIN 4.7 06/28/2017 0818   AST 17 06/28/2017 0818   ALT 34 06/28/2017 0818   ALKPHOS 54 06/28/2017 0818   BILITOT 0.8 06/28/2017 0818   GFRNONAA 92.72 09/21/2010 0849   GFRAA 127 10/06/2008 0821   Lab Results  Component Value Date   CHOL 146 06/28/2017   HDL 38.10 (L) 06/28/2017   LDLCALC 93 06/28/2017   TRIG 75.0 06/28/2017   CHOLHDL 4 06/28/2017   No results found for: HGBA1C No results found for: VITAMINB12 Lab Results  Component Value Date   TSH 1.08 06/28/2017      06/07/11 EEG - Abnormal EEG on the basis of described frontally predominant generalized spike and slow wave discharges that were at times pseudoperiodic with sleep.  In comparison with the previous record that showed a normal waking state, this is abnormal on the basis of the interictal activity.  The slowing in the background may reflect the sleep deprivation to the patient.      ASSESSMENT AND PLAN  27 y.o. year old male here with history of seizures since age 55 years old, likely primary generalized epilepsy. Patient has had intermittent grand mal seizures, last occurring in 2016. Now with ongoing intermittent daily spells, decreased attention, decreased processing speed. These were raised as possibly nonepileptic spells.  Last grand mal seizure: Nov 2016  Daily staring spells.  Meds tried: lamictal,  zarontin, keppra   Dx:  1. Nonintractable generalized idiopathic epilepsy without status epilepticus (  [place])     PLAN:  I spent 25 minutes of face to face time with patient. Greater than 50% of time was spent in counseling and coordination of care with patient. This is necessary because patient's symptoms are not optimally controlled / improved. In summary we discussed: - Diagnostic results, impressions, or recommended diagnostic studies: prior [facility] notes reviewed with patient and sister - Prognosis: fair - good - Risks and benefits of management (treatment) options: medication mgmt; medication side effects - Patient and family education: seizure and seizure meds   SEIZURE DISORDER (established, stable) - continue lamictal 400mg  (2 x 200) in AM and 700mg  (3.5 x 200) in PM (brand medically necessary) - continue zarontin 250mg  twice a day (brand medically necessary) - in future may consider transition to depakote, which is very effective for primary generalized epilepsy - setup ambulatory EEG ([facility]; Dr Ginny Forth)  Meds ordered this encounter  Medications  . LAMICTAL 200 MG tablet    Sig: Take 2 tablets (400 mg total) by mouth daily AND 3.5 tablets (700 mg total) every evening.    Dispense:  180 tablet    Refill:  12    BRAND MEDICALLY NECESSARY  . ethosuximide (ZARONTIN) 250 MG capsule    Sig: Take 1 capsule (250 mg total) by mouth 2 (two) times daily.    Dispense:  180 capsule    Refill:  4   Orders Placed This Encounter  Procedures  . Ambulatory referral to Neurology   Return in about 9 months (around 02/24/2019).    Penni Bombard, MD 11/29/2058, 1:56 PM Certified in Neurology, Neurophysiology and Neuroimaging  [facility] [address] [place], [place] [address] [phone] \

## 2018-05-27 ENCOUNTER — Telehealth: Payer: Self-pay | Admitting: Diagnostic Neuroimaging

## 2018-05-27 NOTE — Telephone Encounter (Signed)
Sent to Dr. Jenetta DownerLu Duffel  Telephone [phone] - fax [phone] [facility] was going to set an apt for December I requested something sooner if they could.  Nurse Caren Griffins will review records and call me back with details . Telephone [phone]- fax [phone].

## 2018-07-08 ENCOUNTER — Encounter: Payer: 59 | Admitting: Internal Medicine

## 2018-08-07 ENCOUNTER — Ambulatory Visit (INDEPENDENT_AMBULATORY_CARE_PROVIDER_SITE_OTHER): Payer: 59

## 2018-08-07 DIAGNOSIS — Z23 Encounter for immunization: Secondary | ICD-10-CM

## 2018-08-12 ENCOUNTER — Other Ambulatory Visit: Payer: Self-pay | Admitting: Diagnostic Neuroimaging

## 2018-08-14 ENCOUNTER — Telehealth: Payer: Self-pay | Admitting: Diagnostic Neuroimaging

## 2018-08-14 NOTE — Telephone Encounter (Addendum)
Spoke with patient's mother and advised her of Dr Gladstone Lighter specific instructions in his referral to Dr Jeryl Columbia: Refer to Dr. Ginny Forth [facility]) to setup ambulatory EEG; eval staring spells (10 per day); eval for petit mal vs daydreaming spells. She stated she got a call today to reschedule his appointment at [facility] due to Dr Ginny Forth being out of the office. She was told that her son would not be receiving any testing that day. Patient's mother stated their understanding was Dr Leta Baptist wanted testing done. This RN did advise her that he may need to see Dr Ginny Forth first because he was last seen > 1 year ago. Each health system has their own protocol for doing things. This RN advised will have Jayme Cloud call Dr Jamelle Rushing office to clarify that they are aware of Dr [facility] complete referral notes and requests. Advised mother, will have Hinton Dyer touch base with her after talking to [facility]. She verbalized understanding, appreciation.

## 2018-08-14 NOTE — Telephone Encounter (Signed)
Pt mother(on [place]) has called to say she understood that Dr Leta Baptist was sending pt to [facility] for testing, when pt went to see Dr in [place] they were told they would just be seeing him and were not aware of testing they were supposed to run on pt.  Mother is asking for a calll to clarify reason as to why pt was referred to go to Dr in [place]

## 2018-08-15 NOTE — Telephone Encounter (Signed)
I have called and left mother a message.

## 2018-08-15 NOTE — Telephone Encounter (Signed)
I have called [facility] and what is happening patient was scheduled for March to come in and now he is binging pushed back more because Dr. Jeryl Columbia is not going to be in the office.  I have requested and again a sooner apt and his ambulatory EEG done to be done sooner. I requested a call back please with in 48 hours . Left message with Nurse Review Team .

## 2018-08-28 ENCOUNTER — Other Ambulatory Visit: Payer: Self-pay | Admitting: Diagnostic Neuroimaging

## 2018-09-09 IMAGING — DX DG LUMBAR SPINE 2-3V
3 series · 3 of 3 positions shown · non-contrast
Comparison: None.

CLINICAL DATA: 26-year-old male with low back pain for the past
week. Does not recall injury. Picking up object and turning. Initial
encounter.

EXAM:
LUMBAR SPINE - 2-3 VIEW

[dg lumbar spine 2-3 views (1 of 3)]
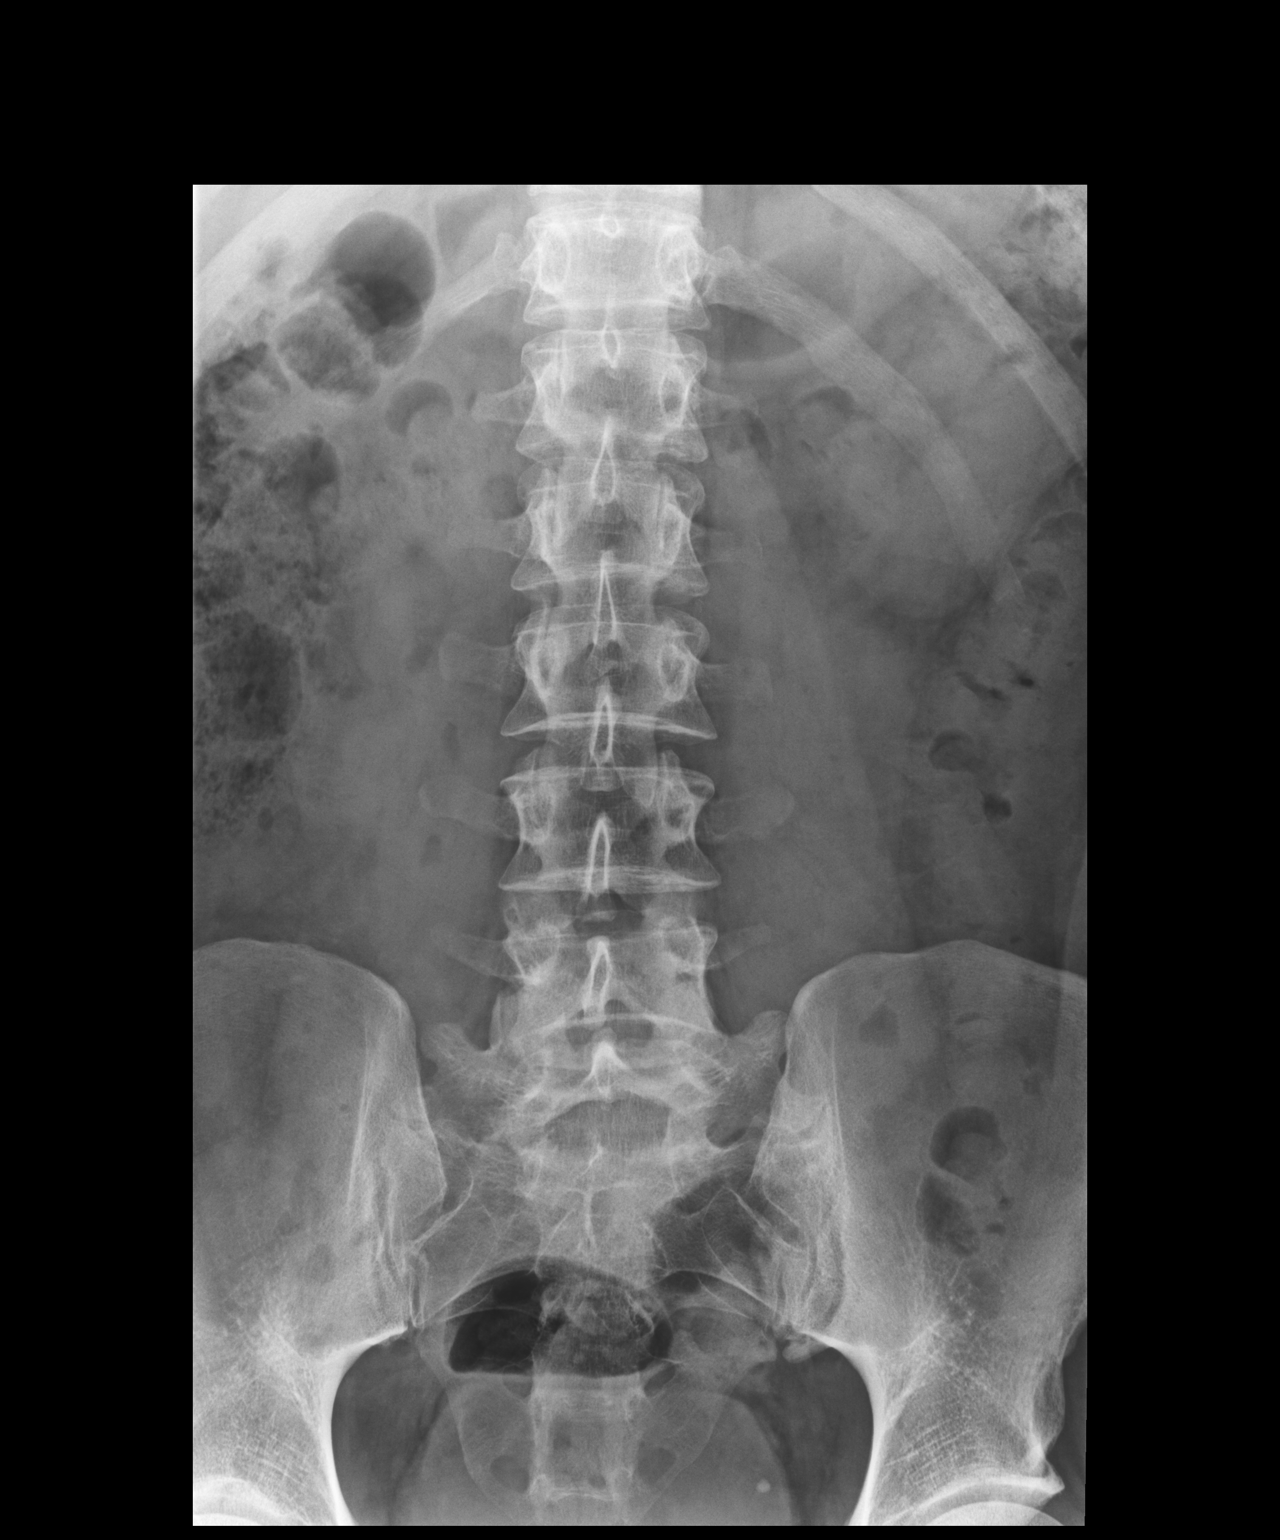

[dg lumbar spine 2-3 views (2 of 3)]
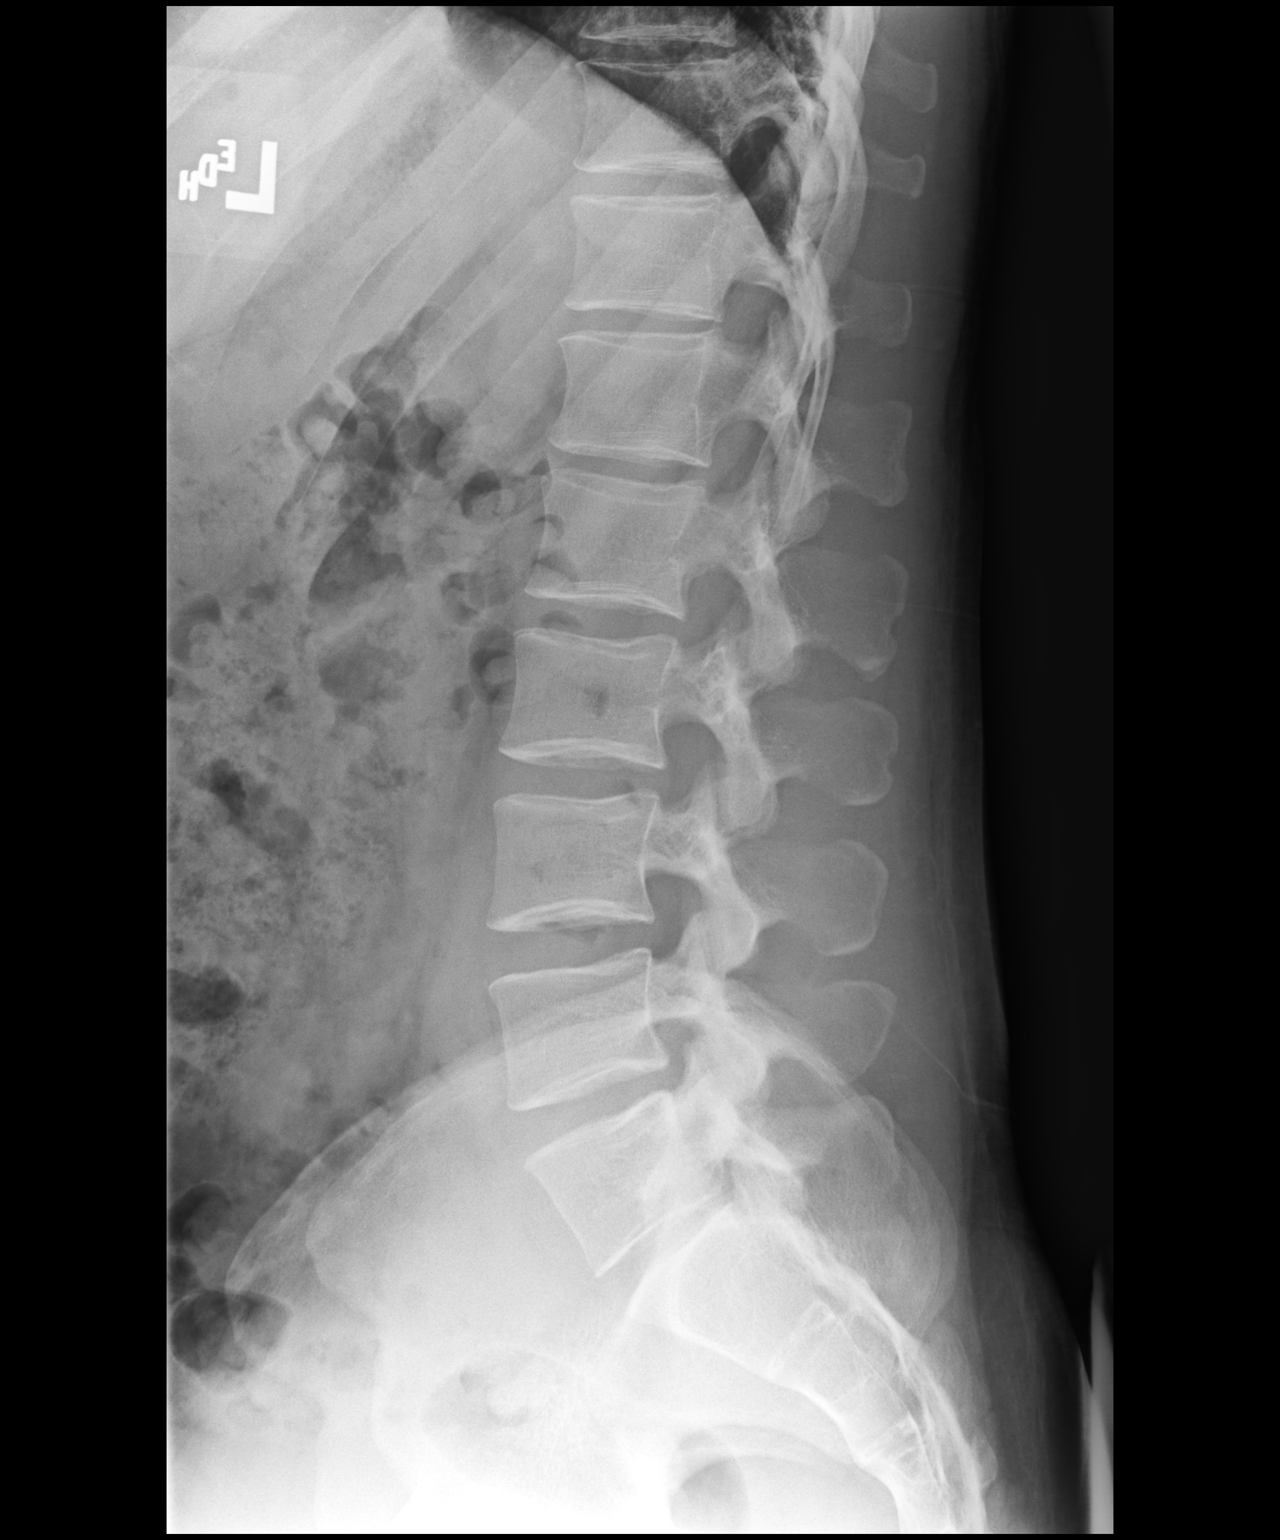

[dg lumbar spine 2-3 views (3 of 3)]
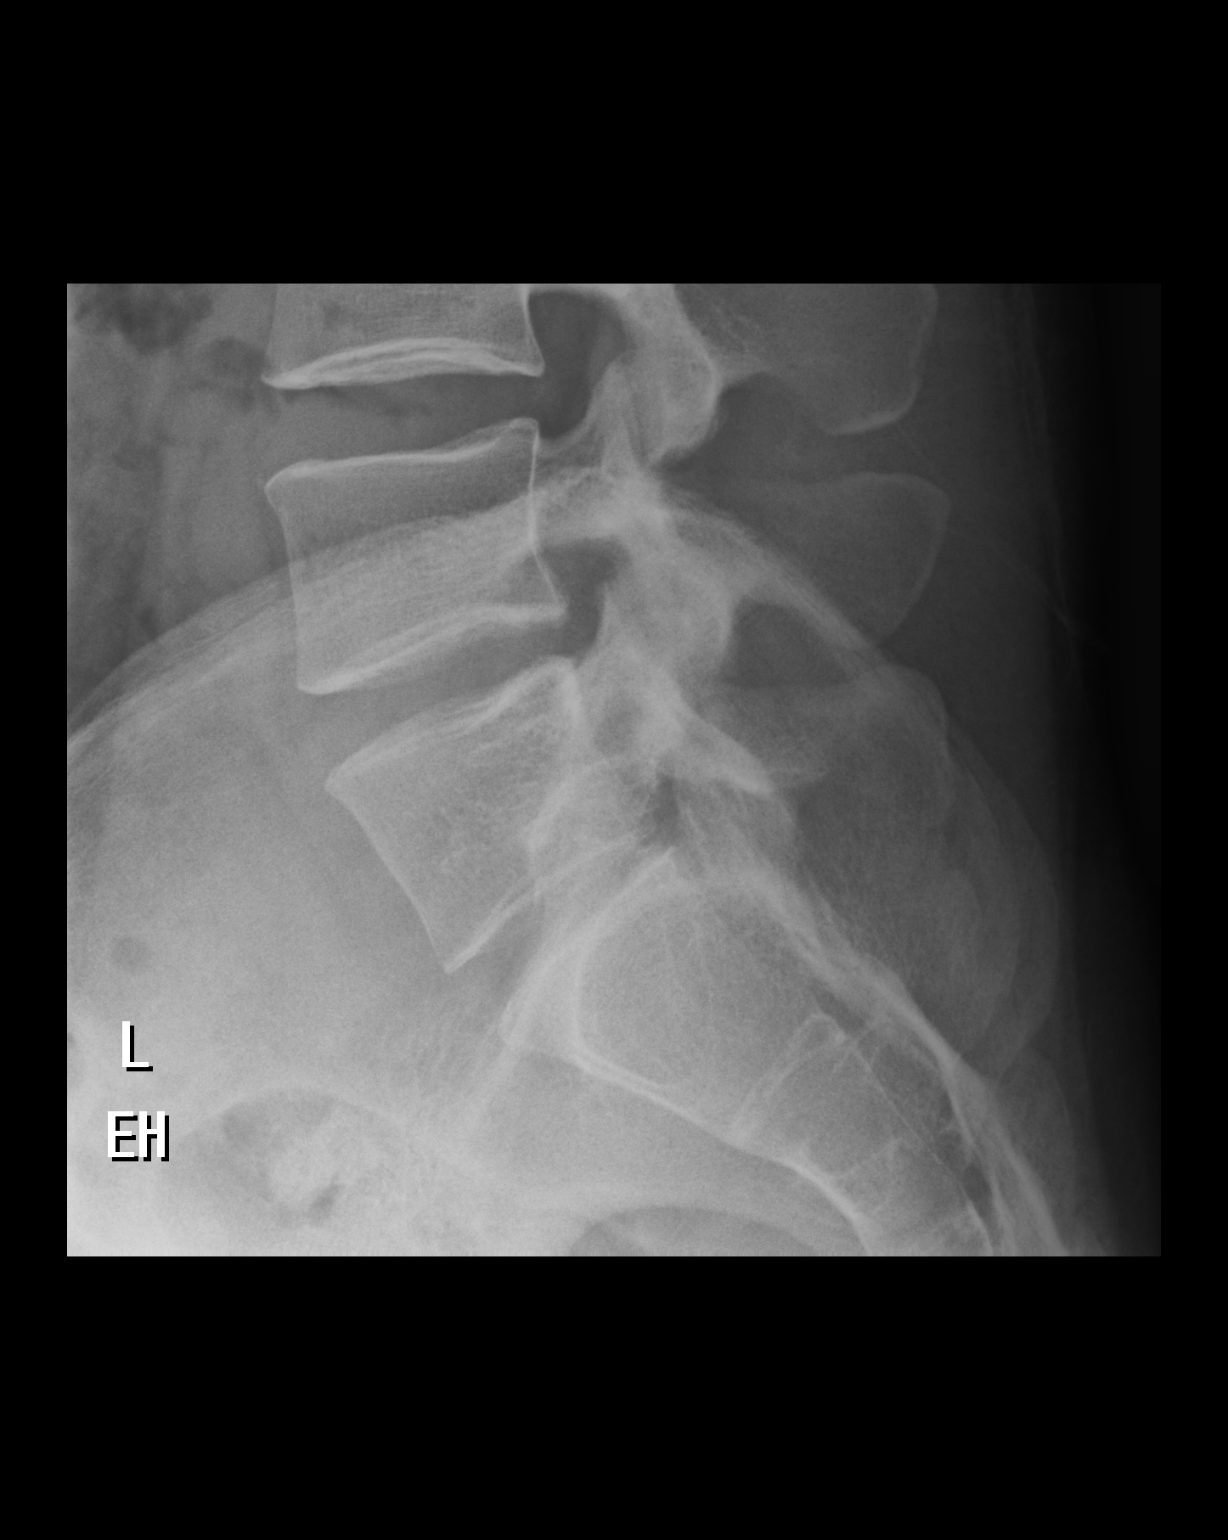

[3 of 3 positions shown; findings below may reference images not displayed]

FINDINGS: Normal alignment without fracture.

Minimal narrowing posterior aspect L5-S1 disc space.

Minimal Schmorl's node deformity L1-2, L2-3, L3-4 and L4-5.
IMPRESSION: Minimal narrowing posterior aspect L5-S1 disc space.

## 2018-09-11 ENCOUNTER — Encounter: Payer: 59 | Admitting: Internal Medicine

## 2018-09-25 DIAGNOSIS — B353 Tinea pedis: Secondary | ICD-10-CM | POA: Diagnosis not present

## 2018-10-09 DIAGNOSIS — B353 Tinea pedis: Secondary | ICD-10-CM | POA: Diagnosis not present

## 2018-10-10 DIAGNOSIS — G40309 Generalized idiopathic epilepsy and epileptic syndromes, not intractable, without status epilepticus: Secondary | ICD-10-CM | POA: Diagnosis not present

## 2018-10-10 DIAGNOSIS — R569 Unspecified convulsions: Secondary | ICD-10-CM | POA: Diagnosis not present

## 2018-10-11 DIAGNOSIS — G40309 Generalized idiopathic epilepsy and epileptic syndromes, not intractable, without status epilepticus: Secondary | ICD-10-CM | POA: Diagnosis not present

## 2018-12-02 ENCOUNTER — Other Ambulatory Visit: Payer: Self-pay | Admitting: Diagnostic Neuroimaging

## 2018-12-16 ENCOUNTER — Telehealth: Payer: Self-pay | Admitting: Diagnostic Neuroimaging

## 2018-12-16 NOTE — Telephone Encounter (Signed)
Pts father/DPR would like to know that if pt is dropped from his medical insurance and they only use his Medicare would his LAMICTAL 200 MG tablet be able to be preauthorized or will he be needing to switch medications. Please advise.

## 2018-12-16 NOTE — Telephone Encounter (Signed)
Attempted to reach father. No answer.

## 2018-12-18 NOTE — Telephone Encounter (Signed)
Spoke to parents of pt.  Pt to sign up for medicare.  They are asking what part D policy may approve [place], [employer], or [employer].  They have asked and this is not covered by any at this point.  Per mom  lamotrigine had been tried and failed due to breakthru seizures.  He is taking lamical BN and zonegran BN. I relayed that having tried lamotrigine, that is good that may approve BN but could not say 100%.  I would ask other RN and Dr. Leta Baptist and see what they think.

## 2018-12-19 NOTE — Telephone Encounter (Signed)
LMVM for father. Relayed that spoke to othe RN and she recommended to let insurance co know what mediations he has taken in the past. He is stable now on BN Lamictal.  Have Chief Executive Officer to research all insurance and then let you know.  If ? Call back.

## 2019-01-22 ENCOUNTER — Telehealth: Payer: Self-pay | Admitting: Diagnostic Neuroimaging

## 2019-01-22 NOTE — Telephone Encounter (Signed)
LVM requesting mother call back. Advised her I will leave today at 4:30 pm, Newman Pies will be here longer. We are closed tomorrow.

## 2019-01-22 NOTE — Telephone Encounter (Signed)
Called patient's mother back, and father got on phone as well, both on [place]. Patient will be on [place] later this year. Mom asked if after his new insurance starts, can Dr  Leta Baptist write a letter so he can get BN medication. I advised that typically when he takes new cards to pharmacy to get refill, pharmacy will run through. If insurance denies, a PA is sent to our office to complete in order to get insurance approval. I advised he may still have deductible or co pay. I advised he currently has refills on file with [facility]. They  verbalized understanding, appreciation.

## 2019-01-22 NOTE — Telephone Encounter (Signed)
Pt mother has called asking for a call to speak with RN re: pt's insurance for the lamictal.  This is a request from conversations on 12-18-2018 and 02-28

## 2019-01-22 NOTE — Telephone Encounter (Signed)
[facility] medicare/medicaid

## 2019-02-12 ENCOUNTER — Telehealth: Payer: Self-pay | Admitting: *Deleted

## 2019-02-12 NOTE — Telephone Encounter (Signed)
Called patient and advised him due to current COVID 19 pandemic, our office is severely reducing in person visits in order to minimize the risk to our patients and healthcare providers. We recommend to convert your appointment to a video visit. We'll take all precautions to reduce any security or privacy concerns. This will be treated like an office visit, and we will file with your insurance. He consented to video visit. Pt's email is [email or web address]. Pt understands that the cisco webex software must be downloaded and operational on the device pt plans to use for the visit. Updated EMR. He  verbalized understanding, appreciation. Webex scheduled; e mail sent.

## 2019-02-16 ENCOUNTER — Ambulatory Visit (INDEPENDENT_AMBULATORY_CARE_PROVIDER_SITE_OTHER): Payer: Medicare Other | Admitting: Diagnostic Neuroimaging

## 2019-02-16 ENCOUNTER — Other Ambulatory Visit: Payer: Self-pay

## 2019-02-16 ENCOUNTER — Encounter: Payer: Self-pay | Admitting: Diagnostic Neuroimaging

## 2019-02-16 DIAGNOSIS — G40309 Generalized idiopathic epilepsy and epileptic syndromes, not intractable, without status epilepticus: Secondary | ICD-10-CM | POA: Diagnosis not present

## 2019-02-16 MED ORDER — LAMICTAL 200 MG PO TABS: ORAL_TABLET | ORAL | 4 refills | Status: DC

## 2019-02-16 MED ORDER — ETHOSUXIMIDE 250 MG PO CAPS: ORAL_CAPSULE | ORAL | 4 refills | Status: DC

## 2019-02-16 NOTE — Progress Notes (Signed)
    Virtual Visit via Video Note  I connected with Christopher Reed on 02/16/19 at  1:00 PM EDT by a video enabled telemedicine application and verified that I am speaking with the correct person using two identifiers.   I discussed the limitations of evaluation and management by telemedicine and the availability of in person appointments. The patient expressed understanding and agreed to proceed.  History of Present Illness:  - continues with daily staring spells (3-4 days per weeks; 5-7 spells per day) - had amb EEG at [facility]; some discharges noted; no clinical events; no subclincal seizures - overall stable - not driving currently; does not plan to drive in near future    Observations/Objective:  - awake, alert - face symm; EOM intact - no dysarthria; no tremor   Assessment and Plan:  28 y.o. male here with history of seizures since age 36 years old, likely primary generalized epilepsy. Patient has had intermittent grand mal seizures, last occurring in 2016. Now with ongoing intermittent daily spells, decreased attention, decreased processing speed. These were raised as possibly nonepileptic spells.  Last grand mal seizure: Nov 2016  Daily staring spells.  Meds tried: lamictal, zarontin, keppra   Dx:  1. 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    I discussed the assessment and treatment plan with the patient. The patient was provided an opportunity to ask questions and all were answered. The patient agreed with the plan and demonstrated an  understanding of the instructions.   The patient was advised to call back or seek an in-person evaluation if the symptoms worsen or if the condition fails to improve as anticipated.  I provided 15 minutes of non-face-to-face time during this encounter.  Penni Bombard, MD 1/54/0086, 7:61 PM Certified in Neurology, Neurophysiology and Neuroimaging  [facility] [address], [place], [place] [address] [phone]

## 2019-02-24 NOTE — Telephone Encounter (Signed)
Pt called in and stated that he needs a PA for Lamictal

## 2019-02-24 NOTE — Telephone Encounter (Addendum)
Attempted lamictal PA on CMM, stated patient not found. Called [facility], spoke with Lanelle Bal and advised her I needed new insurance information to do PA on Lamictal. She stated he picked up 30 day supply on 02/19/19 for $3.90, and she didn't see that it requires PA.  Called patient and advised of above. He stated he has new insurance and received a letter from [facility] re: AP on lamictal. He stated he won't need refill for a while. I advised he notify [facility] several days before running out of med to refill. If PA need, [facility] will fax [facility] notice. I then asked for new insurance info.  New insurance U[employer], medicare Rx, member ID [id number], Rx bin [id number], Rx PCN [id number], Rx group [id number], group # [id number].  Medicare #[id number] Patient verbalized understanding, appreciation.

## 2019-03-02 ENCOUNTER — Ambulatory Visit: Payer: 59 | Admitting: Diagnostic Neuroimaging

## 2019-03-20 ENCOUNTER — Other Ambulatory Visit: Payer: Self-pay | Admitting: Diagnostic Neuroimaging

## 2019-03-23 NOTE — Telephone Encounter (Signed)
Refilled Zarontin to [facility] mail service as requested with 3 additional refills. Called [facility] and LVM advising them of same. Advised they cancel all remaining refills, left office number.

## 2019-04-20 ENCOUNTER — Telehealth: Payer: Self-pay | Admitting: *Deleted

## 2019-04-20 ENCOUNTER — Telehealth: Payer: Self-pay | Admitting: Diagnostic Neuroimaging

## 2019-04-20 MED ORDER — LAMICTAL 200 MG PO TABS: ORAL_TABLET | ORAL | 4 refills | Status: DC

## 2019-04-20 NOTE — Telephone Encounter (Signed)
Called patient and informed him that Dr Leta Baptist sent in Rx for lamictal on 02/16/19 for 90 day supply with 4 more refills. I advised he call [facility] and discuss. He is to call back if any problems. Patient verbalized understanding, appreciation.

## 2019-04-20 NOTE — Telephone Encounter (Addendum)
Called patient who stated he talked to [facility] who stated they have him on 41 day Rx for lamictal. He said the person stated they cannot do 90 day without RN calling them. The patient then stated he needs refill on Zarontin. I advised it was refilled on 03/23/19 to [facility]. The patient stated he has new insurance, no longer sues [facility], and he requested [facility] be [facility]. I advised I'll call [facility], then call him back. Patient verbalized understanding, appreciation. [facility], [place], spoke with Sapana who stated that [facility] lamictal requires a new PA. She stated they have active Rx on file for ethosuximide until August.  Started PA on [facility] for brand name Lamictal, Key: [id number]. Dx: I09.735, Failed: Keppra (side effects), lamotrigine. PA sent to [facility]. Received immediate approval: LAMICTAL TAB 200MG , use as directed, is approved for non-formulary exception through 10/22/2019 under your Medicare Part D benefit.  Approval letter faxed to [place], F [phone]. Called patient and advised him of above. He verbalized understanding, appreciation.

## 2019-04-20 NOTE — Telephone Encounter (Signed)
Received [facility] from [facility] asking for clarification or new Rx for Lamictal (BN). Original order was to disp #600 for 90 day supply. Patient takes 2 tabs in morning, 3.5 tabs in evening. 90 day supply = 495 tabs. [facility] to [facility] tabs for 90 day supply.

## 2019-04-20 NOTE — Telephone Encounter (Signed)
Pt would like to know if he can switch from 30 day supply to 90 day supply on his LAMICTAL 200 MG tablet. Please advise.

## 2019-04-20 NOTE — Telephone Encounter (Signed)
Pt returning call please call back °

## 2019-05-27 ENCOUNTER — Telehealth: Payer: Self-pay | Admitting: Diagnostic Neuroimaging

## 2019-05-27 NOTE — Telephone Encounter (Addendum)
Called Christopher Reed w/[employer], advised her per note on 04/20/19 BN Lamictal was approved through 10/22/19 with medicare part D.  She stated she called because family called her stating he can't take generic. She stated her PA system was down, so she was unaware of approval in June. She stated she would call patient to advise.   I did start PA on CMM, Key [id number],  [id number], [facility] medicare.

## 2019-05-27 NOTE — Telephone Encounter (Signed)
Christopher Reed  [facility] Care Coordinator has called to inform that LAMICTAL 200 MG tablet generic does not work for pt and the brand name is not covered.  Christopher Reed is asking if RN can submit a PA for the LAMICTAL 200 MG tablet

## 2019-06-11 ENCOUNTER — Other Ambulatory Visit: Payer: Medicare Other

## 2019-06-11 ENCOUNTER — Ambulatory Visit (INDEPENDENT_AMBULATORY_CARE_PROVIDER_SITE_OTHER): Payer: Medicare Other | Admitting: Sports Medicine

## 2019-06-11 ENCOUNTER — Other Ambulatory Visit: Payer: Self-pay | Admitting: Sports Medicine

## 2019-06-11 ENCOUNTER — Other Ambulatory Visit: Payer: Self-pay

## 2019-06-11 VITALS — BP 132/86 | Ht 71.0 in | Wt 220.0 lb

## 2019-06-11 DIAGNOSIS — M25531 Pain in right wrist: Secondary | ICD-10-CM

## 2019-06-11 DIAGNOSIS — M25511 Pain in right shoulder: Secondary | ICD-10-CM

## 2019-06-11 MED ORDER — MELOXICAM 15 MG PO TABS: ORAL_TABLET | ORAL | 0 refills | Status: DC

## 2019-06-11 NOTE — Progress Notes (Addendum)
PCP: Biagio Borg, MD  Subjective:  CC: Right shoulder pain, right wrist pain  HPI: Patient is a 28 y.o. male here for right shoulder pain and right wrist pain.  Right shoulder pain: Patient states he has been having sharp 8/10 pain in the right shoulder for 1-2 weeks.  He states he is injured this shoulder in the past, but currently cannot identify any particular mechanism of injury that started his pain.  He states the pain is worse at night particularly when he rolls onto his shoulder.  He has increased pain while moving his arm overhead.  To alleviate his pain he has been using Voltaren gel.  The pain is localized to his right lateral deltoid and he denies any radiation of the pain.  Right wrist pain: Patient has been having a few weeks of right wrist pain that has recently been exacerbated from moving furniture around in his house while it is being renovated.  Otherwise, he cannot identify any mechanism of injury.  He localizes the pain to either side of his wrist and states it does not radiate.  The pain is alleviated with Voltaren gel, but gets worse with wrist extension, he grades it as 6/10.  Past Medical History:  Diagnosis Date  . Anxiety   . Seizures ([place])    epilepsy since age 69yrs  . SINUSITIS- ACUTE-NOS 09/25/2010  . TESTICULAR MASS 11/09/2009  . UTI 11/09/2009     Current Outpatient Medications on File Prior to Visit  Medication Sig Dispense Refill  . EPINEPHrine (EPIPEN) 0.3 mg/0.3 mL IJ SOAJ injection Inject 0.3 mLs (0.3 mg total) into the muscle once. (Patient not taking: Reported on 02/12/2019) 2 Device 1  . ethosuximide (ZARONTIN) 250 MG capsule TAKE 1 CAPSULE BY MOUTH TWO TIMES DAILY 180 capsule 3  . fluticasone (FLONASE) 50 MCG/ACT nasal spray Place 2 sprays into both nostrils daily. (Patient not taking: Reported on 05/26/2018) 16 g 6  . LAMICTAL 200 MG tablet TAKE 2 TABLETS BY MOUTH  EVERY MORNING AND 3.5 TABS BY MOUTH EVERY  EVENING; BRAND MEDICALLY NECESSARY 500  tablet 4   No current facility-administered medications on file prior to visit.     Past Surgical History:  Procedure Laterality Date  . ANKLE ARTHROSCOPY Left   . DG THUMB RIGHT HAND ([place] HX)     2-3 yrs ago 2015 or 2016  . Knee surgury  03/2006   Right knee - meniscus tear/minor ACL tear  . LIGAMENT REPAIR Right 05/07/2013   Procedure: cheilectomy, microfracture of metacarpal head surface right thumb;  Surgeon: Cammie Sickle., MD;  Location: [facility];  Service: Orthopedics;  Laterality: Right;  . MYRINGOTOMY WITH TUBE PLACEMENT     as child  . TOE SURGERY Right     Allergies  Allergen Reactions  . Bee Venom     angioedema    Social History   Socioeconomic History  . Marital status: Single    Spouse name: Not on file  . Number of children: Not on file  . Years of education: Not on file  . Highest education level: Not on file  Occupational History  . Not on file  Social Needs  . Financial resource strain: Not on file  . Food insecurity    Worry: Not on file    Inability: Not on file  . Transportation needs    Medical: Not on file    Non-medical: Not on file  Tobacco Use  . Smoking status:  Never Smoker  . Smokeless tobacco: Never Used  Substance and Sexual Activity  . Alcohol use: No  . Drug use: No  . Sexual activity: Never  Lifestyle  . Physical activity    Days per week: Not on file    Minutes per session: Not on file  . Stress: Not on file  Relationships  . Social Herbalist on phone: Not on file    Gets together: Not on file    Attends religious service: Not on file    Active member of club or organization: Not on file    Attends meetings of clubs or organizations: Not on file    Relationship status: Not on file  . Intimate partner violence    Fear of current or ex partner: Not on file    Emotionally abused: Not on file    Physically abused: Not on file    Forced sexual activity: Not on file  Other Topics Concern   . Not on file  Social History Narrative   Lives at home with parents.  Works at [employer].  HS Education.  Pt is single and no children.  Caffeine 2 cups avg when drinks.      Family History  Problem Relation Age of Onset  . Diabetes Other        1st degree relative  . Arthritis Other   . Hypertension Other   . Other Maternal Grandfather        Spinal Meningitis-Died at 55  . Hepatitis C Paternal Grandmother        Died at 24  . Pneumonia Paternal Grandfather        Died at 88  . Hypertension Mother   . Hypertension Father   . Diabetes Father        type 2    There were no vitals taken for this visit.  Review of Systems: See HPI above.     Objective:  Physical Exam:  Gen: NAD, comfortable in exam room Respiratory: Normal work of breathing, speaking in full sentences Left Shoulder: Inspection: no obvious deformity, atrophy, or asymmetry. No bruising. No swelling Palpation: no TTP over [facility] joint or bicipital groove. ROM: Full ROM in flexion, abduction, internal and external rotation Special Tests:  - Impingement: Neg Hawkins, neers, empty can sign. - Supraspinatous: Negative empty can.  5/5 strength with resisted flexion at 20 degrees - Infraspinatous/Teres Minor: 5/5 strength with ER - Subscapularis: 5/5 strength with IR - Biceps tendon: Yerrgason's  - AC Joint: Negative cross arm - No painful arc and no drop arm sign  Right Shoulder: Inspection reveals no obvious deformity, atrophy, or asymmetry. No bruising. No swelling Palpation is normal with no TTP over [facility] joint or bicipital groove. Full ROM in flexion, abduction, internal/external rotation Special Tests:  - Impingement: Neg Hawkins, neers, empty can sign. - Supraspinatous: Negative empty can.  5/5 strength with resisted flexion at 20 degrees - Infraspinatous/Teres Minor: 5/5 strength with ER - Subscapularis: 5/5 strength with IR - Biceps tendon: Yerrgason's  - AC Joint: Negative cross arm - No  painful arc and no drop arm sign  Left Wrist: Inspection: No obvious deformity. No swelling, erythema or bruising Palpation: no TTP ROM: Full ROM of the digits and wrist Strength: 5/5 strength in the forearm, wrist and interosseus muscles Neurovascular: NV intact Special tests: Negative finkelstein's, negative tinel's at the carpal tunnel, negative Phalen's and reverse Phalen's  Right Wrist: Inspection: No obvious deformity. No swelling, erythema  or bruising Palpation: minor TTP of dorsal ulnar aspect of R wrist ROM: Full ROM of the digits and wrist Strength: 5/5 strength in the forearm, wrist and interosseus muscles Neurovascular: NV intact Special tests: Negative finkelstein's, negative tinel's at the carpal tunnel, negative Phalen's and reverse Phalen's   Assessment & Plan:  1.  Right rotator cuff pain/inflammation secondary to impingement -Patient given home exercises for his rotator cuff -Meloxicam 15 mg for the next 7 days -No imaging at this time  2.  Right wrist synovitis: Most likely due to overuse -Meloxicam 15 mg for the next 7 days, then as needed -No imaging at this time -Patient given cock up wrist brace -Considering patient's multiple joint pain complaints we will also perform blood work, including CBC, CMP, sed rate, CRP, ANA, rheumatoid factor, and anti-CCP antibody.  We will follow-up with patient once results return  Milus Banister, [place], PGY-2 06/11/2019 2:03 PM  Patient seen and evaluated with the resident.  I agree with the above plan of care.  Patient is given rotator cuff exercises and a wrist splint.  Meloxicam for the next 7 days.  Given his complaint of multiple joint pain we will check some basic blood work (see above).  Phone follow-up with those results when available.  Addendum: Labs reviewed on June 16 2019.  All labs are normal.

## 2019-06-11 NOTE — Patient Instructions (Addendum)
Thank you for coming in to see [place] today! Please see below to review our plan for today's visit:  1.  You have been given home exercises for your rotator cuff.  Please perform these exercises in 3 sets of 10 at least once daily. 2.  Take meloxicam 15 mg to reduce pain and inflammation in your shoulder and wrist for the next 7 days.  You can then take this medicine daily as needed for painful flareups.  Do not take Aleve, naproxen, ibuprofen, or Motrin while taking meloxicam. 3.  Wear your wrist brace while performing exercises that are physically demanding on your wrist.  This should decrease any pain or inflammation in the wrist. 4.  Please have your blood work completed, we will follow-up with you regarding the results of your lab work once they have been completed (most likely by Monday 8/24).  Please call the clinic if your symptoms worsen or you have any concerns. It was our pleasure to serve you!    Dr. O[facility]

## 2019-06-12 LAB — SPECIMEN STATUS REPORT

## 2019-06-13 LAB — COMPREHENSIVE METABOLIC PANEL
ALT: 27 IU/L (ref 0–44)
AST: 18 IU/L (ref 0–40)
Albumin/Globulin Ratio: 1.9 (ref 1.2–2.2)
Albumin: 4.7 g/dL (ref 4.1–5.2)
Alkaline Phosphatase: 86 IU/L (ref 39–117)
BUN/Creatinine Ratio: 14 (ref 9–20)
BUN: 15 mg/dL (ref 6–20)
Bilirubin Total: 0.4 mg/dL (ref 0.0–1.2)
CO2: 23 mmol/L (ref 20–29)
Calcium: 9.5 mg/dL (ref 8.7–10.2)
Chloride: 101 mmol/L (ref 96–106)
Creatinine, Ser: 1.09 mg/dL (ref 0.76–1.27)
GFR calc Af Amer: 106 mL/min/{1.73_m2} (ref >59)
GFR calc non Af Amer: 92 mL/min/{1.73_m2} (ref >59)
Globulin, Total: 2.5 g/dL (ref 1.5–4.5)
Glucose: 85 mg/dL (ref 65–99)
Potassium: 3.9 mmol/L (ref 3.5–5.2)
Sodium: 140 mmol/L (ref 134–144)
Total Protein: 7.2 g/dL (ref 6.0–8.5)

## 2019-06-13 LAB — CBC
Hematocrit: 48 % (ref 37.5–51.0)
Hemoglobin: 16 g/dL (ref 13.0–17.7)
MCH: 31.7 pg (ref 26.6–33.0)
MCHC: 33.3 g/dL (ref 31.5–35.7)
MCV: 95 fL (ref 79–97)
Platelets: 270 10*3/uL (ref 150–450)
RBC: 5.05 x10E6/uL (ref 4.14–5.80)
RDW: 12.6 % (ref 11.6–15.4)
WBC: 4.5 10*3/uL (ref 3.4–10.8)

## 2019-06-13 LAB — CYCLIC CITRUL PEPTIDE ANTIBODY, IGG/IGA: Cyclic Citrullin Peptide Ab: 3 units (ref 0–19)

## 2019-06-13 LAB — C-REACTIVE PROTEIN: CRP: <1 mg/L (ref 0–10)

## 2019-06-13 LAB — RHEUMATOID FACTOR: Rheumatoid fact SerPl-aCnc: <10 IU/mL (ref 0.0–13.9)

## 2019-06-13 LAB — SEDIMENTATION RATE: Sed Rate: 7 mm/hr (ref 0–15)

## 2019-06-13 LAB — ANA: Anti Nuclear Antibody (ANA): NEGATIVE

## 2019-06-22 ENCOUNTER — Telehealth: Payer: Self-pay | Admitting: Diagnostic Neuroimaging

## 2019-06-22 NOTE — Telephone Encounter (Signed)
Called mother who stated last Friday the patient had an episode of imbalance, and was not coherent. She stated he's been better over the weekend and today. He hadn't missed any medication and stays well hydrated. I advised her per April note the patient has staring episodes several days a week and sometimes several times a day. I advised she monitor him and call for any sudden changes, increased episodes, questions or concerns. We scheduled his FU and I  Advised he can be seen for sooner FU if needed. I advised will let Dr Leta Baptist know and call her back if he has further recommendations. She  verbalized understanding, appreciation.

## 2019-06-22 NOTE — Telephone Encounter (Signed)
Pt's mother called stating that Friday he was not coherent when they were washing windows and she would like to talk to the RN about this. Please advise.

## 2019-06-24 NOTE — Telephone Encounter (Signed)
Noted. Monitor for now. _VRP

## 2019-07-02 ENCOUNTER — Other Ambulatory Visit: Payer: Self-pay | Admitting: Sports Medicine

## 2019-07-06 ENCOUNTER — Telehealth: Payer: Self-pay | Admitting: Internal Medicine

## 2019-07-06 ENCOUNTER — Other Ambulatory Visit: Payer: Medicare Other

## 2019-07-06 ENCOUNTER — Other Ambulatory Visit (INDEPENDENT_AMBULATORY_CARE_PROVIDER_SITE_OTHER): Payer: Medicare Other

## 2019-07-06 DIAGNOSIS — Z0000 Encounter for general adult medical examination without abnormal findings: Secondary | ICD-10-CM

## 2019-07-06 LAB — URINALYSIS, ROUTINE W REFLEX MICROSCOPIC
Bilirubin Urine: NEGATIVE
Hgb urine dipstick: NEGATIVE
Ketones, ur: NEGATIVE
Leukocytes,Ua: NEGATIVE
Nitrite: NEGATIVE
RBC / HPF: NONE SEEN (ref 0–?)
Specific Gravity, Urine: 1.025 (ref 1.000–1.030)
Total Protein, Urine: NEGATIVE
Urine Glucose: NEGATIVE
Urobilinogen, UA: 0.2 (ref 0.0–1.0)
WBC, UA: NONE SEEN (ref 0–?)
pH: 6.5 (ref 5.0–8.0)

## 2019-07-06 LAB — CBC WITH DIFFERENTIAL/PLATELET
Basophils Absolute: 0 10*3/uL (ref 0.0–0.1)
Basophils Relative: 1 % (ref 0.0–3.0)
Eosinophils Absolute: 0.2 10*3/uL (ref 0.0–0.7)
Eosinophils Relative: 3.7 % (ref 0.0–5.0)
HCT: 45.5 % (ref 39.0–52.0)
Hemoglobin: 15.6 g/dL (ref 13.0–17.0)
Lymphocytes Relative: 30.4 % (ref 12.0–46.0)
Lymphs Abs: 1.4 10*3/uL (ref 0.7–4.0)
MCHC: 34.2 g/dL (ref 30.0–36.0)
MCV: 94.2 fl (ref 78.0–100.0)
Monocytes Absolute: 0.5 10*3/uL (ref 0.1–1.0)
Monocytes Relative: 9.8 % (ref 3.0–12.0)
Neutro Abs: 2.6 10*3/uL (ref 1.4–7.7)
Neutrophils Relative %: 55.1 % (ref 43.0–77.0)
Platelets: 240 10*3/uL (ref 150.0–400.0)
RBC: 4.83 Mil/uL (ref 4.22–5.81)
RDW: 12.6 % (ref 11.5–15.5)
WBC: 4.7 10*3/uL (ref 4.0–10.5)

## 2019-07-06 LAB — BASIC METABOLIC PANEL
BUN: 19 mg/dL (ref 6–23)
CO2: 30 mEq/L (ref 19–32)
Calcium: 9.7 mg/dL (ref 8.4–10.5)
Chloride: 102 mEq/L (ref 96–112)
Creatinine, Ser: 1.05 mg/dL (ref 0.40–1.50)
GFR: 83.68 mL/min (ref >60.00)
Glucose, Bld: 89 mg/dL (ref 70–99)
Potassium: 4.7 mEq/L (ref 3.5–5.1)
Sodium: 138 mEq/L (ref 135–145)

## 2019-07-06 LAB — LIPID PANEL
Cholesterol: 156 mg/dL (ref 0–200)
HDL: 32.9 mg/dL — ABNORMAL LOW (ref >39.00)
LDL Cholesterol: 101 mg/dL — ABNORMAL HIGH (ref 0–99)
NonHDL: 123.45
Total CHOL/HDL Ratio: 5
Triglycerides: 110 mg/dL (ref 0.0–149.0)
VLDL: 22 mg/dL (ref 0.0–40.0)

## 2019-07-06 LAB — HEPATIC FUNCTION PANEL
ALT: 26 U/L (ref 0–53)
AST: 15 U/L (ref 0–37)
Albumin: 4.6 g/dL (ref 3.5–5.2)
Alkaline Phosphatase: 76 U/L (ref 39–117)
Bilirubin, Direct: 0.1 mg/dL (ref 0.0–0.3)
Total Bilirubin: 0.7 mg/dL (ref 0.2–1.2)
Total Protein: 7 g/dL (ref 6.0–8.3)

## 2019-07-06 LAB — TSH: TSH: 0.99 u[IU]/mL (ref 0.35–4.50)

## 2019-07-06 NOTE — Telephone Encounter (Signed)
Patient went down to the lab this morning for bloodwork prior to his physical. There were no orders entered.  Brooke went ahead and drew blood and did a urine for the basic panels. Can labs be entered for him please? If there is anything other than what she drew, he will need to have those re-drawn on Friday while he is here in the office.

## 2019-07-10 ENCOUNTER — Ambulatory Visit (INDEPENDENT_AMBULATORY_CARE_PROVIDER_SITE_OTHER): Payer: Medicare Other | Admitting: Internal Medicine

## 2019-07-10 ENCOUNTER — Encounter: Payer: Self-pay | Admitting: Internal Medicine

## 2019-07-10 ENCOUNTER — Other Ambulatory Visit: Payer: Self-pay

## 2019-07-10 VITALS — BP 118/78 | HR 93 | Temp 98.1°F | Ht 71.0 in | Wt 228.0 lb

## 2019-07-10 DIAGNOSIS — Z114 Encounter for screening for human immunodeficiency virus [HIV]: Secondary | ICD-10-CM

## 2019-07-10 DIAGNOSIS — Z23 Encounter for immunization: Secondary | ICD-10-CM

## 2019-07-10 DIAGNOSIS — E611 Iron deficiency: Secondary | ICD-10-CM

## 2019-07-10 DIAGNOSIS — E538 Deficiency of other specified B group vitamins: Secondary | ICD-10-CM

## 2019-07-10 DIAGNOSIS — Z0000 Encounter for general adult medical examination without abnormal findings: Secondary | ICD-10-CM | POA: Diagnosis not present

## 2019-07-10 DIAGNOSIS — E559 Vitamin D deficiency, unspecified: Secondary | ICD-10-CM

## 2019-07-10 NOTE — Assessment & Plan Note (Signed)

## 2019-07-10 NOTE — Patient Instructions (Signed)

## 2019-07-10 NOTE — Progress Notes (Signed)
Subjective:    Patient ID: Christopher Reed, male    DOB: [birth year], 28 y.o.   MRN: [id number]  HPI  Here for wellness and f/u;  Overall doing ok;  Pt denies Chest pain, worsening SOB, DOE, wheezing, orthopnea, PND, worsening LE edema, palpitations, dizziness or syncope.  Pt denies neurological change such as new headache, facial or extremity weakness.  Pt denies polydipsia, polyuria, or low sugar symptoms. Pt states overall good compliance with treatment and medications, good tolerability, and has been trying to follow appropriate diet.  Pt denies worsening depressive symptoms, suicidal ideation or panic. No fever, night sweats, wt loss, loss of appetite, or other constitutional symptoms.  Pt states good ability with ADL's, has low fall risk, home safety reviewed and adequate, no other significant changes in hearing or vision, and only occasionally active with exercise.  No new complaints Past Medical History:  Diagnosis Date   Anxiety    Seizures ([place])    epilepsy since age 19yrs   SINUSITIS- ACUTE-NOS 09/25/2010   TESTICULAR MASS 11/09/2009   UTI 11/09/2009   Past Surgical History:  Procedure Laterality Date   ANKLE ARTHROSCOPY Left    DG THUMB RIGHT HAND ([place] HX)     2-3 yrs ago 2015 or 2016   Knee surgury  03/2006   Right knee - meniscus tear/minor ACL tear   LIGAMENT REPAIR Right 05/07/2013   Procedure: cheilectomy, microfracture of metacarpal head surface right thumb;  Surgeon: Cammie Sickle., MD;  Location: [facility];  Service: Orthopedics;  Laterality: Right;   MYRINGOTOMY WITH TUBE PLACEMENT     as child   TOE SURGERY Right     reports that he has never smoked. He has never used smokeless tobacco. He reports that he does not drink alcohol or use drugs. family history includes Arthritis in an other family member; Diabetes in his father and another family member; Hepatitis C in his paternal grandmother; Hypertension in his father, mother, and  another family member; Other in his maternal grandfather; Pneumonia in his paternal grandfather. Allergies  Allergen Reactions   Bee Venom     angioedema   Current Outpatient Medications on File Prior to Visit  Medication Sig Dispense Refill   EPINEPHrine (EPIPEN) 0.3 mg/0.3 mL IJ SOAJ injection Inject 0.3 mLs (0.3 mg total) into the muscle once. 2 Device 1   ethosuximide (ZARONTIN) 250 MG capsule TAKE 1 CAPSULE BY MOUTH TWO TIMES DAILY 180 capsule 3   fluticasone (FLONASE) 50 MCG/ACT nasal spray Place 2 sprays into both nostrils daily. 16 g 6   LAMICTAL 200 MG tablet TAKE 2 TABLETS BY MOUTH  EVERY MORNING AND 3.5 TABS BY MOUTH EVERY  EVENING; BRAND MEDICALLY NECESSARY 500 tablet 4   meloxicam (MOBIC) 15 MG tablet Take one pill a day with food for 7 days and then prn thereafter 40 tablet 0   No current facility-administered medications on file prior to visit.    Review of Systems Constitutional: Negative for other unusual diaphoresis, sweats, appetite or weight changes HENT: Negative for other worsening hearing loss, ear pain, facial swelling, mouth sores or neck stiffness.   Eyes: Negative for other worsening pain, redness or other visual disturbance.  Respiratory: Negative for other stridor or swelling Cardiovascular: Negative for other palpitations or other chest pain  Gastrointestinal: Negative for worsening diarrhea or loose stools, blood in stool, distention or other pain Genitourinary: Negative for hematuria, flank pain or other change in urine volume.  Musculoskeletal: Negative  for myalgias or other joint swelling.  Skin: Negative for other color change, or other wound or worsening drainage.  Neurological: Negative for other syncope or numbness. Hematological: Negative for other adenopathy or swelling Psychiatric/Behavioral: Negative for hallucinations, other worsening agitation, SI, self-injury, or new decreased concentration All otherwise neg per pt    Objective:    Physical Exam BP 118/78    Pulse 93    Temp 98.1 F (36.7 C) (Oral)    Ht 5\' 11"  (1.803 m)    Wt 228 lb (103.4 kg)    SpO2 99%    BMI 31.80 kg/m  VS noted,  Constitutional: Pt is oriented to person, place, and time. Appears well-developed and well-nourished, in no significant distress and comfortable Head: Normocephalic and atraumatic  Eyes: Conjunctivae and EOM are normal. Pupils are equal, round, and reactive to light Right Ear: External ear normal without discharge Left Ear: External ear normal without discharge Nose: Nose without discharge or deformity Mouth/Throat: Oropharynx is without other ulcerations and moist  Neck: Normal range of motion. Neck supple. No JVD present. No tracheal deviation present or significant neck LA or mass Cardiovascular: Normal rate, regular rhythm, normal heart sounds and intact distal pulses.   Pulmonary/Chest: WOB normal and breath sounds without rales or wheezing  Abdominal: Soft. Bowel sounds are normal. NT. No HSM  Musculoskeletal: Normal range of motion. Exhibits no edema Lymphadenopathy: Has no other cervical adenopathy.  Neurological: Pt is alert and oriented to person, place, and time. Pt has normal reflexes. No cranial nerve deficit. Motor grossly intact, Gait intact Skin: Skin is warm and dry. No rash noted or new ulcerations Psychiatric:  Has normal mood and affect. Behavior is normal without agitation All otherwise neg per pt Lab Results  Component Value Date   WBC 4.7 07/06/2019   HGB 15.6 07/06/2019   HCT 45.5 07/06/2019   PLT 240.0 07/06/2019   GLUCOSE 89 07/06/2019   CHOL 156 07/06/2019   TRIG 110.0 07/06/2019   HDL 32.90 (L) 07/06/2019   LDLCALC 101 (H) 07/06/2019   ALT 26 07/06/2019   AST 15 07/06/2019   NA 138 07/06/2019   K 4.7 07/06/2019   CL 102 07/06/2019   CREATININE 1.05 07/06/2019   BUN 19 07/06/2019   CO2 30 07/06/2019   TSH 0.99 07/06/2019         Assessment & Plan:

## 2019-07-12 ENCOUNTER — Other Ambulatory Visit: Payer: Self-pay | Admitting: Diagnostic Neuroimaging

## 2019-07-31 ENCOUNTER — Other Ambulatory Visit: Payer: Self-pay | Admitting: Diagnostic Neuroimaging

## 2019-08-03 ENCOUNTER — Telehealth: Payer: Self-pay | Admitting: Diagnostic Neuroimaging

## 2019-08-03 MED ORDER — ETHOSUXIMIDE 250 MG PO CAPS: ORAL_CAPSULE | ORAL | 1 refills | Status: DC

## 2019-08-03 NOTE — Addendum Note (Signed)
Addended by: Florian Buff C on: 08/03/2019 01:10 PM   Modules accepted: Orders

## 2019-08-03 NOTE — Telephone Encounter (Addendum)
Received call back from patient who stated he stopped using [facility], uses [facility], [address]. His current bottle of Zarontin is from [facility]. He asked for refill to be sent there. I advised him we can refill. Patient verbalized understanding, appreciation.

## 2019-08-03 NOTE — Telephone Encounter (Signed)
LVM informing the patient Dr Leta Baptist refilled zarontin on 03/23/19 for 3 month supply with 3 additional refills to [facility]. Advised he let us know if [facility] doesn't have Rx on file. Left number.

## 2019-08-03 NOTE — Telephone Encounter (Signed)
Pt called wanting to know why his ethosuximide (ZARONTIN) 250 MG capsule refill was denied. Please advise.

## 2019-08-20 ENCOUNTER — Ambulatory Visit: Payer: Medicare Other | Admitting: Podiatry

## 2019-08-27 ENCOUNTER — Ambulatory Visit (INDEPENDENT_AMBULATORY_CARE_PROVIDER_SITE_OTHER): Payer: Medicare Other | Admitting: Podiatry

## 2019-08-27 ENCOUNTER — Ambulatory Visit (INDEPENDENT_AMBULATORY_CARE_PROVIDER_SITE_OTHER): Payer: Medicare Other

## 2019-08-27 ENCOUNTER — Other Ambulatory Visit: Payer: Self-pay

## 2019-08-27 ENCOUNTER — Encounter: Payer: Self-pay | Admitting: Podiatry

## 2019-08-27 ENCOUNTER — Other Ambulatory Visit: Payer: Self-pay | Admitting: Podiatry

## 2019-08-27 VITALS — BP 115/78

## 2019-08-27 DIAGNOSIS — M722 Plantar fascial fibromatosis: Secondary | ICD-10-CM

## 2019-08-27 DIAGNOSIS — M79671 Pain in right foot: Secondary | ICD-10-CM | POA: Diagnosis not present

## 2019-08-27 DIAGNOSIS — L309 Dermatitis, unspecified: Secondary | ICD-10-CM

## 2019-08-27 NOTE — Patient Instructions (Signed)

## 2019-08-28 NOTE — Progress Notes (Signed)
Subjective:   Patient ID: Christopher Reed, male   DOB: 28 y.o.   MRN: [id number]   HPI Patient presents stating he has had pain in his heels of both feet for several years and had previous injections done a long time ago by Dr. Gershon Mussel and states it seems like he gets 6 to 9 months of relief.  Patient has family history of condition and does not smoke and works standing job and likes to be active   Review of Systems  All other systems reviewed and are negative.       Objective:  Physical Exam Vitals signs and nursing note reviewed.  Constitutional:      Appearance: He is well-developed.  Pulmonary:     Effort: Pulmonary effort is normal.  Musculoskeletal: Normal range of motion.  Skin:    General: Skin is warm.  Neurological:     Mental Status: He is alert.     Neurovascular status found to be intact muscle strength was found to be adequate range of motion was within normal limits with moderate depression of the arch bilateral.  Patient is noted to have exquisite discomfort plantar heel region bilateral with inflammation fluid in the medial band and is noted to have good digital perfusion and well oriented.     Assessment:  What appears to be acute plantar fasciitis bilateral with probable chronic nature to condition that seems to get periods of relief with reoccurrence gradually over time     Plan:  H&P x-rays reviewed and do not recommend surgical intervention in this case but I do recommend we continue to treat as an acute condition.  I did sterile prep and injected the plantar fascial bilateral 3 mg Kenalog 5 mg Xylocaine and applied fascial brace bilateral and gave instructions for physical therapy shoe gear modification.  Patient will be seen back to recheck  X-rays indicate that there is no indication of current spurring mild depression of the arch and no other pathology of the foot noted

## 2019-10-12 ENCOUNTER — Telehealth: Payer: Self-pay | Admitting: *Deleted

## 2019-10-12 ENCOUNTER — Encounter: Payer: Self-pay | Admitting: Diagnostic Neuroimaging

## 2019-10-12 ENCOUNTER — Telehealth (INDEPENDENT_AMBULATORY_CARE_PROVIDER_SITE_OTHER): Payer: Medicare Other | Admitting: Diagnostic Neuroimaging

## 2019-10-12 ENCOUNTER — Ambulatory Visit: Payer: Self-pay | Admitting: Diagnostic Neuroimaging

## 2019-10-12 DIAGNOSIS — R4689 Other symptoms and signs involving appearance and behavior: Secondary | ICD-10-CM

## 2019-10-12 DIAGNOSIS — G40309 Generalized idiopathic epilepsy and epileptic syndromes, not intractable, without status epilepticus: Secondary | ICD-10-CM | POA: Diagnosis not present

## 2019-10-12 MED ORDER — ETHOSUXIMIDE 250 MG PO CAPS: 250.0000 mg | ORAL_CAPSULE | Freq: Two times a day (BID) | ORAL | 4 refills | Status: DC

## 2019-10-12 MED ORDER — LAMICTAL 200 MG PO TABS: ORAL_TABLET | ORAL | 4 refills | Status: DC

## 2019-10-12 NOTE — Progress Notes (Signed)
    Virtual Visit via Video Note  I connected with Christopher Reed on 10/12/19 at  2:00 PM EST by a video enabled telemedicine application and verified that I am speaking with the correct person using two identifiers.   I discussed the limitations of evaluation and management by telemedicine and the availability of in person appointments. The patient expressed understanding and agreed to proceed.  Patient is at their home. I am at the office.    History of Present Illness:  - since last visit, doing well - staring spells continue; similar frequency since last visit (few days per weekl; few times per day) - tolerating meds    Observations/Objective:  VIDEO EXAM  GENERAL EXAM/CONSTITUTIONAL:  Vitals: There were no vitals filed for this visit.  There is no height or weight on file to calculate BMI. Wt Readings from Last 3 Encounters:  07/10/19 228 lb (103.4 kg)  06/11/19 220 lb (99.8 kg)  05/26/18 210 lb (95.3 kg)     Patient is in no distress; well developed, nourished and groomed; neck is supple   NEUROLOGIC: MENTAL STATUS:  No flowsheet data found.  awake, alert, oriented to person, place and time  recent and remote memory intact  normal attention and concentration  language fluent, comprehension intact, naming intact  fund of knowledge appropriate  CRANIAL NERVE:   2nd, 3rd, 4th, 6th - visual fields full to confrontation, extraocular muscles intact, no nystagmus  5th - facial sensation symmetric  7th - facial strength symmetric  8th - hearing intact  11th - shoulder shrug symmetric  12th - tongue protrusion midline  MOTOR:   NO TREMOR; NO DRIFT IN BUE  SENSORY:   normal and symmetric to light touch  COORDINATION:   fine finger movements normal    Assessment and Plan:   28 y.o. malehere with history of seizures since age 73 years old, likely primary generalized epilepsy. Patient has had intermittent grand mal seizures, last  occurring in 2016. Now with ongoing intermittent daily spells, decreased attention, decreased processing speed. These were raised as possibly nonepileptic spells.  Last grand mal seizure: Nov 2016  Daily staring spells.  Meds tried: lamictal, zarontin, keppra   Dx:  1. 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 Spell of abnormal behavior     SEIZURE DISORDER (established, stable) - continue lamictal 400mg  (2 x 200) in AM and 700mg  (3.5 x 200) in PM (brand medically necessary) - continue ETHOSUXAMIDE 250mg  twice a day - in future may consider transition to depakote, which is very effective for primary generalized epilepsy; may consider slow outpatient titration vs elective EMU admission to characterize spells and consider more rapid titration; may consider EMU admission in Fall 2021.   Follow Up Instructions:  - Return in about 9 months (around 07/12/2020).    I discussed the assessment and treatment plan with the patient. The patient was provided an opportunity to ask questions and all were answered. The patient agreed with the plan and demonstrated an understanding of the instructions.   The patient was advised to call back or seek an in-person evaluation if the symptoms worsen or if the condition fails to improve as anticipated.  I provided 15 minutes of non-face-to-face time during this encounter.    Penni Bombard, MD [id number], [id number] PM Certified in Neurology, Neurophysiology and Neuroimaging  [facility] [address] [employer] [place] [place], [place] [address] [phone]

## 2019-10-12 NOTE — Telephone Encounter (Signed)
Called patient and informed him Dr Leta Baptist is out of the office today. We need to change his follow up to my chart VV. I reactivated his my chart, advised he'll get e mail with appointment information. He has used my chart VV in past, verbalized understanding, appreciation.

## 2019-12-31 ENCOUNTER — Ambulatory Visit: Payer: Medicare Other | Attending: Family

## 2019-12-31 DIAGNOSIS — Z23 Encounter for immunization: Secondary | ICD-10-CM

## 2019-12-31 NOTE — Progress Notes (Signed)
   Covid-19 Vaccination Clinic  Name:  Christopher Reed    MRN: [id number] DOB: [birth year]  12/31/2019  [name] was observed post Covid-19 immunization for 15 minutes without incident. He was provided with Vaccine Information Sheet and instruction to access the V-Safe system.   [name] was instructed to call 911 with any severe reactions post vaccine: Marland Kitchen Difficulty breathing  . Swelling of face and throat  . A fast heartbeat  . A bad rash all over body  . Dizziness and weakness   Immunizations Administered    Name Date Dose VIS Date Route   Moderna COVID-19 Vaccine 12/31/2019  1:43 PM 0.5 mL 09/22/2019 Intramuscular   Manufacturer: Moderna   Lot: [id number]   [id number]

## 2020-02-02 ENCOUNTER — Ambulatory Visit: Payer: Medicare Other | Attending: Family

## 2020-02-02 ENCOUNTER — Ambulatory Visit: Payer: Medicare Other

## 2020-02-02 DIAGNOSIS — Z23 Encounter for immunization: Secondary | ICD-10-CM

## 2020-02-02 NOTE — Progress Notes (Signed)
   Covid-19 Vaccination Clinic  Name:  Christopher Reed    MRN: [id number] DOB: 12-Jan-1991  02/02/2020  Christopher Reed was observed post Covid-19 immunization for 15 minutes without incident. He was provided with Vaccine Information Sheet and instruction to access the V-Safe system.   Christopher Reed was instructed to call 911 with any severe reactions post vaccine: Marland Kitchen Difficulty breathing  . Swelling of face and throat  . A fast heartbeat  . A bad rash all over body  . Dizziness and weakness   Immunizations Administered    Name Date Dose VIS Date Route   Moderna COVID-19 Vaccine 02/02/2020  1:53 PM 0.5 mL 09/22/2019 Intramuscular   Manufacturer: Moderna   Lot: [id number]   [id number]

## 2020-03-20 ENCOUNTER — Encounter (HOSPITAL_COMMUNITY): Payer: Self-pay

## 2020-03-20 ENCOUNTER — Emergency Department (HOSPITAL_COMMUNITY): Payer: Medicare Other

## 2020-03-20 ENCOUNTER — Emergency Department (HOSPITAL_COMMUNITY)
Admission: EM | Admit: 2020-03-20 | Discharge: 2020-03-20 | Disposition: A | Discharge status: Home / Self Care | Payer: Medicare Other | Attending: Emergency Medicine | Admitting: Emergency Medicine

## 2020-03-20 ENCOUNTER — Other Ambulatory Visit: Payer: Self-pay

## 2020-03-20 DIAGNOSIS — Z743 Need for continuous supervision: Secondary | ICD-10-CM | POA: Diagnosis not present

## 2020-03-20 DIAGNOSIS — J0120 Acute ethmoidal sinusitis, unspecified: Secondary | ICD-10-CM | POA: Diagnosis not present

## 2020-03-20 DIAGNOSIS — R0689 Other abnormalities of breathing: Secondary | ICD-10-CM | POA: Diagnosis not present

## 2020-03-20 DIAGNOSIS — G40909 Epilepsy, unspecified, not intractable, without status epilepticus: Secondary | ICD-10-CM | POA: Diagnosis not present

## 2020-03-20 DIAGNOSIS — Z79899 Other long term (current) drug therapy: Secondary | ICD-10-CM | POA: Insufficient documentation

## 2020-03-20 DIAGNOSIS — R410 Disorientation, unspecified: Secondary | ICD-10-CM | POA: Diagnosis not present

## 2020-03-20 DIAGNOSIS — R569 Unspecified convulsions: Secondary | ICD-10-CM | POA: Diagnosis not present

## 2020-03-20 DIAGNOSIS — R404 Transient alteration of awareness: Secondary | ICD-10-CM | POA: Diagnosis not present

## 2020-03-20 LAB — URINALYSIS, ROUTINE W REFLEX MICROSCOPIC
Bacteria, UA: NONE SEEN
Bilirubin Urine: NEGATIVE
Glucose, UA: NEGATIVE mg/dL
Ketones, ur: NEGATIVE mg/dL
Leukocytes,Ua: NEGATIVE
Nitrite: NEGATIVE
Protein, ur: 100 mg/dL — AB
Specific Gravity, Urine: 1.017 (ref 1.005–1.030)
pH: 6 (ref 5.0–8.0)

## 2020-03-20 LAB — BLOOD GAS, ARTERIAL
Acid-base deficit: 4.6 mmol/L — ABNORMAL HIGH (ref 0.0–2.0)
Bicarbonate: 18 mmol/L — ABNORMAL LOW (ref 20.0–28.0)
FIO2: 21
O2 Saturation: 96.8 %
Patient temperature: 98.6
pCO2 arterial: 28.4 mmHg — ABNORMAL LOW (ref 32.0–48.0)
pH, Arterial: 7.418 (ref 7.350–7.450)
pO2, Arterial: 84.9 mmHg (ref 83.0–108.0)

## 2020-03-20 LAB — COMPREHENSIVE METABOLIC PANEL
ALT: 29 U/L (ref 0–44)
AST: 24 U/L (ref 15–41)
Albumin: 5.1 g/dL — ABNORMAL HIGH (ref 3.5–5.0)
Alkaline Phosphatase: 84 U/L (ref 38–126)
Anion gap: 22 — ABNORMAL HIGH (ref 5–15)
BUN: 16 mg/dL (ref 6–20)
CO2: 13 mmol/L — ABNORMAL LOW (ref 22–32)
Calcium: 9.5 mg/dL (ref 8.9–10.3)
Chloride: 102 mmol/L (ref 98–111)
Creatinine, Ser: 1.48 mg/dL — ABNORMAL HIGH (ref 0.61–1.24)
GFR calc Af Amer: >60 mL/min (ref >60)
GFR calc non Af Amer: >60 mL/min (ref >60)
Glucose, Bld: 135 mg/dL — ABNORMAL HIGH (ref 70–99)
Potassium: 3.6 mmol/L (ref 3.5–5.1)
Sodium: 137 mmol/L (ref 135–145)
Total Bilirubin: 0.9 mg/dL (ref 0.3–1.2)
Total Protein: 8 g/dL (ref 6.5–8.1)

## 2020-03-20 LAB — CBC WITH DIFFERENTIAL/PLATELET
Abs Immature Granulocytes: 0.1 10*3/uL — ABNORMAL HIGH (ref 0.00–0.07)
Basophils Absolute: 0.1 10*3/uL (ref 0.0–0.1)
Basophils Relative: 1 %
Eosinophils Absolute: 0.2 10*3/uL (ref 0.0–0.5)
Eosinophils Relative: 2 %
HCT: 49.5 % (ref 39.0–52.0)
Hemoglobin: 16.5 g/dL (ref 13.0–17.0)
Immature Granulocytes: 1 %
Lymphocytes Relative: 36 %
Lymphs Abs: 2.8 10*3/uL (ref 0.7–4.0)
MCH: 32 pg (ref 26.0–34.0)
MCHC: 33.3 g/dL (ref 30.0–36.0)
MCV: 96.1 fL (ref 80.0–100.0)
Monocytes Absolute: 0.6 10*3/uL (ref 0.1–1.0)
Monocytes Relative: 8 %
Neutro Abs: 3.9 10*3/uL (ref 1.7–7.7)
Neutrophils Relative %: 52 %
Platelets: 311 10*3/uL (ref 150–400)
RBC: 5.15 MIL/uL (ref 4.22–5.81)
RDW: 11.8 % (ref 11.5–15.5)
WBC: 7.7 10*3/uL (ref 4.0–10.5)
nRBC: 0 % (ref 0.0–0.2)

## 2020-03-20 LAB — ETHANOL: Alcohol, Ethyl (B): <10 mg/dL (ref <10)

## 2020-03-20 LAB — RAPID URINE DRUG SCREEN, HOSP PERFORMED
Amphetamines: NEGATIVE — AB
Barbiturates: NEGATIVE — AB
Benzodiazepines: POSITIVE — AB
Cocaine: NEGATIVE — AB
Opiates: NEGATIVE — AB
Tetrahydrocannabinol: NEGATIVE — AB

## 2020-03-20 MED ORDER — AMOXICILLIN-POT CLAVULANATE 875-125 MG PO TABS
1.0000 | ORAL_TABLET | Freq: Two times a day (BID) | ORAL | 0 refills | Status: DC
Start: 2020-03-20 — End: 2020-04-04

## 2020-03-20 MED ORDER — SODIUM CHLORIDE 0.9 % IV BOLUS (SEPSIS)
1000.0000 mL | Freq: Once | INTRAVENOUS | Status: AC
  Administered 2020-03-20: 1000 mL via INTRAVENOUS

## 2020-03-20 MED ORDER — SODIUM CHLORIDE 0.9 % IV SOLN: 1000.0000 mL | INTRAVENOUS | Status: DC

## 2020-03-20 NOTE — ED Provider Notes (Signed)
[facility] DEPT Provider Note   CSN: [id number] Arrival date & time: 03/20/20  1234     History Chief Complaint  Patient presents with  . Seizures    Christopher Reed is a 29 y.o. male.  HPI Patient has known history of seizure disorder.  He is compliant with medications.  He denies he has been ill recently.  He had just been visiting at his sister's house.  He was sitting in the passenger seat of his mother's car.  His sister was talking to them when all of a sudden he stiffened and started having seizure activity.  It lasted less than 2 minutes.  Patient sister reports however it looks is slightly different in quality to his typical seizures.  Facial expression and body movements seemed somewhat different.  He was also more combative and confused in the postictal phase.  Patient was given Versed 2.5 mg IM by EMS for transport.  After arrival, patient was very drowsy but answering questions.  He reports he does not remember anything after the seizure happened.  He denies he has a headache.  He reports slight frontal pressure.  He reports he typically has a much more severe headache after his seizures.  No focal weakness numbness or tingling.  Denies he has been sick recently.  He does however endorse some sinus congestion and drainage after we discussed CT results.  No fevers or visual changes.    Past Medical History:  Diagnosis Date  . Anxiety   . Seizures ([place])    epilepsy since age 43yrs  . SINUSITIS- ACUTE-NOS 09/25/2010  . TESTICULAR MASS 11/09/2009  . UTI 11/09/2009    Patient Active Problem List   Diagnosis Date Noted  . Protein in urine 06/15/2016  . Angioedema 06/10/2014  . Generalized convulsive epilepsy without mention of intractable epilepsy 03/16/2013  . Generalized nonconvulsive epilepsy without mention of intractable epilepsy 03/16/2013  . Transient alteration of awareness 03/16/2013  . Essential and other specified forms of tremor  03/16/2013  . Encounter for long-term (current) use of other medications 03/16/2013  . Head injury, unspecified 03/16/2013  . Other malaise and fatigue 03/16/2013  . Anxiety   . Preventative health care 10/27/2011    Past Surgical History:  Procedure Laterality Date  . ANKLE ARTHROSCOPY Left   . DG THUMB RIGHT HAND ([facility] HX)     2-3 yrs ago 2015 or 2016  . Knee surgury  03/2006   Right knee - meniscus tear/minor ACL tear  . LIGAMENT REPAIR Right 05/07/2013   Procedure: cheilectomy, microfracture of metacarpal head surface right thumb;  Surgeon: Cammie Sickle., MD;  Location: [facility];  Service: Orthopedics;  Laterality: Right;  . MYRINGOTOMY WITH TUBE PLACEMENT     as child  . TOE SURGERY Right        Family History  Problem Relation Age of Onset  . Diabetes Other        1st degree relative  . Arthritis Other   . Hypertension Other   . Other Maternal Grandfather        Spinal Meningitis-Died at 36  . Hepatitis C Paternal Grandmother        Died at 87  . Pneumonia Paternal Grandfather        Died at 11  . Hypertension Mother   . Hypertension Father   . Diabetes Father        type 2    Social History   Tobacco  Use  . Smoking status: Never Smoker  . Smokeless tobacco: Never Used  Substance Use Topics  . Alcohol use: No  . Drug use: No    Home Medications Prior to Admission medications   Medication Sig Start Date End Date Taking? Authorizing Provider  EPINEPHrine (EPIPEN) 0.3 mg/0.3 mL IJ SOAJ injection Inject 0.3 mLs (0.3 mg total) into the muscle once. 06/10/14  Yes Biagio Borg, MD  ethosuximide (ZARONTIN) 250 MG capsule Take 1 capsule (250 mg total) by mouth 2 (two) times daily. 1 cap PO 2 x daily Patient taking differently: Take 250 mg by mouth 2 (two) times daily.  10/12/19  Yes Penumalli, Earlean Polka, MD  LAMICTAL 200 MG tablet Take 2 tablets (400 mg total) by mouth daily AND 3.5 tablets (700 mg total) every evening. BRAND MEDICALLY  NECESSARY. 10/12/19  Yes Penumalli, Earlean Polka, MD  amoxicillin-clavulanate (AUGMENTIN) 875-125 MG tablet Take 1 tablet by mouth 2 (two) times daily. One po bid x 7 days 03/20/20   Charlesetta Shanks, MD  fluticasone [facility]) 50 MCG/ACT nasal spray Place 2 sprays into both nostrils daily. Patient not taking: Reported on 03/20/2020 09/25/17   Marrian Salvage, FNP  meloxicam [facility]) 15 MG tablet Take one pill a day with food for 7 days and then prn thereafter Patient not taking: Reported on 03/20/2020 06/11/19   Thurman Coyer, DO    Allergies    Bee venom  Review of Systems   Review of Systems 10 systems reviewed and negative except as per HPI Physical Exam Updated Vital Signs BP 132/77 (BP Location: Right Arm)   Pulse 95   Temp 97.7 F (36.5 C) (Oral)   Resp 16   SpO2 100%   Physical Exam Constitutional:      Comments: On arrival patient is still somnolent but awakens and answers questions appropriately.  HENT:     Head: Normocephalic and atraumatic.     Nose: Nose normal.     Mouth/Throat:     Comments: No dental or intraoral injury.  Airway is patent without any pooling secretions. Eyes:     Extraocular Movements: Extraocular movements intact.     Conjunctiva/sclera: Conjunctivae normal.     Pupils: Pupils are equal, round, and reactive to light.  Cardiovascular:     Rate and Rhythm: Normal rate and regular rhythm.     Heart sounds: Normal heart sounds.  Pulmonary:     Effort: Pulmonary effort is normal.     Breath sounds: Normal breath sounds.  Abdominal:     General: There is no distension.     Palpations: Abdomen is soft.     Tenderness: There is no abdominal tenderness. There is no guarding.  Musculoskeletal:        General: No swelling or tenderness. Normal range of motion.     Cervical back: Neck supple.     Right lower leg: No edema.     Left lower leg: No edema.  Skin:    General: Skin is warm and dry.  Neurological:     Comments: On first arrival  patient is somnolent but awakens and answers questions appropriately.  He follows all commands appropriately.  Speech is situationally oriented.  Grip strengths are symmetric.  Lower extremity strength and movement symmetric.  Endorses normal sensation to light touch x4 extremities.  Psychiatric:        Mood and Affect: Mood normal.     ED Results / Procedures / Treatments   Labs (all labs  ordered are listed, but only abnormal results are displayed) Labs Reviewed  COMPREHENSIVE METABOLIC PANEL - Abnormal; Notable for the following components:      Result Value   CO2 13 (*)    Glucose, Bld 135 (*)    Creatinine, Ser 1.48 (*)    Albumin 5.1 (*)    Anion gap 22 (*)    All other components within normal limits  CBC WITH DIFFERENTIAL/PLATELET - Abnormal; Notable for the following components:   Abs Immature Granulocytes 0.10 (*)    All other components within normal limits  BLOOD GAS, ARTERIAL - Abnormal; Notable for the following components:   pCO2 arterial 28.4 (*)    Bicarbonate 18.0 (*)    Acid-base deficit 4.6 (*)    All other components within normal limits  ETHANOL  URINALYSIS, ROUTINE W REFLEX MICROSCOPIC  RAPID URINE DRUG SCREEN, HOSP PERFORMED    EKG None  Radiology CT Head Wo Contrast  Result Date: 03/20/2020 CLINICAL DATA:  Seizure, grand mal type EXAM: CT HEAD WITHOUT CONTRAST TECHNIQUE: Contiguous axial images were obtained from the base of the skull through the vertex without intravenous contrast. COMPARISON:  February 05, 2011 FINDINGS: Brain: The ventricles and sulci are normal in size and configuration. There is no intracranial mass, hemorrhage, extra-axial fluid collection, or midline shift. The brain parenchyma appears unremarkable. There is no evident acute infarct. Vascular: No hyperdense vessel.  No vascular calcification evident. Skull: Bony calvarium appears intact. Sinuses/Orbits: There is mucosal thickening and opacification in several ethmoid air cells. Other  visualized paranasal sinuses are clear. Visualized orbits appear symmetric bilaterally. Other: Mastoid air cells are clear. IMPRESSION: Foci of paranasal sinus disease. Study otherwise unremarkable. Note that brain parenchyma appears unremarkable. No mass or hemorrhage. Electronically Signed   By: Lowella Grip III M.D.   On: 03/20/2020 14:01    Procedures Procedures (including critical care time)  Medications Ordered in ED Medications  sodium chloride 0.9 % bolus 1,000 mL (1,000 mLs Intravenous New Bag/Given 03/20/20 1510)    Followed by  sodium chloride 0.9 % bolus 1,000 mL (1,000 mLs Intravenous New Bag/Given 03/20/20 1510)    Followed by  0.9 %  sodium chloride infusion (has no administration in time range)    ED Course  I have reviewed the triage vital signs and the nursing notes.  Pertinent labs & imaging results that were available during my care of the patient were reviewed by me and considered in my medical decision making (see chart for details).    MDM Rules/Calculators/A&P                     At time of initial presentation, patient was still somnolent.  He would fall asleep and had periods of apnea with desaturation to the mid 80s.  He would spontaneously resume respirations and saturations returned to high 90s.  This spontaneously resolved as the patient became more alert and postictal symptoms resolved.  Suspect this was secondary to the combination of postictal state and Versed administered during transport.  No indication that the patient has any underlying respiratory dysfunction.  CT head is normal except possible sinusitis.  Patient's neurologic examination is normal.  After hydration and observation patient's mental status is clear he is awake and alert without prompting.  At this time plan will be for hydration and rest and follow-up with his neurologist day after tomorrow.  I will opt to treat with Augmentin for sinusitis given that patient does endorse recent  symptoms  and there is a positive finding on CT scan. Final Clinical Impression(s) / ED Diagnoses Final diagnoses:  Seizure disorder ([place])  Seizure ([place])  Acute ethmoidal sinusitis, recurrence not specified    Rx / DC Orders ED Discharge Orders         Ordered    amoxicillin-clavulanate (AUGMENTIN) 875-125 MG tablet  2 times daily     03/20/20 1558           Charlesetta Shanks, MD 03/20/20 1559

## 2020-03-20 NOTE — ED Triage Notes (Signed)
Pt arrived via [facility] from home CC Seizure activity. Pt's family reports grand mal seizure for less than 2 minutes, denies falls, pt was in vehicle as a passenger when seizure happened, pt compliant with medications.   5mg  IM Midalzoam given in route due to pt being combative.   18G left hand   Hx Seizure disorder. Last seizure 3 years ago. Cognitive disability

## 2020-03-20 NOTE — ED Provider Notes (Signed)
Patient signed out to me by Dr. Vallery Ridge pending IV fluids.  Patient reassessed and feels better and will follow up with his neurologist   Lacretia Leigh, MD 03/20/20 1640

## 2020-03-20 NOTE — Discharge Instructions (Addendum)
1.  Call your neurologist on Tuesday to schedule an appointment as soon as possible. 2.  Rest and stay hydrated.  Avoid any situations that would be dangerous if you had another seizure.  Take all of your medications on schedule. 3.  You have been given Augmentin to take for sinus infection.  Take twice daily as prescribed. 4.  Return to the emergency department if you have any new or concerning symptoms or recurrent seizures.

## 2020-03-20 NOTE — ED Notes (Signed)
Pt provided bedside urinal

## 2020-03-20 NOTE — ED Notes (Signed)
Called Resp for ABG. Pt aware urine sample needed

## 2020-03-20 NOTE — ED Notes (Signed)
Resp at bedside

## 2020-03-20 NOTE — ED Notes (Signed)
Pt verbalizes understanding of DC instructions. Pt belongings returned and is ambulatory out of ED.  

## 2020-04-04 ENCOUNTER — Ambulatory Visit (INDEPENDENT_AMBULATORY_CARE_PROVIDER_SITE_OTHER): Payer: Medicare Other | Admitting: Diagnostic Neuroimaging

## 2020-04-04 ENCOUNTER — Encounter: Payer: Self-pay | Admitting: Diagnostic Neuroimaging

## 2020-04-04 VITALS — BP 130/78 | HR 76 | Ht 71.0 in | Wt 229.4 lb

## 2020-04-04 DIAGNOSIS — G40309 Generalized idiopathic epilepsy and epileptic syndromes, not intractable, without status epilepticus: Secondary | ICD-10-CM

## 2020-04-04 MED ORDER — LAMICTAL 200 MG PO TABS: ORAL_TABLET | ORAL | 4 refills | Status: DC

## 2020-04-04 MED ORDER — LACOSAMIDE 50 MG PO TABS: 50.0000 mg | ORAL_TABLET | Freq: Two times a day (BID) | ORAL | 5 refills | Status: DC

## 2020-04-04 MED ORDER — ETHOSUXIMIDE 250 MG PO CAPS: 250.0000 mg | ORAL_CAPSULE | Freq: Two times a day (BID) | ORAL | 4 refills | Status: DC

## 2020-04-04 NOTE — Patient Instructions (Signed)
  SEIZURE DISORDER  - continue lamictal 400mg  (2 x 200) in AM and 700mg  (3.5 x 200) in PM (brand medically necessary) - continue ETHOSUXAMIDE 250mg  twice a day - add vimpat 50mg  twice a day  - check AED levels  HEMATURIA / increased creatinine (kidney labs) - follow up with PCP

## 2020-04-04 NOTE — Progress Notes (Signed)
Chief Complaint  Patient presents with  . Seizures    rm 7, 6 month FU  sister- Clara  "seizure 03/20/20- no missed meds, went to ED, CT scan showed sinus infection"     History of Present Illness:  UPDATE (04/04/20, VRP): Since last visit, doing well until 03/20/2020 1 patient had unprovoked seizure.  Patient was at sister's house, in the car when all of a sudden he made a clicking sound.  Then he had stiffness and generalized convulsions.  Afterwards he was very confused and combative.  Paramedics were called patient went to the emergency room for evaluation.  CT head unremarkable.  Labs unremarkable except for hematuria and elevated creatinine.  Since then patient has returned to baseline.  No further events.  He continues to have a few daily staring spells like in the past.  He is tolerating his medications.  There has been concern about possible chronic dehydration.   UPDATE (02/16/19, VRP):  - continues with daily staring spells (3-4 days per weeks; 5-7 spells per day) - had amb EEG at [facility]; some discharges noted; no clinical events; no subclincal seizures - overall stable - not driving currently; does not plan to drive in near future  UPDATE (05/26/18, VRP): Since last visit, doing about the same. Symptoms are stable. Severity is moderate. No alleviating or aggravating factors. Tolerating anti-seizure meds. Avg ~10 staring spells per day. Not clear if these are daydreaming or petit mal sz.   PRIOR HPI (07/08/17): 29 year old right-handed male here for evaluation of seizure disorder.  Patient had normal birth and development. At age 31 years old he had intermittent episodes of seizures. Patient would have staring spells as well as grand mal seizures. He would have intermittent muscle jerks. Over the years he was treated with Lamictal and this dose was increased over time. Last grand mal seizure was run 2014.  He was then developing continued problems with intermittent daily staring  spells. Therefore he was referred to [facility] video EEG for evaluation in January 2015. During the admission his Lamictal was decreased from 600 mg twice a day down to 300 mg twice a day. Patient had several stereotypical events which were confirmed to be correlated with electrographic seizure activity. He was discharged on Lamictal 400 mg twice a day with ethosuxamide 250mg  twice a day. Patient was tried on Keppra but they could not tolerate side effects. Modified Atkins diet was considered. VNS was considered. In 2016 Dr. Ginny Forth raised possibility of trying Depakote to help with better seizure control, but patient and family declined due to potential side effects.  Patient also had home sleep study which ruled out sleep apnea.  Patient had a ambulatory EEG in November 2017 which showed generalized spike and wave discharges. Patient had one staring spell without EEG correlate. Therefore it was concluded that patient staring spells may not be related to epileptic discharges. Patient last seen at [facility] epilepsy clinic in April 2018.  Now patient presenting here to establish with local neurologist.  Family is concerned about his ongoing daily staring spells, tremors, joint pain, difficulty in working and living independently.    Observations/Objective:  GENERAL EXAM/CONSTITUTIONAL: Vitals:  Vitals:   04/04/20 1420  BP: 130/78  Pulse: 76  Weight: 229 lb 6.4 oz (104.1 kg)  Height: 5\' 11"  (1.803 m)     Body mass index is 31.99 kg/m. Wt Readings from Last 3 Encounters:  04/04/20 229 lb 6.4 oz (104.1 kg)  07/10/19 228 lb (103.4 kg)  06/11/19 220 lb (99.8 kg)     Patient is in no distress; well developed, nourished and groomed; neck is supple  CARDIOVASCULAR:  Examination of carotid arteries is normal; no carotid bruits  Regular rate and rhythm, no murmurs  Examination of peripheral vascular system by observation and palpation is normal  EYES:  Ophthalmoscopic exam of  optic discs and posterior segments is normal; no papilledema or hemorrhages  No exam data present  MUSCULOSKELETAL:  Gait, strength, tone, movements noted in Neurologic exam below  NEUROLOGIC: MENTAL STATUS:  No flowsheet data found.  awake, alert, oriented to person, place and time  recent and remote memory intact  normal attention and concentration  language fluent, comprehension intact, naming intact  fund of knowledge appropriate  CRANIAL NERVE:   2nd - no papilledema on fundoscopic exam  2nd, 3rd, 4th, 6th - pupils equal and reactive to light, visual fields full to confrontation, extraocular muscles intact, no nystagmus  5th - facial sensation symmetric  7th - facial strength symmetric  8th - hearing intact  9th - palate elevates symmetrically, uvula midline  11th - shoulder shrug symmetric  12th - tongue protrusion midline  MOTOR:   normal bulk and tone, full strength in the BUE, BLE  SENSORY:   normal and symmetric to light touch, temperature, vibration  COORDINATION:   finger-nose-finger, fine finger movements normal  REFLEXES:   deep tendon reflexes present and symmetric  GAIT/STATION:   narrow based gait    Assessment and Plan:  29 y.o. male here with history of seizures since age 55 years old, likely primary generalized epilepsy. Patient has had intermittent grand mal seizures, last occurring in 2016. Now with ongoing intermittent daily spells, decreased attention, decreased processing speed. These were raised as possibly nonepileptic spells.  Last grand mal seizures: Nov 2016, May 2021  Daily staring spells.  Meds tried: lamictal, zarontin, keppra   Dx:  1. 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 lacosamide (VIMPAT) 50 MG TABS tablet    Sig: Take 1 tablet (50 mg total) by mouth 2 (two) times daily.    Dispense:  60 tablet    Refill:  5  . ethosuximide (ZARONTIN) 250 MG capsule    Sig: Take 1 capsule (250 mg total) by mouth 2 (two) times daily.    Dispense:  180 capsule    Refill:  4  . LAMICTAL 200 MG tablet    Sig: Take 2 tablets (400 mg total) by mouth daily AND 3.5 tablets (700 mg total) every evening. BRAND MEDICALLY NECESSARY.    Dispense:  500 tablet    Refill:  4   Follow Up Instructions:  - Return in about 1 year (around 04/04/2021).     Penni Bombard, MD 02/06/4080, 4:48 PM Certified in Neurology, Neurophysiology and Neuroimaging  [facility] [address], [place], [place] [address] [phone]

## 2020-04-05 ENCOUNTER — Encounter: Payer: Self-pay | Admitting: Internal Medicine

## 2020-04-05 ENCOUNTER — Other Ambulatory Visit: Payer: Self-pay

## 2020-04-05 ENCOUNTER — Telehealth: Payer: Self-pay | Admitting: *Deleted

## 2020-04-05 ENCOUNTER — Ambulatory Visit (INDEPENDENT_AMBULATORY_CARE_PROVIDER_SITE_OTHER): Payer: Medicare Other | Admitting: Internal Medicine

## 2020-04-05 VITALS — BP 130/80 | HR 84 | Temp 98.5°F | Ht 71.0 in | Wt 226.0 lb

## 2020-04-05 DIAGNOSIS — R8290 Unspecified abnormal findings in urine: Secondary | ICD-10-CM

## 2020-04-05 DIAGNOSIS — N289 Disorder of kidney and ureter, unspecified: Secondary | ICD-10-CM

## 2020-04-05 DIAGNOSIS — R739 Hyperglycemia, unspecified: Secondary | ICD-10-CM | POA: Insufficient documentation

## 2020-04-05 LAB — BASIC METABOLIC PANEL
BUN: 13 mg/dL (ref 6–23)
CO2: 31 mEq/L (ref 19–32)
Calcium: 9.3 mg/dL (ref 8.4–10.5)
Chloride: 101 mEq/L (ref 96–112)
Creatinine, Ser: 1.16 mg/dL (ref 0.40–1.50)
GFR: 74.2 mL/min (ref >60.00)
Glucose, Bld: 105 mg/dL — ABNORMAL HIGH (ref 70–99)
Potassium: 3.7 mEq/L (ref 3.5–5.1)
Sodium: 137 mEq/L (ref 135–145)

## 2020-04-05 LAB — URINALYSIS, ROUTINE W REFLEX MICROSCOPIC
Bilirubin Urine: NEGATIVE
Hgb urine dipstick: NEGATIVE
Ketones, ur: NEGATIVE
Leukocytes,Ua: NEGATIVE
Nitrite: NEGATIVE
RBC / HPF: NONE SEEN (ref 0–?)
Specific Gravity, Urine: 1.01 (ref 1.000–1.030)
Total Protein, Urine: NEGATIVE
Urine Glucose: NEGATIVE
Urobilinogen, UA: 0.2 (ref 0.0–1.0)
pH: 7.5 (ref 5.0–8.0)

## 2020-04-05 LAB — ETHOSUXIMIDE LEVEL: Ethosuximide Lvl: 24 ug/mL — ABNORMAL LOW (ref 40–100)

## 2020-04-05 LAB — HEMOGLOBIN A1C: Hgb A1c MFr Bld: 5.8 % (ref 4.6–6.5)

## 2020-04-05 LAB — LAMOTRIGINE LEVEL: Lamotrigine Lvl: 8.2 ug/mL (ref 2.0–20.0)

## 2020-04-05 MED ORDER — ETHOSUXIMIDE 250 MG PO CAPS: 500.0000 mg | ORAL_CAPSULE | Freq: Two times a day (BID) | ORAL | 4 refills | Status: DC

## 2020-04-05 NOTE — Telephone Encounter (Signed)
Called patient and informed him his labs showed lamictal level is okay. Ethosuximide (Zarontin) level is low. Dr Leta Baptist sent in new Rx, take 500 mg twice daily of zarontin. Advised he stop Vimpat; he stated he hasn't started taking it yet. He had no questions, repeated directions correctly. I advised he call for any problems, questions. Patient verbalized understanding, appreciation.

## 2020-04-05 NOTE — Telephone Encounter (Signed)
-   ethosuxamide level low; increase to 500mg  twice a day.   - lamictal level ok.  - stop vimpat for now.  Meds ordered this encounter  Medications  . ethosuximide (ZARONTIN) 250 MG capsule    Sig: Take 2 capsules (500 mg total) by mouth 2 (two) times daily.    Dispense:  360 capsule    Refill:  [place], MD 2/95/2841, 3:24 PM Certified in Neurology, Neurophysiology and Neuroimaging  [facility] [address] [place], [place] [address] [phone]

## 2020-04-05 NOTE — Patient Instructions (Addendum)

## 2020-04-05 NOTE — Progress Notes (Signed)
Subjective:    Patient ID: Christopher Reed, male    DOB: 01-27-1991, 29 y.o.   MRN: [id number]  HPI  Here to f/u recent UA with hgb urine dip +, but no RBCs on micro.  Denies urinary symptoms such as dysuria, frequency, urgency, flank pain, hematuria or n/v, fever, chills.  Pt denies chest pain, increased sob or doe, wheezing, orthopnea, PND, increased LE swelling, palpitations, dizziness or syncope.  Denies worsening depressive symptoms, suicidal ideation, or panic; has ongoing anxiety Past Medical History:  Diagnosis Date  . Anxiety   . Seizures ([place])    epilepsy since age 15yrs, sz 03/20/20  . SINUSITIS- ACUTE-NOS 09/25/2010  . TESTICULAR MASS 11/09/2009  . UTI 11/09/2009   Past Surgical History:  Procedure Laterality Date  . ANKLE ARTHROSCOPY Left   . DG THUMB RIGHT HAND ([place] HX)     2-3 yrs ago 2015 or 2016  . Knee surgury  03/2006   Right knee - meniscus tear/minor ACL tear  . LIGAMENT REPAIR Right 05/07/2013   Procedure: cheilectomy, microfracture of metacarpal head surface right thumb;  Surgeon: Cammie Sickle., MD;  Location: [facility];  Service: Orthopedics;  Laterality: Right;  . MYRINGOTOMY WITH TUBE PLACEMENT     as child  . TOE SURGERY Right     reports that he has never smoked. He has never used smokeless tobacco. He reports that he does not drink alcohol and does not use drugs. family history includes Arthritis in an other family member; Diabetes in his father and another family member; Hepatitis C in his paternal grandmother; Hypertension in his father, mother, and another family member; Other in his maternal grandfather; Pneumonia in his paternal grandfather. Allergies  Allergen Reactions  . Bee Venom     angioedema   Current Outpatient Medications on File Prior to Visit  Medication Sig Dispense Refill  . EPINEPHrine (EPIPEN) 0.3 mg/0.3 mL IJ SOAJ injection Inject 0.3 mLs (0.3 mg total) into the muscle once. 2 Device 1  . lacosamide (VIMPAT)  50 MG TABS tablet Take 1 tablet (50 mg total) by mouth 2 (two) times daily. (Patient not taking: Reported on 04/05/2020) 60 tablet 5  . LAMICTAL 200 MG tablet Take 2 tablets (400 mg total) by mouth daily AND 3.5 tablets (700 mg total) every evening. BRAND MEDICALLY NECESSARY. 500 tablet 4   No current facility-administered medications on file prior to visit.   Review of Systems All otherwise neg per pt    Objective:   Physical Exam BP 130/80 (BP Location: Left Arm, Patient Position: Sitting, Cuff Size: Large)   Pulse 84   Temp 98.5 F (36.9 C) (Oral)   Ht 5\' 11"  (1.803 m)   Wt 226 lb (102.5 kg)   SpO2 98%   BMI 31.52 kg/m  VS noted,  Constitutional: Pt appears in NAD HENT: Head: NCAT.  Right Ear: External ear normal.  Left Ear: External ear normal.  Eyes: . Pupils are equal, round, and reactive to light. Conjunctivae and EOM are normal Nose: without d/c or deformity Neck: Neck supple. Gross normal ROM Cardiovascular: Normal rate and regular rhythm.   Pulmonary/Chest: Effort normal and breath sounds without rales or wheezing.  Abd:  Soft, NT, ND, + BS, no organomegaly Neurological: Pt is alert. At baseline orientation, motor grossly intact Skin: Skin is warm. No rashes, other new lesions, no LE edema Psychiatric: Pt behavior is normal without agitation  All otherwise neg per pt Lab Results  Component  Value Date   WBC 7.7 03/20/2020   HGB 16.5 03/20/2020   HCT 49.5 03/20/2020   PLT 311 03/20/2020   GLUCOSE 105 (H) 04/05/2020   CHOL 156 07/06/2019   TRIG 110.0 07/06/2019   HDL 32.90 (L) 07/06/2019   LDLCALC 101 (H) 07/06/2019   ALT 29 03/20/2020   AST 24 03/20/2020   NA 137 04/05/2020   K 3.7 04/05/2020   CL 101 04/05/2020   CREATININE 1.16 04/05/2020   BUN 13 04/05/2020   CO2 31 04/05/2020   TSH 0.99 07/06/2019   HGBA1C 5.8 04/05/2020      Assessment & Plan:

## 2020-04-06 ENCOUNTER — Encounter: Payer: Self-pay | Admitting: Internal Medicine

## 2020-04-06 DIAGNOSIS — R8290 Unspecified abnormal findings in urine: Secondary | ICD-10-CM | POA: Insufficient documentation

## 2020-04-06 NOTE — Assessment & Plan Note (Signed)
Mild on recent bmp, suspect mild low volume, for f/u today

## 2020-04-06 NOTE — Assessment & Plan Note (Addendum)
Asympt, likely hgb urine dip is false + for myoglobin as rbc are neg on micro, very low suspicion for significant gu abnormality, for repeat UA, o/w  to f/u any worsening symptoms or concerns  I spent 31 minutes in preparing to see the patient by review of recent labs, imaging and procedures, obtaining and reviewing separately obtained history, communicating with the patient and family or caregiver, ordering medications, tests or procedures, and documenting clinical information in the EHR including the differential Dx, treatment, and any further evaluation and other management of abnormal ua, hyperglycemia, renal insufficiency

## 2020-04-06 NOTE — Assessment & Plan Note (Signed)
stable overall by history and exam, recent data reviewed with pt, and pt to continue medical treatment as before,  to f/u any worsening symptoms or concerns  

## 2020-06-16 ENCOUNTER — Ambulatory Visit (INDEPENDENT_AMBULATORY_CARE_PROVIDER_SITE_OTHER): Payer: Medicare Other | Admitting: Sports Medicine

## 2020-06-16 ENCOUNTER — Other Ambulatory Visit: Payer: Self-pay

## 2020-06-16 DIAGNOSIS — M67449 Ganglion, unspecified hand: Secondary | ICD-10-CM

## 2020-06-16 NOTE — Progress Notes (Signed)
PCP: Biagio Borg, MD  Subjective:   CC: Patient is a 29 y.o. male here for middle finger swelling.  HPI: 1 week of middle finger abnormality. Denies injury. Incidental finding when touching his hand. Using it normally. Doesn't hurt when moving. No redness.  Hasn't tried anything to help pain Just hurts when it bumps against something or picks something up with the finger.  No weakness, numbness, tingling.      Objective:  BP 120/82   Ht 5\' 11"  (1.803 m)   Wt 220 lb (99.8 kg)   BMI 30.68 kg/m   Physical Exam: Gen: NAD, comfortable in exam room L 3rd digit Observation: negative erythema, edema, protrusion Palpation: firm and distinguishable mass on palmar surface of proximal middle phalanx. Tender to palpation ROM: full.  Strength: intact, No pain with resisted flexion or extension Special tests: none  [place]: exam positive for hypoechoic and well-defined lesion communicating with flexor digitorum on palmer third digit on proximal phalanx.    Assessment & Plan:  Ganglion cyst of finger Seen on exam and confirmed with [place].  - conservative treatment with watching for 2-3 weeks for spontaneous improvement/resolution - return in that time frame to repeat scan. - will consider referral to hand surgeon if the cyst irritation is not tolerable   Doristine Mango, [place] PGY-3  Patient seen and evaluated with the resident.  I agree with the above plan of care.  Ultrasound today shows a ganglion cyst off of the flexor tendon at the proximal phalanx of the third digit.  Treatment as above and then follow-up in 2 to 3 weeks for repeat ultrasound.  If the cyst grows in size or becomes more symptomatic then consider referral to hand surgery for consideration of cyst excision.

## 2020-06-16 NOTE — Assessment & Plan Note (Signed)
Seen on exam and confirmed with [place].  - conservative treatment with watching for 2-3 weeks for spontaneous improvement/resolution - return in that time frame to repeat scan. - will consider referral to hand surgeon if the cyst irritation is not tolerable

## 2020-06-30 ENCOUNTER — Other Ambulatory Visit: Payer: Self-pay

## 2020-06-30 ENCOUNTER — Ambulatory Visit (INDEPENDENT_AMBULATORY_CARE_PROVIDER_SITE_OTHER): Payer: Medicare Other | Admitting: Sports Medicine

## 2020-06-30 ENCOUNTER — Encounter: Payer: Self-pay | Admitting: Sports Medicine

## 2020-06-30 VITALS — BP 123/74 | Ht 71.0 in | Wt 220.0 lb

## 2020-06-30 DIAGNOSIS — M67449 Ganglion, unspecified hand: Secondary | ICD-10-CM

## 2020-06-30 NOTE — Patient Instructions (Addendum)
Dr Charlotte Crumb Tuesday 07/05/20 at [facility] time is [address], [place], [place] [address] Phone: [phone]

## 2020-06-30 NOTE — Progress Notes (Signed)
   Subjective:    Patient ID: Christopher Reed, male    DOB: 11-26-1990, 29 y.o.   MRN: [id number]  HPI   Patient comes in for follow up on a ganglion cyst in the third finger of his left hand. Cyst has not changed in size. It is painful with direct palpation. He would like to discuss surgical excision with one of the hand specialists.   Review of Systems    As above Objective:   Physical Exam  Well developed, well nourished. No acute distress  Examination of the left hand with attention to the left third digit still shows a palpable nodule on the palmar aspect of the third proximal phalanx. It is tender to palpation. No active triggering of the finger. Brisk capillary refill distally.  Limited [facility] ultrasound shows persistent ganglion cyst off of the flexor tendon, unchanged in size when compared to previous ultrasound.      Assessment & Plan:   Tender ganglion cyst, third digit of the left hand  Referral to Dr. Burney Gauze to discuss treatment options. Follow-up with me as needed.

## 2020-07-06 ENCOUNTER — Ambulatory Visit: Payer: Medicare Other | Admitting: Podiatry

## 2020-07-08 ENCOUNTER — Other Ambulatory Visit: Payer: Self-pay

## 2020-07-08 ENCOUNTER — Ambulatory Visit (INDEPENDENT_AMBULATORY_CARE_PROVIDER_SITE_OTHER): Payer: Medicare Other

## 2020-07-08 DIAGNOSIS — Z23 Encounter for immunization: Secondary | ICD-10-CM

## 2020-07-12 ENCOUNTER — Encounter: Payer: Medicare Other | Admitting: Internal Medicine

## 2020-07-12 DIAGNOSIS — M7989 Other specified soft tissue disorders: Secondary | ICD-10-CM | POA: Diagnosis not present

## 2020-07-15 ENCOUNTER — Ambulatory Visit: Payer: Medicare Other | Admitting: Podiatry

## 2020-07-21 ENCOUNTER — Ambulatory Visit: Payer: Medicare Other | Admitting: Podiatry

## 2020-07-27 ENCOUNTER — Encounter: Payer: Self-pay | Admitting: Podiatry

## 2020-07-27 ENCOUNTER — Other Ambulatory Visit: Payer: Self-pay

## 2020-07-27 ENCOUNTER — Ambulatory Visit (INDEPENDENT_AMBULATORY_CARE_PROVIDER_SITE_OTHER): Payer: Medicare Other | Admitting: Podiatry

## 2020-07-27 DIAGNOSIS — M722 Plantar fascial fibromatosis: Secondary | ICD-10-CM | POA: Diagnosis not present

## 2020-07-27 NOTE — Progress Notes (Signed)
Subjective:   Patient ID: Christopher Reed, male   DOB: 29 y.o.   MRN: [id number]   HPI Patient presents with chronic plantar fasciitis of both feet which only recently has become symptomatic   ROS      Objective:  Physical Exam  Neurovascular status intact with exquisite discomfort plantar fascial bilateral insertional point tendon calcaneus     Assessment:  Acute reoccurrence plantar fasciitis bilateral     Plan:  Sterile prep done injected the plantar fascial bilateral 3 mg Kenalog 5 mg Xylocaine advised on shoe gear modifications and reappoint as needed

## 2020-08-09 ENCOUNTER — Ambulatory Visit (INDEPENDENT_AMBULATORY_CARE_PROVIDER_SITE_OTHER): Payer: Medicare Other | Admitting: Internal Medicine

## 2020-08-09 ENCOUNTER — Encounter: Payer: Self-pay | Admitting: Internal Medicine

## 2020-08-09 ENCOUNTER — Other Ambulatory Visit: Payer: Self-pay

## 2020-08-09 VITALS — BP 120/70 | HR 79 | Temp 98.3°F | Ht 71.0 in | Wt 220.0 lb

## 2020-08-09 DIAGNOSIS — Z114 Encounter for screening for human immunodeficiency virus [HIV]: Secondary | ICD-10-CM

## 2020-08-09 DIAGNOSIS — E538 Deficiency of other specified B group vitamins: Secondary | ICD-10-CM

## 2020-08-09 DIAGNOSIS — Z0000 Encounter for general adult medical examination without abnormal findings: Secondary | ICD-10-CM

## 2020-08-09 DIAGNOSIS — E559 Vitamin D deficiency, unspecified: Secondary | ICD-10-CM | POA: Diagnosis not present

## 2020-08-09 LAB — BASIC METABOLIC PANEL
BUN: 16 mg/dL (ref 6–23)
CO2: 31 mEq/L (ref 19–32)
Calcium: 9.3 mg/dL (ref 8.4–10.5)
Chloride: 101 mEq/L (ref 96–112)
Creatinine, Ser: 1.32 mg/dL (ref 0.40–1.50)
GFR: 71.98 mL/min (ref >60.00)
Glucose, Bld: 85 mg/dL (ref 70–99)
Potassium: 3.8 mEq/L (ref 3.5–5.1)
Sodium: 138 mEq/L (ref 135–145)

## 2020-08-09 LAB — LIPID PANEL
Cholesterol: 153 mg/dL (ref 0–200)
HDL: 33.4 mg/dL — ABNORMAL LOW (ref >39.00)
LDL Cholesterol: 85 mg/dL (ref 0–99)
NonHDL: 119.88
Total CHOL/HDL Ratio: 5
Triglycerides: 173 mg/dL — ABNORMAL HIGH (ref 0.0–149.0)
VLDL: 34.6 mg/dL (ref 0.0–40.0)

## 2020-08-09 LAB — TSH: TSH: 0.82 u[IU]/mL (ref 0.35–4.50)

## 2020-08-09 LAB — HEPATIC FUNCTION PANEL
ALT: 24 U/L (ref 0–53)
AST: 15 U/L (ref 0–37)
Albumin: 4.7 g/dL (ref 3.5–5.2)
Alkaline Phosphatase: 74 U/L (ref 39–117)
Bilirubin, Direct: 0.1 mg/dL (ref 0.0–0.3)
Total Bilirubin: 0.5 mg/dL (ref 0.2–1.2)
Total Protein: 7.3 g/dL (ref 6.0–8.3)

## 2020-08-09 LAB — CBC WITH DIFFERENTIAL/PLATELET
Basophils Absolute: 0.1 10*3/uL (ref 0.0–0.1)
Basophils Relative: 0.9 % (ref 0.0–3.0)
Eosinophils Absolute: 0.1 10*3/uL (ref 0.0–0.7)
Eosinophils Relative: 2.3 % (ref 0.0–5.0)
HCT: 46 % (ref 39.0–52.0)
Hemoglobin: 15.7 g/dL (ref 13.0–17.0)
Lymphocytes Relative: 24.4 % (ref 12.0–46.0)
Lymphs Abs: 1.4 10*3/uL (ref 0.7–4.0)
MCHC: 34.1 g/dL (ref 30.0–36.0)
MCV: 94.6 fl (ref 78.0–100.0)
Monocytes Absolute: 0.5 10*3/uL (ref 0.1–1.0)
Monocytes Relative: 9.3 % (ref 3.0–12.0)
Neutro Abs: 3.6 10*3/uL (ref 1.4–7.7)
Neutrophils Relative %: 63.1 % (ref 43.0–77.0)
Platelets: 268 10*3/uL (ref 150.0–400.0)
RBC: 4.86 Mil/uL (ref 4.22–5.81)
RDW: 13.1 % (ref 11.5–15.5)
WBC: 5.7 10*3/uL (ref 4.0–10.5)

## 2020-08-09 LAB — VITAMIN D 25 HYDROXY (VIT D DEFICIENCY, FRACTURES): VITD: 24.95 ng/mL — ABNORMAL LOW (ref 30.00–100.00)

## 2020-08-09 LAB — VITAMIN B12: Vitamin B-12: 542 pg/mL (ref 211–911)

## 2020-08-09 NOTE — Patient Instructions (Signed)
Please remember to have the moderna booster shot when it is available soon  Please continue all other medications as before, and refills have been done if requested.  Please have the pharmacy call with any other refills you may need.  Please continue your efforts at being more active, low cholesterol diet, and weight control.  You are otherwise up to date with prevention measures today.  Please keep your appointments with your specialists as you may have planned  Please go to the LAB at the blood drawing area for the tests to be done  You will be contacted by phone if any changes need to be made immediately.  Otherwise, you will receive a letter about your results with an explanation, but please check with MyChart first.  Please remember to sign up for MyChart if you have not done so, as this will be important to you in the future with finding out test results, communicating by private email, and scheduling acute appointments online when needed. ' Please make an Appointment to return for your 1 year visit, or sooner if needed

## 2020-08-09 NOTE — Progress Notes (Signed)
Subjective:    Patient ID: Christopher Reed, male    DOB: 10-Apr-1991, 29 y.o.   MRN: [id number]  HPI  Here for wellness and f/u;  Overall doing ok;  Pt denies Chest pain, worsening SOB, DOE, wheezing, orthopnea, PND, worsening LE edema, palpitations, dizziness or syncope.  Pt denies neurological change such as new headache, facial or extremity weakness.  Pt denies polydipsia, polyuria, or low sugar symptoms. Pt states overall good compliance with treatment and medications, good tolerability, and has been trying to follow appropriate diet.  Pt denies worsening depressive symptoms, suicidal ideation or panic. No fever, night sweats, wt loss, loss of appetite, or other constitutional symptoms.  Pt states good ability with ADL's, has low fall risk, home safety reviewed and adequate, no other significant changes in hearing or vision, and only occasionally active with exercise.  No new complaints Past Medical History:  Diagnosis Date  . Anxiety   . Seizures ([place])    epilepsy since age 89yrs, sz 03/20/20  . SINUSITIS- ACUTE-NOS 09/25/2010  . TESTICULAR MASS 11/09/2009  . UTI 11/09/2009   Past Surgical History:  Procedure Laterality Date  . ANKLE ARTHROSCOPY Left   . DG THUMB RIGHT HAND ([place] HX)     2-3 yrs ago 2015 or 2016  . Knee surgury  03/2006   Right knee - meniscus tear/minor ACL tear  . LIGAMENT REPAIR Right 05/07/2013   Procedure: cheilectomy, microfracture of metacarpal head surface right thumb;  Surgeon: Cammie Sickle., MD;  Location: [facility];  Service: Orthopedics;  Laterality: Right;  . MYRINGOTOMY WITH TUBE PLACEMENT     as child  . TOE SURGERY Right     reports that he has never smoked. He has never used smokeless tobacco. He reports that he does not drink alcohol and does not use drugs. family history includes Arthritis in an other family member; Diabetes in his father and another family member; Hepatitis C in his paternal grandmother; Hypertension in his  father, mother, and another family member; Other in his maternal grandfather; Pneumonia in his paternal grandfather. Allergies  Allergen Reactions  . Bee Venom     angioedema   Current Outpatient Medications on File Prior to Visit  Medication Sig Dispense Refill  . EPINEPHrine (EPIPEN) 0.3 mg/0.3 mL IJ SOAJ injection Inject 0.3 mLs (0.3 mg total) into the muscle once. 2 Device 1  . ethosuximide (ZARONTIN) 250 MG capsule Take 2 capsules (500 mg total) by mouth 2 (two) times daily. 360 capsule 4  . LAMICTAL 200 MG tablet Take 2 tablets (400 mg total) by mouth daily AND 3.5 tablets (700 mg total) every evening. BRAND MEDICALLY NECESSARY. 500 tablet 4   No current facility-administered medications on file prior to visit.   Review of Systems All otherwise neg per pt     Objective:   Physical Exam BP 120/70 (BP Location: Left Arm, Patient Position: Sitting, Cuff Size: Large)   Pulse 79   Temp 98.3 F (36.8 C) (Oral)   Ht 5\' 11"  (1.803 m)   Wt 220 lb (99.8 kg)   SpO2 98%   BMI 30.68 kg/m   VS noted,  Constitutional: Pt appears in NAD HENT: Head: NCAT.  Right Ear: External ear normal.  Left Ear: External ear normal.  Eyes: . Pupils are equal, round, and reactive to light. Conjunctivae and EOM are normal Nose: without d/c or deformity Neck: Neck supple. Gross normal ROM Cardiovascular: Normal rate and regular rhythm.   Pulmonary/Chest:  Effort normal and breath sounds without rales or wheezing.  Abd:  Soft, NT, ND, + BS, no organomegaly Neurological: Pt is alert. At baseline orientation, motor grossly intact Skin: Skin is warm. No rashes, other new lesions, no LE edema Psychiatric: Pt behavior is normal without agitation  All otherwise neg per pt Lab Results  Component Value Date   WBC 5.7 08/09/2020   HGB 15.7 08/09/2020   HCT 46.0 08/09/2020   PLT 268.0 08/09/2020   GLUCOSE 85 08/09/2020   CHOL 153 08/09/2020   TRIG 173.0 (H) 08/09/2020   HDL 33.40 (L) 08/09/2020    LDLCALC 85 08/09/2020   ALT 24 08/09/2020   AST 15 08/09/2020   NA 138 08/09/2020   K 3.8 08/09/2020   CL 101 08/09/2020   CREATININE 1.32 08/09/2020   BUN 16 08/09/2020   CO2 31 08/09/2020   TSH 0.82 08/09/2020   HGBA1C 5.8 04/05/2020      Assessment & Plan:

## 2020-08-10 LAB — URINALYSIS, ROUTINE W REFLEX MICROSCOPIC
Bilirubin Urine: NEGATIVE
Hgb urine dipstick: NEGATIVE
Ketones, ur: NEGATIVE
Leukocytes,Ua: NEGATIVE
Nitrite: NEGATIVE
RBC / HPF: NONE SEEN (ref 0–?)
Specific Gravity, Urine: 1.02 (ref 1.000–1.030)
Total Protein, Urine: NEGATIVE
Urine Glucose: NEGATIVE
Urobilinogen, UA: 0.2 (ref 0.0–1.0)
WBC, UA: NONE SEEN (ref 0–?)
pH: 7 (ref 5.0–8.0)

## 2020-08-10 LAB — HIV ANTIBODY (ROUTINE TESTING W REFLEX): HIV 1&2 Ab, 4th Generation: NONREACTIVE

## 2020-08-11 DIAGNOSIS — M7989 Other specified soft tissue disorders: Secondary | ICD-10-CM | POA: Diagnosis not present

## 2020-08-12 DIAGNOSIS — M71342 Other bursal cyst, left hand: Secondary | ICD-10-CM | POA: Diagnosis not present

## 2020-08-14 ENCOUNTER — Encounter: Payer: Self-pay | Admitting: Internal Medicine

## 2020-08-14 NOTE — Assessment & Plan Note (Signed)

## 2020-08-25 ENCOUNTER — Encounter: Payer: Self-pay | Admitting: Family Medicine

## 2020-08-25 ENCOUNTER — Telehealth (INDEPENDENT_AMBULATORY_CARE_PROVIDER_SITE_OTHER): Payer: Medicare Other | Admitting: Family Medicine

## 2020-08-25 DIAGNOSIS — R0981 Nasal congestion: Secondary | ICD-10-CM | POA: Diagnosis not present

## 2020-08-25 DIAGNOSIS — R059 Cough, unspecified: Secondary | ICD-10-CM

## 2020-08-25 MED ORDER — BENZONATATE 100 MG PO CAPS
100.0000 mg | ORAL_CAPSULE | Freq: Three times a day (TID) | ORAL | 0 refills | Status: DC | PRN
Start: 2020-08-25 — End: 2021-05-31

## 2020-08-25 NOTE — Patient Instructions (Addendum)
   -  stay home while sick, and if you have [place] please stay home for a full 10 days since the onset of symptoms PLUS one day of no fever and feeling better  -[place] COVID19 testing information: https://www.[facility].org/ OR [phone] Most pharmacies also offer COVID-19 testing. Also, some pharmacies offer over-the-counter home testing kits.  -I sent the medication(s) we discussed to your pharmacy: Meds ordered this encounter  Medications  . benzonatate (TESSALON PERLES) 100 MG capsule    Sig: Take 1 capsule (100 mg total) by mouth 3 (three) times daily as needed.    Dispense:  20 capsule    Refill:  0    -can use nasal saline a few times per day if nasal congestion  -sometime a short 3-day course of Afrin nasal spray for 3 days can help as well  -stay hydrated, drink plenty of fluids and eat small healthy meals - avoid dairy  -follow up with your doctor in 2-3 days unless improving and feeling better    I hope you are feeling better soon!  Seek in person care promptly if your symptoms worsen, new concerns arise or you are not improving with treatment.  It was nice to meet you today. I help [facility] out with telemedicine visits on Tuesdays and Thursdays and am available for visits on those days. If you have any concerns or questions following this visit please schedule a follow up visit with your Primary Care doctor or seek care at a local urgent care clinic to avoid delays in care.

## 2020-08-25 NOTE — Progress Notes (Signed)
Virtual Visit via Video Note  I connected with Juanda Crumble  on 08/25/20 at 11:40 AM EDT by a video enabled telemedicine application and verified that I am speaking with the correct person using two identifiers.  Location patient: home, [place] Location provider:work or home office Persons participating in the virtual visit: patient, provider  I discussed the limitations of evaluation and management by telemedicine and the availability of in person appointments. The patient expressed understanding and agreed to proceed.   HPI:  Acute telemedicine visit for Cough: -Onset: about 1 week ago -Symptoms include: productive cough, nasal congestion, postnasal drip -Denies: fevers, SOB, wheezing, NVD, body aches, malaise, sinus discomfort or pain -his father has similar symptoms -Has tried: musinex-DM -Pertinent past medical history: -Pertinent medication allergies: -COVID-19 vaccine status: vaccinated for COVID and flu  ROS: See pertinent positives and negatives per HPI.  Past Medical History:  Diagnosis Date  . Anxiety   . Seizures ([place])    epilepsy since age 89yrs, sz 03/20/20  . SINUSITIS- ACUTE-NOS 09/25/2010  . TESTICULAR MASS 11/09/2009  . UTI 11/09/2009    Past Surgical History:  Procedure Laterality Date  . ANKLE ARTHROSCOPY Left   . DG THUMB RIGHT HAND ([place] [facility] HX)     2-3 yrs ago 2015 or 2016  . Knee surgury  03/2006   Right knee - meniscus tear/minor ACL tear  . LIGAMENT REPAIR Right 05/07/2013   Procedure: cheilectomy, microfracture of metacarpal head surface right thumb;  Surgeon: Cammie Sickle., MD;  Location: [facility];  Service: Orthopedics;  Laterality: Right;  . MYRINGOTOMY WITH TUBE PLACEMENT     as child  . TOE SURGERY Right      Current Outpatient Medications:  .  EPINEPHrine (EPIPEN) 0.3 mg/0.3 mL IJ SOAJ injection, Inject 0.3 mLs (0.3 mg total) into the muscle once., Disp: 2 Device, Rfl: 1 .  ethosuximide (ZARONTIN) 250 MG capsule, Take 2 capsules  (500 mg total) by mouth 2 (two) times daily., Disp: 360 capsule, Rfl: 4 .  LAMICTAL 200 MG tablet, Take 2 tablets (400 mg total) by mouth daily AND 3.5 tablets (700 mg total) every evening. BRAND MEDICALLY NECESSARY., Disp: 500 tablet, Rfl: 4 .  benzonatate (TESSALON PERLES) 100 MG capsule, Take 1 capsule (100 mg total) by mouth 3 (three) times daily as needed., Disp: 20 capsule, Rfl: 0  EXAM:  VITALS per patient if applicable:  GENERAL: alert, oriented, appears well and in no acute distress  HEENT: atraumatic, conjunttiva clear, no obvious abnormalities on inspection of external nose and ears  NECK: normal movements of the head and neck  LUNGS: on inspection no signs of respiratory distress, breathing rate appears normal, no obvious gross SOB, gasping or wheezing  CV: no obvious cyanosis  MS: moves all visible extremities without noticeable abnormality  PSYCH/NEURO: pleasant and cooperative, no obvious depression or anxiety, speech and thought processing grossly intact  ASSESSMENT AND PLAN:  Discussed the following assessment and plan:  Nasal congestion  Cough  -we discussed possible serious and likely etiologies, options for evaluation and workup, limitations of telemedicine visit vs in person visit, treatment, treatment risks and precautions. Pt prefers to treat via telemedicine empirically rather than in person at this moment.  He denies any issues with seasonal allergies.  This is most likely a viral upper respiratory illness versus other.  Discussed potential for Covid and provided options for testing.  Opted for symptomatic care with nasal saline, a short 3-day course of nasal decongestant and a Tessalon  Rx was sent for the cough.  Advised prompt follow-up if worsening or symptoms or not improving over the next several days. Work/School slipped offered: declined  Advised to seek prompt in person care if worsening, new symptoms arise, or if is not improving with treatment.  Discussed options for inperson care if PCP office not available. Did let this patient know in patient instructions that I only do telemedicine on Tuesdays and Thursdays for [place]. Advised to schedule follow up visit with PCP or [facility] if any further questions or concerns to avoid delays in care.   I discussed the assessment and treatment plan with the patient. The patient was provided an opportunity to ask questions and all were answered. The patient agreed with the plan and demonstrated an understanding of the instructions.     Lucretia Kern, DO

## 2020-10-06 ENCOUNTER — Telehealth: Payer: Self-pay | Admitting: *Deleted

## 2020-10-06 ENCOUNTER — Other Ambulatory Visit: Payer: Self-pay | Admitting: Diagnostic Neuroimaging

## 2020-10-06 NOTE — Telephone Encounter (Signed)
Called patient to discuss [facility] request to refill Vimpat. Per Dr Leta Baptist it was discontinued on 04/05/20; apparently an automatic refill request from pharmacy. He is not taking Vimpat, only Lamictal and Zarontin. I advised will refuse refill request. Patient verbalized understanding, appreciation.

## 2020-12-15 ENCOUNTER — Ambulatory Visit (INDEPENDENT_AMBULATORY_CARE_PROVIDER_SITE_OTHER): Payer: Medicare Other | Admitting: Podiatry

## 2020-12-15 ENCOUNTER — Ambulatory Visit (INDEPENDENT_AMBULATORY_CARE_PROVIDER_SITE_OTHER): Payer: Medicare Other

## 2020-12-15 ENCOUNTER — Other Ambulatory Visit: Payer: Self-pay

## 2020-12-15 DIAGNOSIS — M722 Plantar fascial fibromatosis: Secondary | ICD-10-CM

## 2020-12-15 DIAGNOSIS — L03119 Cellulitis of unspecified part of limb: Secondary | ICD-10-CM

## 2020-12-15 DIAGNOSIS — L02619 Cutaneous abscess of unspecified foot: Secondary | ICD-10-CM

## 2020-12-15 MED ORDER — DOXYCYCLINE HYCLATE 100 MG PO TABS: 100.0000 mg | ORAL_TABLET | Freq: Two times a day (BID) | ORAL | 0 refills | Status: DC

## 2020-12-18 NOTE — Progress Notes (Signed)
Subjective:   Patient ID: Christopher Reed, male   DOB: 30 y.o.   MRN: [id number]   HPI Patient presents stating he has had a lot of redness on the top of his foot and appeared to have an ingrown hair that his mother worked on but they are not sure and it still red and the foot is sore   ROS      Objective:  Physical Exam  Neurovascular status intact with patient found to have inflammation of the midfoot left with a small area that may have an ingrown hair or other foreign body or possible localized abscess     Assessment:  Possibility for abscess or foreign body or other pathology left forefoot     Plan:  H&P reviewed condition. Today I went ahead I took an x-ray as precautionary measure and I did a forefoot block of the foot. Then using sterile sharp instrumentation I opened the area up I flushed it I was unable to find any active drainage to culture but there is redness around it and I am placing him on doxycycline. I did discuss if it were to stay red or if you were develop any signs systemic infection he needs to go to the hospital and required IV antibiotics and the possibility for further opening of the area. Hopefully will heal uneventfully for what was done today with the localized treatment of the abscess  X-rays were negative for signs of bony changes or other pathology from that standpoint

## 2021-01-27 ENCOUNTER — Telehealth: Payer: Self-pay | Admitting: Diagnostic Neuroimaging

## 2021-01-27 NOTE — Telephone Encounter (Signed)
Pt states that the pharmacy needs the updated Rx for his ethosuximide (ZARONTIN) 250 MG capsule He states that they are giving him the medication with the old dosage. Please advise.

## 2021-01-30 ENCOUNTER — Encounter: Payer: Self-pay | Admitting: *Deleted

## 2021-01-31 NOTE — Telephone Encounter (Signed)
Called patient and advised him of refill sent June 2021 x 1 year. He stated [facility] to [facility] and was told they were refilling his old Rx but have new Rx on file. He will be able to get refills as prescribed,  verbalized understanding, appreciation.

## 2021-03-28 ENCOUNTER — Ambulatory Visit (INDEPENDENT_AMBULATORY_CARE_PROVIDER_SITE_OTHER): Payer: Medicare Other | Admitting: Sports Medicine

## 2021-03-28 DIAGNOSIS — M7989 Other specified soft tissue disorders: Secondary | ICD-10-CM | POA: Insufficient documentation

## 2021-03-28 NOTE — Patient Instructions (Signed)
It was great to meet you today! Thank you for letting me participate in your care!  Today, we discussed your finger and hand swelling that has improved on its own. I am glad it is getting better and now resolved. Your exam was very reassuring today that nothing bad has occurred and you should have continued full function of your finger and hand without pain. I do not believe this is a return of your cyst. If the swelling reoccurs or gets worse or you start having pain or difficulty using your finger/hand please let us know.  Be well, Harolyn Rutherford, DO PGY-4, Sports Medicine Fellow [place]

## 2021-03-28 NOTE — Assessment & Plan Note (Signed)
Patient with one week of intermittent hand swelling with associated itching that is now resolved. Did have a fall onto his elbows that could account for upper extremity swelling that would go to the hand. No pain, no loss of function, no rash, and now no swelling. This does not appear to be a return of the cyst which was patient's primary concern. - F/u as needed

## 2021-03-28 NOTE — Progress Notes (Signed)
    SUBJECTIVE:   CHIEF COMPLAINT / HPI:   Left Middle Finger Swelling Mr. Rufus is a very pleasant 30y/o male who presents today for 1 week of hand and finger swelling after a fall. He does not have pain and today is swelling is resolved. He noticed it when he would wake up in the morning and it would go away with use. At first in the morning he would have difficulty making a fist for a few days but this would resolve quickly with normal use. He did have some itchiness as well over the palmar aspect of the hand. No rash, no warmth, no bruising that he can remember. He denies numbness, tingling, or burning. He was concerned his ganglion cyst is returning.  PERTINENT  PMH / PSH: Hx of Epilepsy, anxiety, essential tremor, ganglion cyst on finger  OBJECTIVE:   BP 132/84   Ht 5\' 11"  (1.803 m)   Wt 225 lb (102.1 kg)   BMI 31.38 kg/m   [facility] 06/16/2020 06/30/2020  Frequency of aerobic exercise (# of days/week) 2 2  Average time in minutes 20 30  Frequency of strengthening activities (# of days/week) 0 1   MSK: Examination of the left hand signifcant for no obvious deformity, no swelling, no ecchymosis, no erythema, no warmth, no tenderness to palpation. He can make a fist with ease and no pain. Full ROM of the hand and middle finger specifically. He has good capillary refill on all fingers of the left hand. +2 distal radius pulse. Grip strength, finger abduction, and pincer strength is intact, equal and symmetric bilaterally. Gross sensation intact. Bilateral elbow motion full and intact with flexion, extension, pronation, and supination. TTP over area of ecchymosis on medial portion of elbow. Strength and sensation intact.  ASSESSMENT/PLAN:   Hand swelling Patient with one week of intermittent hand swelling with associated itching that is now resolved. Did have a fall onto his elbows that could account for upper extremity swelling that would go to the hand. No  pain, no loss of function, no rash, and now no swelling. This does not appear to be a return of the cyst which was patient's primary concern. - F/u as needed     Nuala Alpha, DO PGY-4, Sports Medicine Fellow [place]  Patient seen and evaluated with the sports medicine fellow.  I agree with the above plan of care.  Patient is reassured that there is no evidence of a returning ganglion cyst in his finger.  The swelling he was experiencing was secondary to his elbow contusion.  Swelling has now resolved.  He still has some ecchymosis along the right medial elbow but he has full painless range of motion and no obvious effusion.  Fracture is unlikely.  His pain has improved by about 50% since his injury a week ago.  I recommend a watchful waiting approach at this time but the patient understands that if his elbow pain does not resolve then we will need to consider x-ray.  Otherwise, I think he is safe to resume activity as tolerated and can follow-up for ongoing or recalcitrant issues.

## 2021-04-04 ENCOUNTER — Ambulatory Visit: Payer: Medicare Other | Admitting: Diagnostic Neuroimaging

## 2021-04-11 ENCOUNTER — Ambulatory Visit: Payer: Medicare Other | Admitting: Diagnostic Neuroimaging

## 2021-04-21 ENCOUNTER — Other Ambulatory Visit: Payer: Self-pay | Admitting: Diagnostic Neuroimaging

## 2021-05-31 ENCOUNTER — Encounter: Payer: Self-pay | Admitting: Diagnostic Neuroimaging

## 2021-05-31 ENCOUNTER — Ambulatory Visit (INDEPENDENT_AMBULATORY_CARE_PROVIDER_SITE_OTHER): Payer: Medicare Other | Admitting: Diagnostic Neuroimaging

## 2021-05-31 ENCOUNTER — Other Ambulatory Visit: Payer: Self-pay

## 2021-05-31 VITALS — BP 132/76 | HR 83 | Ht 71.0 in | Wt 234.0 lb

## 2021-05-31 DIAGNOSIS — R4689 Other symptoms and signs involving appearance and behavior: Secondary | ICD-10-CM | POA: Diagnosis not present

## 2021-05-31 DIAGNOSIS — G40309 Generalized idiopathic epilepsy and epileptic syndromes, not intractable, without status epilepticus: Secondary | ICD-10-CM

## 2021-05-31 MED ORDER — LAMICTAL 200 MG PO TABS: ORAL_TABLET | ORAL | 4 refills | Status: DC

## 2021-05-31 MED ORDER — ETHOSUXIMIDE 250 MG PO CAPS: 500.0000 mg | ORAL_CAPSULE | Freq: Two times a day (BID) | ORAL | 4 refills | Status: DC

## 2021-05-31 NOTE — Patient Instructions (Signed)
-  continue current meds  

## 2021-05-31 NOTE — Progress Notes (Signed)
Chief Complaint  Patient presents with   Epilepsy    Rm 6 one year FU "doing well, no seizures"     History of Present Illness:  UPDATE (05/31/21, VRP): Since last visit, doing well. Symptoms are stable. No alleviating or aggravating factors. Tolerating meds. No major seizures. Very rare staring spells.  UPDATE (04/04/20, VRP): Since last visit, doing well until 03/20/2020 1 patient had unprovoked seizure.  Patient was at sister's house, in the car when all of a sudden he made a clicking sound.  Then he had stiffness and generalized convulsions.  Afterwards he was very confused and combative.  Paramedics were called patient went to the emergency room for evaluation.  CT head unremarkable.  Labs unremarkable except for hematuria and elevated creatinine.  Since then patient has returned to baseline.  No further events.  He continues to have a few daily staring spells like in the past.  He is tolerating his medications.  There has been concern about possible chronic dehydration.   UPDATE (02/16/19, VRP):  - continues with daily staring spells (3-4 days per weeks; 5-7 spells per day) - had amb EEG at [facility]; some discharges noted; no clinical events; no subclincal seizures - overall stable - not driving currently; does not plan to drive in near future  UPDATE (05/26/18, VRP): Since last visit, doing about the same. Symptoms are stable. Severity is moderate. No alleviating or aggravating factors. Tolerating anti-seizure meds. Avg ~10 staring spells per day. Not clear if these are daydreaming or petit mal sz.    PRIOR HPI (07/08/17): 30 year old right-handed male here for evaluation of seizure disorder.   Patient had normal birth and development. At age 2 years old he had intermittent episodes of seizures. Patient would have staring spells as well as grand mal seizures. He would have intermittent muscle jerks. Over the years he was treated with Lamictal and this dose was increased over time. Last grand  mal seizure was run 2014.   He was then developing continued problems with intermittent daily staring spells. Therefore he was referred to [facility] video EEG for evaluation in January 2015. During the admission his Lamictal was decreased from 600 mg twice a day down to 300 mg twice a day. Patient had several stereotypical events which were confirmed to be correlated with electrographic seizure activity. He was discharged on Lamictal 400 mg twice a day with ethosuxamide '250mg'$  twice a day. Patient was tried on Keppra but they could not tolerate side effects. Modified Atkins diet was considered. VNS was considered. In 2016 Dr. Ginny Forth raised possibility of trying Depakote to help with better seizure control, but patient and family declined due to potential side effects.   Patient also had home sleep study which ruled out sleep apnea.   Patient had a ambulatory EEG in November 2017 which showed generalized spike and wave discharges. Patient had one staring spell without EEG correlate. Therefore it was concluded that patient staring spells may not be related to epileptic discharges. Patient last seen at [facility] epilepsy clinic in April 2018.   Now patient presenting here to establish with local neurologist.   Family is concerned about his ongoing daily staring spells, tremors, joint pain, difficulty in working and living independently.    Observations/Objective:  GENERAL EXAM/CONSTITUTIONAL: Vitals:  Vitals:   05/31/21 1417  BP: 132/76  Pulse: 83  Weight: 234 lb (106.1 kg)  Height: '5\' 11"'$  (1.803 m)   Body mass index is 32.64 kg/m. Wt Readings from Last  3 Encounters:  05/31/21 234 lb (106.1 kg)  03/28/21 225 lb (102.1 kg)  08/09/20 220 lb (99.8 kg)   Patient is in no distress; well developed, nourished and groomed; neck is supple  CARDIOVASCULAR: Examination of carotid arteries is normal; no carotid bruits Regular rate and rhythm, no murmurs Examination of peripheral vascular system by  observation and palpation is normal  EYES: Ophthalmoscopic exam of optic discs and posterior segments is normal; no papilledema or hemorrhages No results found.  MUSCULOSKELETAL: Gait, strength, tone, movements noted in Neurologic exam below  NEUROLOGIC: MENTAL STATUS:  No flowsheet data found. awake, alert, oriented to person, place and time recent and remote memory intact normal attention and concentration language fluent, comprehension intact, naming intact fund of knowledge appropriate  CRANIAL NERVE:  2nd - no papilledema on fundoscopic exam 2nd, 3rd, 4th, 6th - pupils equal and reactive to light, visual fields full to confrontation, extraocular muscles intact, no nystagmus 5th - facial sensation symmetric 7th - facial strength symmetric 8th - hearing intact 9th - palate elevates symmetrically, uvula midline 11th - shoulder shrug symmetric 12th - tongue protrusion midline  MOTOR:  normal bulk and tone, full strength in the BUE, BLE  SENSORY:  normal and symmetric to light touch, temperature, vibration  COORDINATION:  finger-nose-finger, fine finger movements normal  REFLEXES:  deep tendon reflexes present and symmetric  GAIT/STATION:  narrow based gait    Assessment and Plan:  30 y.o. male here with history of seizures since age 31 years old, likely primary generalized epilepsy. Patient has had intermittent grand mal seizures, last occurring in 2016. Now with ongoing intermittent daily spells, decreased attention, decreased processing speed. These were raised as possibly nonepileptic spells.   Last grand mal seizures: Nov 2016, May 2021   Daily staring spells.   Meds tried: lamictal, zarontin, keppra     Dx:   1. Nonintractable generalized idiopathic epilepsy without status epilepticus ([place])   2. Spell of abnormal behavior      PLAN:   SEIZURE DISORDER (primary generalized epilepsy) - continue lamictal '400mg'$  (2 x 200) in AM and '700mg'$  (3.5 x 200)  in PM (brand medically necessary) - continue ETHOSUXAMIDE '250mg'$  twice a day - in future may consider transition to depakote, which is very effective for primary generalized epilepsy; need caution due to VPA-LTG drug interactions - not currently driving   Meds ordered this encounter  Medications   ethosuximide (ZARONTIN) 250 MG capsule    Sig: Take 2 capsules (500 mg total) by mouth 2 (two) times daily.    Dispense:  360 capsule    Refill:  4   LAMICTAL 200 MG tablet    Sig: Take 2 tablets (400 mg total) by mouth daily AND 3.5 tablets (700 mg total) every evening. BRAND MEDICALLY NECESSARY.    Dispense:  500 tablet    Refill:  4    Follow Up Instructions:  - Return in about 1 year (around 05/31/2022) for virtual visit (15 min).     Penni Bombard, MD [id number], 0000000 PM Certified in Neurology, Neurophysiology and Neuroimaging  [facility] [address] [place], [place] [address] [phone]

## 2021-06-22 ENCOUNTER — Telehealth (INDEPENDENT_AMBULATORY_CARE_PROVIDER_SITE_OTHER): Payer: Medicare Other | Admitting: Internal Medicine

## 2021-06-22 DIAGNOSIS — F419 Anxiety disorder, unspecified: Secondary | ICD-10-CM | POA: Diagnosis not present

## 2021-06-22 DIAGNOSIS — U071 COVID-19: Secondary | ICD-10-CM

## 2021-06-22 DIAGNOSIS — R739 Hyperglycemia, unspecified: Secondary | ICD-10-CM

## 2021-06-22 MED ORDER — HYDROCODONE BIT-HOMATROP MBR 5-1.5 MG/5ML PO SOLN: 5.0000 mL | Freq: Four times a day (QID) | ORAL | 0 refills | Status: DC | PRN

## 2021-06-22 MED ORDER — MOLNUPIRAVIR EUA 200MG CAPSULE: 4.0000 | ORAL_CAPSULE | Freq: Two times a day (BID) | ORAL | 0 refills | Status: DC

## 2021-06-22 MED ORDER — MOLNUPIRAVIR EUA 200MG CAPSULE: 4.0000 | ORAL_CAPSULE | Freq: Two times a day (BID) | ORAL | 0 refills | Status: AC

## 2021-06-22 MED ORDER — HYDROCODONE BIT-HOMATROP MBR 5-1.5 MG/5ML PO SOLN: 5.0000 mL | Freq: Four times a day (QID) | ORAL | 0 refills | Status: AC | PRN

## 2021-06-22 NOTE — Progress Notes (Signed)
Patient ID: Christopher Reed, male   DOB: 07/21/1991, 30 y.o.   MRN: [id number]  Virtual Visit via Video Note  I connected with [name] on 06/26/21 at 10:20 AM EDT by a video enabled telemedicine application and verified that I am speaking with the correct person using two identifiers.  Location of all participants today Patient: at home Provider: at office   I discussed the limitations of evaluation and management by telemedicine and the availability of in person appointments. The patient expressed understanding and agreed to proceed.  History of Present Illness: 30 yo M who presents with covid symptoms starting 5 days ago with ST, congestion, runny nose, cough. HA, but intact tastes and smell, no nausea or diarrhea, nor sob or fever chills.  Pt s/p covid vax x 2, no booster.  No prior hx of covid infection.  Mother ill with same.  Pt denies chest pain, increased sob or doe, wheezing, orthopnea, PND, increased LE swelling, palpitations, dizziness or syncope.   Pt denies polydipsia, polyuria, or new focal neuro s/s  Denies worsening depressive symptoms, suicidal ideation, or panic Past Medical History:  Diagnosis Date   Anxiety    Seizures ([place])    epilepsy since age 66yr, sz 03/20/20   SINUSITIS- ACUTE-NOS 09/25/2010   TESTICULAR MASS 11/09/2009   UTI 11/09/2009   Past Surgical History:  Procedure Laterality Date   ANKLE ARTHROSCOPY Left    DG THUMB RIGHT HAND ([facility])     2-3 yrs ago 2015 or 2016   Knee surgury  03/2006   Right knee - meniscus tear/minor ACL tear   LIGAMENT REPAIR Right 05/07/2013   Procedure: cheilectomy, microfracture of metacarpal head surface right thumb;  Surgeon: RCammie Sickle, MD;  [facility]  Service: Orthopedics;  Laterality: Right;   MYRINGOTOMY WITH TUBE PLACEMENT     as child   TOE SURGERY Right     reports that he has never smoked. He has never used smokeless tobacco. He reports that he does not drink alcohol  and does not use drugs. family history includes Arthritis in an other family member; Diabetes in his father and another family member; Hepatitis C in his paternal grandmother; Hypertension in his father, mother, and another family member; Other in his maternal grandfather; Pneumonia in his paternal grandfather. Allergies  Allergen Reactions   Bee Venom     angioedema   Current Outpatient Medications on File Prior to Visit  Medication Sig Dispense Refill   EPINEPHrine (EPIPEN) 0.3 mg/0.3 mL IJ SOAJ injection Inject 0.3 mLs (0.3 mg total) into the muscle once. 2 Device 1   ethosuximide (ZARONTIN) 250 MG capsule Take 2 capsules (500 mg total) by mouth 2 (two) times daily. 360 capsule 4   LAMICTAL 200 MG tablet Take 2 tablets (400 mg total) by mouth daily AND 3.5 tablets (700 mg total) every evening. BRAND MEDICALLY NECESSARY. 500 tablet 4   No current facility-administered medications on file prior to visit.    Observations/Objective: Alert, NAD, appropriate mood and affect, resps normal, cn 2-12 intact, moves all 4s, no visible rash or swelling Lab Results  Component Value Date   WBC 5.7 08/09/2020   HGB 15.7 08/09/2020   HCT 46.0 08/09/2020   PLT 268.0 08/09/2020   GLUCOSE 85 08/09/2020   CHOL 153 08/09/2020   TRIG 173.0 (H) 08/09/2020   HDL 33.40 (L) 08/09/2020   LDLCALC 85 08/09/2020   ALT 24 08/09/2020   AST 15 08/09/2020  NA 138 08/09/2020   K 3.8 08/09/2020   CL 101 08/09/2020   CREATININE 1.32 08/09/2020   BUN 16 08/09/2020   CO2 31 08/09/2020   TSH 0.82 08/09/2020   HGBA1C 5.8 04/05/2020   Assessment and Plan: See notes  Follow Up Instructions: See notes   I discussed the assessment and treatment plan with the patient. The patient was provided an opportunity to ask questions and all were answered. The patient agreed with the plan and demonstrated an understanding of the instructions.   The patient was advised to call back or seek an in-person evaluation if the  symptoms worsen or if the condition fails to improve as anticipated.   [name], MD

## 2021-06-26 ENCOUNTER — Encounter: Payer: Self-pay | Admitting: Internal Medicine

## 2021-06-26 DIAGNOSIS — U071 COVID-19: Secondary | ICD-10-CM | POA: Insufficient documentation

## 2021-06-26 NOTE — Assessment & Plan Note (Signed)
Lab Results  Component Value Date   HGBA1C 5.8 04/05/2020   Stable, pt to continue current medical treatment  - diet

## 2021-06-26 NOTE — Patient Instructions (Signed)
Please take all new medication as prescribed 

## 2021-06-26 NOTE — Assessment & Plan Note (Signed)
New onset < 5 days, pt does not want paxlovid, ok for molnupiraivr and cough med prn, d/w pt natural hx of disease and to f/u and worsening s/s

## 2021-06-26 NOTE — Assessment & Plan Note (Signed)
Mild situational, pt to continue current tx -  reassurance

## 2021-06-29 ENCOUNTER — Other Ambulatory Visit: Payer: Self-pay

## 2021-06-29 ENCOUNTER — Ambulatory Visit (INDEPENDENT_AMBULATORY_CARE_PROVIDER_SITE_OTHER): Payer: Medicare Other | Admitting: Sports Medicine

## 2021-06-29 VITALS — Ht 71.0 in | Wt 230.0 lb

## 2021-06-29 DIAGNOSIS — M545 Low back pain, unspecified: Secondary | ICD-10-CM

## 2021-06-29 NOTE — Progress Notes (Addendum)
   Subjective:    Patient ID: Christopher Reed, male    DOB: 12/31/1990, 30 y.o.   MRN: [id number]  HPI Patient presenting with lower back pain and concern for nerve impingement.  He states he started having lower back pain about 1 week ago and had thought he may have thrown out his back from either lifting chairs or from frequent coughing due to recent upper respiratory infection.  Several days ago over the weekend, patient states he jumped out of his father's truck and then reported pain traveling from his back to both of his legs and caused his knees to buckle.  He reports he had pain traveling down both legs for a few days but is now resolved.  He has been doing back exercises at home.  He still has a little bit of back soreness today.  Has not been taking any medications.  Denies saddle anesthesia, leg weakness, bowel/bladder incontinence.   Review of Systems     Objective:   Physical Exam Alert, NAD  MSK: No obvious deformity.  No tenderness to palpation along midline spine or paraspinal muscles.  Full range of motion with flexion, extension, rotation, and lateral flexion of the back without pain.  5/5 strength with hip flexion, knee extension, dorsiflexion, plantarflexion.  2+ reflexes patellar and Achilles bilaterally.  Negative straight leg raise bilaterally.       Assessment & Plan:   Lower back pain  Based on history, there appears to have been some sort of radicular component with radiation of pain down both legs however this has resolved.  No red flag symptoms and no history of trauma.  No indication for further imaging at this time.  We will give home spine neutral exercises and consider physical therapy if this does not improve.  Follow-up as needed.  Zola Button, MD  PGY-2

## 2021-07-06 ENCOUNTER — Ambulatory Visit (INDEPENDENT_AMBULATORY_CARE_PROVIDER_SITE_OTHER): Payer: Medicare Other

## 2021-07-06 DIAGNOSIS — Z23 Encounter for immunization: Secondary | ICD-10-CM | POA: Diagnosis not present

## 2021-08-07 ENCOUNTER — Telehealth: Payer: Self-pay | Admitting: Internal Medicine

## 2021-08-07 DIAGNOSIS — R739 Hyperglycemia, unspecified: Secondary | ICD-10-CM

## 2021-08-07 DIAGNOSIS — E559 Vitamin D deficiency, unspecified: Secondary | ICD-10-CM

## 2021-08-07 DIAGNOSIS — E538 Deficiency of other specified B group vitamins: Secondary | ICD-10-CM

## 2021-08-07 DIAGNOSIS — Z0000 Encounter for general adult medical examination without abnormal findings: Secondary | ICD-10-CM

## 2021-08-07 NOTE — Telephone Encounter (Signed)
Pt. Has come into office requesting labwork before his physical on 10.21.22. Would like a call or MyChart message if the order is put in   C allback #- [phone]

## 2021-08-07 NOTE — Telephone Encounter (Signed)
Ok this is done 

## 2021-08-08 NOTE — Telephone Encounter (Signed)
Left message on patient's voicemail.

## 2021-08-11 ENCOUNTER — Other Ambulatory Visit: Payer: Self-pay

## 2021-08-11 ENCOUNTER — Encounter: Payer: Self-pay | Admitting: Internal Medicine

## 2021-08-11 ENCOUNTER — Ambulatory Visit (INDEPENDENT_AMBULATORY_CARE_PROVIDER_SITE_OTHER): Payer: Medicare Other | Admitting: Internal Medicine

## 2021-08-11 VITALS — BP 128/80 | HR 81 | Ht 71.0 in | Wt 234.0 lb

## 2021-08-11 DIAGNOSIS — R739 Hyperglycemia, unspecified: Secondary | ICD-10-CM | POA: Diagnosis not present

## 2021-08-11 DIAGNOSIS — E559 Vitamin D deficiency, unspecified: Secondary | ICD-10-CM

## 2021-08-11 DIAGNOSIS — E538 Deficiency of other specified B group vitamins: Secondary | ICD-10-CM | POA: Diagnosis not present

## 2021-08-11 DIAGNOSIS — K219 Gastro-esophageal reflux disease without esophagitis: Secondary | ICD-10-CM | POA: Diagnosis not present

## 2021-08-11 DIAGNOSIS — Z0001 Encounter for general adult medical examination with abnormal findings: Secondary | ICD-10-CM

## 2021-08-11 DIAGNOSIS — Z1159 Encounter for screening for other viral diseases: Secondary | ICD-10-CM | POA: Diagnosis not present

## 2021-08-11 DIAGNOSIS — Z6832 Body mass index (BMI) 32.0-32.9, adult: Secondary | ICD-10-CM

## 2021-08-11 DIAGNOSIS — E6609 Other obesity due to excess calories: Secondary | ICD-10-CM

## 2021-08-11 DIAGNOSIS — Z0000 Encounter for general adult medical examination without abnormal findings: Secondary | ICD-10-CM

## 2021-08-11 DIAGNOSIS — E669 Obesity, unspecified: Secondary | ICD-10-CM

## 2021-08-11 LAB — URINALYSIS, ROUTINE W REFLEX MICROSCOPIC
Bilirubin Urine: NEGATIVE
Hgb urine dipstick: NEGATIVE
Ketones, ur: NEGATIVE
Leukocytes,Ua: NEGATIVE
Nitrite: NEGATIVE
Specific Gravity, Urine: 1.015 (ref 1.000–1.030)
Total Protein, Urine: NEGATIVE
Urine Glucose: NEGATIVE
Urobilinogen, UA: 0.2 (ref 0.0–1.0)
pH: 7.5 (ref 5.0–8.0)

## 2021-08-11 LAB — LIPID PANEL
Cholesterol: 154 mg/dL (ref 0–200)
HDL: 37.2 mg/dL — ABNORMAL LOW (ref >39.00)
NonHDL: 116.82
Total CHOL/HDL Ratio: 4
Triglycerides: 258 mg/dL — ABNORMAL HIGH (ref 0.0–149.0)
VLDL: 51.6 mg/dL — ABNORMAL HIGH (ref 0.0–40.0)

## 2021-08-11 LAB — CBC WITH DIFFERENTIAL/PLATELET
Basophils Absolute: 0.1 10*3/uL (ref 0.0–0.1)
Basophils Relative: 1.2 % (ref 0.0–3.0)
Eosinophils Absolute: 0.1 10*3/uL (ref 0.0–0.7)
Eosinophils Relative: 3.1 % (ref 0.0–5.0)
HCT: 46.4 % (ref 39.0–52.0)
Hemoglobin: 15.9 g/dL (ref 13.0–17.0)
Lymphocytes Relative: 32.4 % (ref 12.0–46.0)
Lymphs Abs: 1.5 10*3/uL (ref 0.7–4.0)
MCHC: 34.2 g/dL (ref 30.0–36.0)
MCV: 95.4 fl (ref 78.0–100.0)
Monocytes Absolute: 0.4 10*3/uL (ref 0.1–1.0)
Monocytes Relative: 8.5 % (ref 3.0–12.0)
Neutro Abs: 2.5 10*3/uL (ref 1.4–7.7)
Neutrophils Relative %: 54.8 % (ref 43.0–77.0)
Platelets: 250 10*3/uL (ref 150.0–400.0)
RBC: 4.86 Mil/uL (ref 4.22–5.81)
RDW: 12.9 % (ref 11.5–15.5)
WBC: 4.6 10*3/uL (ref 4.0–10.5)

## 2021-08-11 LAB — LDL CHOLESTEROL, DIRECT: Direct LDL: 96 mg/dL

## 2021-08-11 LAB — HEPATIC FUNCTION PANEL
ALT: 30 U/L (ref 0–53)
AST: 20 U/L (ref 0–37)
Albumin: 5 g/dL (ref 3.5–5.2)
Alkaline Phosphatase: 77 U/L (ref 39–117)
Bilirubin, Direct: 0.1 mg/dL (ref 0.0–0.3)
Total Bilirubin: 0.5 mg/dL (ref 0.2–1.2)
Total Protein: 7.7 g/dL (ref 6.0–8.3)

## 2021-08-11 LAB — BASIC METABOLIC PANEL
BUN: 14 mg/dL (ref 6–23)
CO2: 30 mEq/L (ref 19–32)
Calcium: 9.6 mg/dL (ref 8.4–10.5)
Chloride: 101 mEq/L (ref 96–112)
Creatinine, Ser: 1.03 mg/dL (ref 0.40–1.50)
GFR: 97.27 mL/min (ref >60.00)
Glucose, Bld: 87 mg/dL (ref 70–99)
Potassium: 3.7 mEq/L (ref 3.5–5.1)
Sodium: 138 mEq/L (ref 135–145)

## 2021-08-11 LAB — HEMOGLOBIN A1C: Hgb A1c MFr Bld: 5 % (ref 4.6–6.5)

## 2021-08-11 LAB — VITAMIN B12: Vitamin B-12: 487 pg/mL (ref 211–911)

## 2021-08-11 LAB — TSH: TSH: 0.77 u[IU]/mL (ref 0.35–5.50)

## 2021-08-11 LAB — VITAMIN D 25 HYDROXY (VIT D DEFICIENCY, FRACTURES): VITD: 22.73 ng/mL — ABNORMAL LOW (ref 30.00–100.00)

## 2021-08-11 NOTE — Patient Instructions (Signed)
Please take OTC Vitamin D3 at 2000 units per day, indefinitely  Please continue all other medications as before, and refills have been done if requested.  Please have the pharmacy call with any other refills you may need.  Please continue your efforts at being more active, low cholesterol diet, and weight control.  You are otherwise up to date with prevention measures today.  Please keep your appointments with your specialists as you may have planned  Please go to the LAB at the blood drawing area for the tests to be done  You will be contacted by phone if any changes need to be made immediately.  Otherwise, you will receive a letter about your results with an explanation, but please check with MyChart first.  Please remember to sign up for MyChart if you have not done so, as this will be important to you in the future with finding out test results, communicating by private email, and scheduling acute appointments online when needed.  Please make an Appointment to return for your 1 year visit, or sooner if needed, with Lab testing by Appointment as well, to be done about 3-5 days before at the [facility] (so this is for TWO appointments - please see the scheduling desk as you leave)   Due to the ongoing Covid 19 pandemic, our lab now requires an appointment for any labs done at our office.  If you need labs done and do not have an appointment, please call our office ahead of time to schedule before presenting to the lab for your testing.

## 2021-08-11 NOTE — Progress Notes (Signed)
Patient ID: Christopher Reed, male   DOB: [birth year], 30 y.o.   MRN: [id number]         Chief Complaint:: wellness exam and low vit d, hypereglycemia, gerd, obesity       HPI:  Christopher Reed is a 30 y.o. male here for wellness exam; declines tdap, o/w up to date                       Also not taking vit d.  Pt denies chest pain, increased sob or doe, wheezing, orthopnea, PND, increased LE swelling, palpitations, dizziness or syncope.   Pt denies polydipsia, polyuria, or new focal neuro s/s,   Pt denies fever, wt loss, night sweats, loss of appetite, or other constitutional symptoms  has mild worsening reflux controlled with TUMS prn, but no abd pain, dysphagia, n/v, bowel change or blood.  Wt has increased again, ahrd to lose.     Wt Readings from Last 3 Encounters:  08/11/21 234 lb (106.1 kg)  06/29/21 230 lb (104.3 kg)  05/31/21 234 lb (106.1 kg)   BP Readings from Last 3 Encounters:  08/11/21 128/80  05/31/21 132/76  03/28/21 132/84   Immunization History  Administered Date(s) Administered   Influenza Split 07/26/2011, 07/31/2012   Influenza Whole 09/25/2010   Influenza,inj,Quad PF,6+ Mos 07/17/2013, 07/30/2014, 07/22/2015, 06/15/2016, 07/03/2017, 08/07/2018, 07/10/2019, 07/08/2020, 07/06/2021   Moderna Sars-Covid-2 Vaccination 12/31/2019, 02/02/2020   Td 11/09/2009   Health Maintenance Due  Topic Date Due   Hepatitis C Screening  Never done      Past Medical History:  Diagnosis Date   Anxiety    Seizures ([place])    epilepsy since age 51yrs, sz 03/20/20   SINUSITIS- ACUTE-NOS 09/25/2010   TESTICULAR MASS 11/09/2009   UTI 11/09/2009   Past Surgical History:  Procedure Laterality Date   ANKLE ARTHROSCOPY Left    DG THUMB RIGHT HAND ([place] [facility])     2-3 yrs ago 2015 or 2016   Knee surgury  03/2006   Right knee - meniscus tear/minor ACL tear   LIGAMENT REPAIR Right 05/07/2013   Procedure: cheilectomy, microfracture of metacarpal head surface right thumb;  Surgeon: Cammie Sickle., MD;  Location: [facility];  Service: Orthopedics;  Laterality: Right;   MYRINGOTOMY WITH TUBE PLACEMENT     as child   TOE SURGERY Right     reports that he has never smoked. He has never used smokeless tobacco. He reports that he does not drink alcohol and does not use drugs. family history includes Arthritis in an other family member; Diabetes in his father and another family member; Hepatitis C in his paternal grandmother; Hypertension in his father, mother, and another family member; Other in his maternal grandfather; Pneumonia in his paternal grandfather. Allergies  Allergen Reactions   Bee Venom     angioedema   Current Outpatient Medications on File Prior to Visit  Medication Sig Dispense Refill   EPINEPHrine (EPIPEN) 0.3 mg/0.3 mL IJ SOAJ injection Inject 0.3 mLs (0.3 mg total) into the muscle once. 2 Device 1   ethosuximide (ZARONTIN) 250 MG capsule Take 2 capsules (500 mg total) by mouth 2 (two) times daily. 360 capsule 4   LAMICTAL 200 MG tablet Take 2 tablets (400 mg total) by mouth daily AND 3.5 tablets (700 mg total) every evening. BRAND MEDICALLY NECESSARY. 500 tablet 4   No current facility-administered medications on file prior to visit.  ROS:  All others reviewed and negative.  Objective        PE:  BP 128/80 (BP Location: Right Arm, Patient Position: Sitting, Cuff Size: Large)   Pulse 81   Ht 5\' 11"  (1.803 m)   Wt 234 lb (106.1 kg)   SpO2 98%   BMI 32.64 kg/m                 Constitutional: Pt appears in NAD               HENT: Head: NCAT.                Right Ear: External ear normal.                 Left Ear: External ear normal.                Eyes: . Pupils are equal, round, and reactive to light. Conjunctivae and EOM are normal               Nose: without d/c or deformity               Neck: Neck supple. Gross normal ROM               Cardiovascular: Normal rate and regular rhythm.                 Pulmonary/Chest: Effort  normal and breath sounds without rales or wheezing.                Abd:  Soft, NT, ND, + BS, no organomegaly               Neurological: Pt is alert. At baseline orientation, motor grossly intact               Skin: Skin is warm. No rashes, no other new lesions, LE edema - none               Psychiatric: Pt behavior is normal without agitation   Micro: none  Cardiac tracings I have personally interpreted today:  none  Pertinent Radiological findings (summarize): none   Lab Results  Component Value Date   WBC 4.6 08/11/2021   HGB 15.9 08/11/2021   HCT 46.4 08/11/2021   PLT 250.0 08/11/2021   GLUCOSE 87 08/11/2021   CHOL 154 08/11/2021   TRIG 258.0 (H) 08/11/2021   HDL 37.20 (L) 08/11/2021   LDLDIRECT 96.0 08/11/2021   LDLCALC 85 08/09/2020   ALT 30 08/11/2021   AST 20 08/11/2021   NA 138 08/11/2021   K 3.7 08/11/2021   CL 101 08/11/2021   CREATININE 1.03 08/11/2021   BUN 14 08/11/2021   CO2 30 08/11/2021   TSH 0.77 08/11/2021   HGBA1C 5.0 08/11/2021   Assessment/Plan:  Christopher Reed is a 30 y.o. White or Caucasian [1] male with  has a past medical history of Anxiety, Seizures ([place]), SINUSITIS- ACUTE-NOS (09/25/2010), TESTICULAR MASS (11/09/2009), and UTI (11/09/2009).  Vitamin D deficiency Last vitamin D Lab Results  Component Value Date   VD25OH 24.95 (L) 08/09/2020   Low, to start oral replacement   Encounter for well adult exam with abnormal findings Age and sex appropriate education and counseling updated with regular exercise and diet Referrals for preventative services - none needed Immunizations addressed - declines tdap Smoking counseling  - none needed Evidence for depression or other mood disorder - none significant Most recent labs reviewed. I have personally reviewed  and have noted: 1) the patient's medical and social history 2) The patient's current medications and supplements 3) The patient's height, weight, and BMI have been recorded in the  chart   Hyperglycemia Lab Results  Component Value Date   HGBA1C 5.0 08/11/2021   Stable, pt to continue current medical treatment  - diet   GERD (gastroesophageal reflux disease) Mild, for tums prn,  to f/u any worsening symptoms or concerns   Obesity D/w pt, for increased activity, lower calories,  to f/u any worsening symptoms or concerns  Followup: Return in about 1 year (around 08/11/2022).  [name], MD 08/12/2021 6:16 PM [facility]

## 2021-08-11 NOTE — Assessment & Plan Note (Signed)
Last vitamin D Lab Results  Component Value Date   VD25OH 24.95 (L) 08/09/2020   Low, to start oral replacement

## 2021-08-12 ENCOUNTER — Encounter: Payer: Self-pay | Admitting: Internal Medicine

## 2021-08-12 NOTE — Assessment & Plan Note (Signed)
D/w pt, for increased activity, lower calories,  to f/u any worsening symptoms or concerns

## 2021-08-12 NOTE — Assessment & Plan Note (Signed)
Lab Results  Component Value Date   HGBA1C 5.0 08/11/2021   Stable, pt to continue current medical treatment  - diet

## 2021-08-12 NOTE — Assessment & Plan Note (Signed)
Age and sex appropriate education and counseling updated with regular exercise and diet Referrals for preventative services - none needed Immunizations addressed - declines tdap Smoking counseling  - none needed Evidence for depression or other mood disorder - none significant Most recent labs reviewed. I have personally reviewed and have noted: 1) the patient's medical and social history 2) The patient's current medications and supplements 3) The patient's height, weight, and BMI have been recorded in the chart  

## 2021-08-12 NOTE — Assessment & Plan Note (Signed)
Mild, for tums prn,  to f/u any worsening symptoms or concerns

## 2021-08-14 LAB — HEPATITIS C ANTIBODY
Hepatitis C Ab: NONREACTIVE
SIGNAL TO CUT-OFF: 0.04 (ref <1.00)

## 2021-08-29 ENCOUNTER — Ambulatory Visit (INDEPENDENT_AMBULATORY_CARE_PROVIDER_SITE_OTHER): Payer: Medicare Other

## 2021-08-29 DIAGNOSIS — Z0000 Encounter for general adult medical examination without abnormal findings: Secondary | ICD-10-CM | POA: Diagnosis not present

## 2021-08-29 NOTE — Progress Notes (Signed)
I connected with Christopher Reed today by telephone and verified that I am speaking with the correct person using two identifiers. Location patient: home Location provider: work Persons participating in the virtual visit: patient, provider.   I discussed the limitations, risks, security and privacy concerns of performing an evaluation and management service by telephone and the availability of in person appointments. I also discussed with the patient that there may be a patient responsible charge related to this service. The patient expressed understanding and verbally consented to this telephonic visit.    Interactive audio and video telecommunications were attempted between this provider and patient, however failed, due to patient having technical difficulties OR patient did not have access to video capability.  We continued and completed visit with audio only.  Some vital signs may be absent or patient reported.   Time Spent with patient on telephone encounter: 30 minutes  Subjective:   Christopher Reed is a 30 y.o. male who presents for an Initial Medicare Annual Wellness Visit.  Review of Systems     Cardiac Risk Factors include: family history of premature cardiovascular disease;male gender     Objective:    There were no vitals filed for this visit. There is no height or weight on file to calculate BMI.  Advanced Directives 08/29/2021 03/20/2020 04/29/2013  Does Patient Have a Medical Advance Directive? No No Patient does not have advance directive  Would patient like information on creating a medical advance directive? No - Patient declined No - Patient declined -    Current Medications (verified) Outpatient Encounter Medications as of 08/29/2021  Medication Sig   ethosuximide (ZARONTIN) 250 MG capsule Take 2 capsules (500 mg total) by mouth 2 (two) times daily.   LAMICTAL 200 MG tablet Take 2 tablets (400 mg total) by mouth daily AND 3.5 tablets (700 mg total) every evening.  BRAND MEDICALLY NECESSARY.   EPINEPHrine (EPIPEN) 0.3 mg/0.3 mL IJ SOAJ injection Inject 0.3 mLs (0.3 mg total) into the muscle once.   No facility-administered encounter medications on file as of 08/29/2021.    Allergies (verified) Bee venom   History: Past Medical History:  Diagnosis Date   Anxiety    Seizures ([place])    epilepsy since age 69yrs, sz 03/20/20   SINUSITIS- ACUTE-NOS 09/25/2010   TESTICULAR MASS 11/09/2009   UTI 11/09/2009   Past Surgical History:  Procedure Laterality Date   ANKLE ARTHROSCOPY Left    DG THUMB RIGHT HAND ([place] [facility]X)     2-3 yrs ago 2015 or 2016   Knee surgury  03/2006   Right knee - meniscus tear/minor ACL tear   LIGAMENT REPAIR Right 05/07/2013   Procedure: cheilectomy, microfracture of metacarpal head surface right thumb;  Surgeon: Cammie Sickle., MD;  Location: [facility];  Service: Orthopedics;  Laterality: Right;   MYRINGOTOMY WITH TUBE PLACEMENT     as child   TOE SURGERY Right    Family History  Problem Relation Age of Onset   Diabetes Other        1st degree relative   Arthritis Other    Hypertension Other    Other Maternal Grandfather        Spinal Meningitis-Died at 86   Hepatitis C Paternal Grandmother        Died at 41   Pneumonia Paternal Grandfather        Died at 15   Hypertension Mother    Hypertension Father    Diabetes Father  type 2   Social History   Socioeconomic History   Marital status: Single    Spouse name: Not on file   Number of children: Not on file   Years of education: Not on file   Highest education level: Not on file  Occupational History   Not on file  Tobacco Use   Smoking status: Never   Smokeless tobacco: Never  Substance and Sexual Activity   Alcohol use: No   Drug use: No   Sexual activity: Never  Other Topics Concern   Not on file  Social History Narrative   Lives at home with parents.  Works at [employer].  HS Education.  Pt is single and no children.   Caffeine 2 cups avg when drinks.     Social Determinants of Health   Financial Resource Strain: Low Risk    Difficulty of Paying Living Expenses: Not hard at all  Food Insecurity: No Food Insecurity   Worried About Charity fundraiser in the Last Year: Never true   [place] in the Last Year: Never true  Transportation Needs: No Transportation Needs   Lack of Transportation (Medical): No   Lack of Transportation (Non-Medical): No  Physical Activity: Sufficiently Active   Days of Exercise per Week: 5 days   Minutes of Exercise per Session: 30 min  Stress: No Stress Concern Present   Feeling of Stress : Not at all  Social Connections: Moderately Integrated   Frequency of Communication with Friends and Family: More than three times a week   Frequency of Social Gatherings with Friends and Family: More than three times a week   Attends Religious Services: 1 to 4 times per year   Active Member of [employer] or Organizations: Yes   Attends Archivist Meetings: 1 to 4 times per year   Marital Status: Never married    Tobacco Counseling Counseling given: Not Answered   Clinical Intake:  Pre-visit preparation completed: Yes  Pain : No/denies pain     Nutritional Risks: None Diabetes: No  How often do you need to have someone help you when you read instructions, pamphlets, or other written materials from your doctor or pharmacy?: 1 - Never What is the last grade level you completed in school?: High School Graduate  Diabetic? no  Interpreter Needed?: No  Information entered by :: Lisette Abu, LPN   Activities of Daily Living In your present state of health, do you have any difficulty performing the following activities: 08/29/2021  Hearing? N  Vision? N  Difficulty concentrating or making decisions? N  Walking or climbing stairs? N  Dressing or bathing? N  Doing errands, shopping? N  Preparing Food and eating ? N  Using the Toilet? N  In the past six  months, have you accidently leaked urine? N  Do you have problems with loss of bowel control? N  Managing your Medications? N  Managing your Finances? N  Housekeeping or managing your Housekeeping? N  Some recent data might be hidden    Patient Care Team: Biagio Borg, MD as PCP - General  Indicate any recent Medical Services you may have received from other than [facility] providers in the past year (date may be approximate).     Assessment:   This is a routine wellness examination for Chrisney.  Hearing/Vision screen Hearing Screening - Comments:: Patient denied any hearing difficulty.     Vision Screening - Comments:: Patient wears corrective glasses/contacts.  Eye  exam done annually by: [facility]  Dietary issues and exercise activities discussed: Current Exercise Habits: Home exercise routine, Type of exercise: walking, Time (Minutes): 30, Frequency (Times/Week): 5, Weekly Exercise (Minutes/Week): 150, Intensity: Moderate, Exercise limited by: None identified   Goals Addressed   None   Depression Screen PHQ 2/9 Scores 08/29/2021 08/11/2021 08/11/2021 08/09/2020 08/09/2020 04/05/2020 07/10/2019  PHQ - 2 Score 0 0 0 0 0 0 0    Fall Risk Fall Risk  08/29/2021 08/11/2021 08/09/2020 08/09/2020 04/05/2020  Falls in the past year? 0 0 0 0 0  Number falls in past yr: 0 0 - 0 -  Injury with Fall? 0 0 - 0 -  Risk for fall due to : No Fall Risks - - No Fall Risks -  Follow up Falls evaluation completed - - Falls evaluation completed -    FALL RISK PREVENTION PERTAINING TO THE HOME:  Any stairs in or around the home? No  If so, are there any without handrails? No  Home free of loose throw rugs in walkways, pet beds, electrical cords, etc? Yes  Adequate lighting in your home to reduce risk of falls? Yes   ASSISTIVE DEVICES UTILIZED TO PREVENT FALLS:  Life alert? No  Use of a cane, walker or w/c? No  Grab bars in the bathroom? No  Shower chair or bench in shower? No   Elevated toilet seat or a handicapped toilet? No   TIMED UP AND GO:  Was the test performed? No .  Length of time to ambulate 10 feet: n/a sec.   Gait steady and fast without use of assistive device  Cognitive Function: Normal cognitive status assessed by direct observation by this Nurse Health Advisor. No abnormalities found.          Immunizations Immunization History  Administered Date(s) Administered   Influenza Split 07/26/2011, 07/31/2012   Influenza Whole 09/25/2010   Influenza,inj,Quad PF,6+ Mos 07/17/2013, 07/30/2014, 07/22/2015, 06/15/2016, 07/03/2017, 08/07/2018, 07/10/2019, 07/08/2020, 07/06/2021   Moderna Sars-Covid-2 Vaccination 12/31/2019, 02/02/2020   Td 11/09/2009    TDAP status: Due, Education has been provided regarding the importance of this vaccine. Advised may receive this vaccine at local pharmacy or Health Dept. Aware to provide a copy of the vaccination record if obtained from local pharmacy or Health Dept. Verbalized acceptance and understanding.  Flu Vaccine status: Up to date  Covid-19 vaccine status: Completed vaccines  Qualifies for Shingles Vaccine? No   Zostavax completed No   Shingrix Completed?: No.    Education has been provided regarding the importance of this vaccine. Patient has been advised to call insurance company to determine out of pocket expense if they have not yet received this vaccine. Advised may also receive vaccine at local pharmacy or Health Dept. Verbalized acceptance and understanding.  Screening Tests Health Maintenance  Topic Date Due   COVID-19 Vaccine (3 - Moderna risk series) 03/01/2020   Pneumococcal Vaccine 31-67 Years old (1 - PCV) 08/11/2022 (Originally 10/21/1996)   TETANUS/TDAP  08/12/2022 (Originally 11/10/2019)   INFLUENZA VACCINE  Completed   Hepatitis C Screening  Completed   HIV Screening  Completed   HPV VACCINES  Aged Out    Health Maintenance  Health Maintenance Due  Topic Date Due   COVID-19  Vaccine (3 - Moderna risk series) 03/01/2020    Colorectal Cancer Screening: not due until age 68  Lung Cancer Screening: (Low Dose CT Chest recommended if Age 13-80 years, 30 pack-year currently smoking OR have quit w/in 15years.) does not  qualify.   Lung Cancer Screening Referral: no  Additional Screening:  Hepatitis C Screening: does qualify; Completed yes  Vision Screening: Recommended annual ophthalmology exams for early detection of glaucoma and other disorders of the eye. Is the patient up to date with their annual eye exam?  Yes  Who is the provider or what is the name of the office in which the patient attends annual eye exams? [facility] If pt is not established with a provider, would they like to be referred to a provider to establish care? No .   Dental Screening: Recommended annual dental exams for proper oral hygiene  Community Resource Referral / Chronic Care Management: CRR required this visit?  No   CCM required this visit?  No      Plan:     I have personally reviewed and noted the following in the patient's chart:   Medical and social history Use of alcohol, tobacco or illicit drugs  Current medications and supplements including opioid prescriptions. Patient is not currently taking opioid prescriptions. Functional ability and status Nutritional status Physical activity Advanced directives List of other physicians Hospitalizations, surgeries, and ER visits in previous 12 months Vitals Screenings to include cognitive, depression, and falls Referrals and appointments  In addition, I have reviewed and discussed with patient certain preventive protocols, quality metrics, and best practice recommendations. A written personalized care plan for preventive services as well as general preventive health recommendations were provided to patient.     Sheral Flow, LPN   15/04/2619   Nurse Notes:  Patient is cogitatively intact. There were no  vitals filed for this visit. There is no height or weight on file to calculate BMI. Patient stated that he has no issues with gait or balance; does not use any assistive devices. Hearing Screening - Comments:: Patient denied any hearing difficulty.     Vision Screening - Comments:: Patient wears corrective glasses/contacts.  Eye exam done annually by: [facility]

## 2021-10-12 ENCOUNTER — Telehealth: Payer: Self-pay | Admitting: Diagnostic Neuroimaging

## 2021-10-12 NOTE — Telephone Encounter (Signed)
Lamictal 100 mg brand name approved non-formulary exception, until 10/21/2022. Called patient to inform him, and faxed approval letter to pharmacy.

## 2021-10-12 NOTE — Telephone Encounter (Signed)
Lamictal 200 mg brand only PA, key:  [id number]. Your information has been sent to [facility].

## 2021-10-12 NOTE — Telephone Encounter (Signed)
Like last yr, pt's insurance company needs a letter stating pt can only have brand name version for LAMICTAL and not generic.

## 2022-05-21 ENCOUNTER — Ambulatory Visit (INDEPENDENT_AMBULATORY_CARE_PROVIDER_SITE_OTHER): Payer: Medicare Other | Admitting: Family Medicine

## 2022-05-21 ENCOUNTER — Ambulatory Visit
Admission: RE | Admit: 2022-05-21 | Discharge: 2022-05-21 | Disposition: A | Discharge status: Home / Self Care | Payer: Medicare Other | Source: Ambulatory Visit | Attending: Family Medicine | Admitting: Family Medicine

## 2022-05-21 VITALS — BP 126/78 | Ht 71.0 in | Wt 225.0 lb

## 2022-05-21 DIAGNOSIS — M25561 Pain in right knee: Secondary | ICD-10-CM | POA: Diagnosis not present

## 2022-05-21 NOTE — Patient Instructions (Signed)
I'm concerned you tore the ACL in your knee. We will go ahead with an MRI. Ice 15 minutes at a time at least 3-4 times a day. Ibuprofen or aleve regularly for pain and inflammation. Elevate above your heart to help with swelling. Brace or sleeve to help with support and swelling. Straight leg raises and knee extensions to maintain quad strength. I will call you with MRI results and next steps.

## 2022-05-21 NOTE — Progress Notes (Unsigned)
PCP: Biagio Borg, MD  Subjective:   HPI: Patient is a 31 y.o. male here for right knee pain.  Patient was on a ladder on Saturday when he twisted wrong and felt his knee "shift and pop." His pain is primarily on the medial side of his knee. He has noticed some significant swelling though no bruising. His knee does not click or lock while walking. He does have a limp due to the pain. He has been icing and elevating his leg. He has taken ibuprofen and Tylenol occasionally.  Past Medical History:  Diagnosis Date   Anxiety    Seizures ([place])    epilepsy since age 64yr, sz 03/20/20   SINUSITIS- ACUTE-NOS 09/25/2010   TESTICULAR MASS 11/09/2009   UTI 11/09/2009    Current Outpatient Medications on File Prior to Visit  Medication Sig Dispense Refill   EPINEPHrine (EPIPEN) 0.3 mg/0.3 mL IJ SOAJ injection Inject 0.3 mLs (0.3 mg total) into the muscle once. 2 Device 1   ethosuximide (ZARONTIN) 250 MG capsule Take 2 capsules (500 mg total) by mouth 2 (two) times daily. 360 capsule 4   LAMICTAL 200 MG tablet Take 2 tablets (400 mg total) by mouth daily AND 3.5 tablets (700 mg total) every evening. BRAND MEDICALLY NECESSARY. 500 tablet 4   No current facility-administered medications on file prior to visit.    Past Surgical History:  Procedure Laterality Date   ANKLE ARTHROSCOPY Left    [place]B RIGHT [place]     2-3 yrs ago 2015 or 2016   Knee surgury  03/2006   Right knee - meniscus tear/minor ACL tear   LIGAMENT REPAIR Right 05/07/2013   Procedure: cheilectomy, microfracture of metacarpal head surface right thumb;  Surgeon: RCammie Sickle, MD;  [place] [facility]  Service: Orthopedics;  Laterality: Right;   MYRINGOTOMY WITH TUBE PLACEMENT     as child   TOE SURGERY Right     Allergies  Allergen Reactions   Bee Venom     angioedema    BP 126/78   Ht '5\' 11"'$  (1.803 m)   Wt 225 lb (102.1 kg)   BMI 31.38 kg/m      06/16/2020   11:32 AM 06/30/2020    11:19 AM  [facility] Adult Exercise  Frequency of aerobic exercise (# of days/week) 2 2  Average time in minutes 20 30  Frequency of strengthening activities (# of days/week) 0 1        No data to display              Objective:  Physical Exam:  Gen: NAD, comfortable in exam room Knee, Right: Inspection was positive for erythema, ecchymosis, and effusion. No obvious bony abnormalities or signs of osteophyte development. Palpation yielded asymmetric warmth on the medial side; Medial joint line tenderness; No condyle tenderness; No patellar tenderness; No*** knee crepitus. Patellar and quadriceps tendons unremarkable. ROM limited: flexion (100 degrees) and extension (2 degrees). Involuntary guarding on exam.   Provocative Testing:    - Patella:   - Patellar grind/compression: NEG   - Patellar glide: Appropriate medial/lateral glide without apprehension - Cruciate Ligaments:   - Anterior Drawer: Positive - Lachman test: Equivocal - Posterior Drawer: NEG  - Collateral Ligaments:   - Varus/Valgus (MCL/LCL) Stress test at 0, 15d: NEG  - Meniscus:   - Apley's Compression/Distraction test: NEG   - McMurray's: NEG     Assessment & Plan:  1. Right knee  pain concerning for ACL tear. Recommend rest, ice, compression, and elevation. Can take ibuprofen or Aleve as needed. He should begin to do basic straight leg raises and knee extensions to help with quad strengthening. Recommend MRI to access for ACL or other ligamentous tears. Follow up after MRI.   Virgel Manifold, MS4

## 2022-05-22 ENCOUNTER — Ambulatory Visit
Admission: RE | Admit: 2022-05-22 | Discharge: 2022-05-22 | Disposition: A | Discharge status: Home / Self Care | Payer: Medicare Other | Source: Ambulatory Visit | Attending: Family Medicine | Admitting: Family Medicine

## 2022-05-22 DIAGNOSIS — M25561 Pain in right knee: Secondary | ICD-10-CM

## 2022-05-22 DIAGNOSIS — S8991XA Unspecified injury of right lower leg, initial encounter: Secondary | ICD-10-CM | POA: Diagnosis not present

## 2022-05-22 DIAGNOSIS — M7989 Other specified soft tissue disorders: Secondary | ICD-10-CM | POA: Diagnosis not present

## 2022-05-23 ENCOUNTER — Telehealth (INDEPENDENT_AMBULATORY_CARE_PROVIDER_SITE_OTHER): Payer: Medicare Other | Admitting: Family Medicine

## 2022-05-23 ENCOUNTER — Encounter: Payer: Self-pay | Admitting: Family Medicine

## 2022-05-23 DIAGNOSIS — M25561 Pain in right knee: Secondary | ICD-10-CM

## 2022-05-23 NOTE — Progress Notes (Signed)
MRI reviewed and discussed with patient via virtual visit.  ACL, meniscus are intact.  Suffered a patellar subluxation vs dislocation episode with MPFL tear, very small defect posterior patella.  Will treat conservatively - has knee brace (uncertain where his old sheild's brace is but current one is sufficient) to wear when up and walking around.  Icing, elevation, quad strengthening exercises we reviewed, ibuprofen or aleve.  F/u in 2 weeks for reevaluation.

## 2022-05-30 ENCOUNTER — Ambulatory Visit (INDEPENDENT_AMBULATORY_CARE_PROVIDER_SITE_OTHER): Payer: Medicare Other | Admitting: Family Medicine

## 2022-05-30 VITALS — BP 124/82 | Ht 71.0 in | Wt 230.0 lb

## 2022-05-30 DIAGNOSIS — M25561 Pain in right knee: Secondary | ICD-10-CM | POA: Diagnosis not present

## 2022-05-30 NOTE — Patient Instructions (Signed)
Your knee is where I expect it to be at this point. Continue wearing the brace, icing, elevating, doing the home exercises. Follow up with me as scheduled in a week - we will discuss doing formal physical therapy at that visit.

## 2022-06-01 ENCOUNTER — Other Ambulatory Visit: Payer: Self-pay | Admitting: Diagnostic Neuroimaging

## 2022-06-01 ENCOUNTER — Encounter: Payer: Self-pay | Admitting: Family Medicine

## 2022-06-01 NOTE — Progress Notes (Signed)
PCP: Biagio Borg, MD  Subjective:   HPI: Patient is a 31 y.o. male here for right knee swelling.  7/31: Patient was on a ladder on Saturday when he twisted wrong and felt his knee "shift and pop." His pain is primarily on the medial side of his knee. He has noticed some significant swelling though no bruising. His knee does not click or lock while walking. He does have a limp due to the pain. He has been icing and elevating his leg. He has taken ibuprofen and Tylenol occasionally.  Has history of right knee surgery in past - ? MPFL repair, ACL reconstruction.  8/9: Patient returns with persistent right knee swelling. Wants to see if something needs to be done for this or if this is normal at this stage. He's wearing his knee brace when up and walking around. Swelling is improved currently. Pain he experiences is medial. He has follow up scheduled in 1 week.  Past Medical History:  Diagnosis Date   Anxiety    Seizures ([place])    epilepsy since age 36yr, sz 03/20/20   SINUSITIS- ACUTE-NOS 09/25/2010   TESTICULAR MASS 11/09/2009   UTI 11/09/2009    Current Outpatient Medications on File Prior to Visit  Medication Sig Dispense Refill   EPINEPHrine (EPIPEN) 0.3 mg/0.3 mL IJ SOAJ injection Inject 0.3 mLs (0.3 mg total) into the muscle once. 2 Device 1   ethosuximide (ZARONTIN) 250 MG capsule Take 2 capsules (500 mg total) by mouth 2 (two) times daily. 360 capsule 4   LAMICTAL 200 MG tablet Take 2 tablets (400 mg total) by mouth daily AND 3.5 tablets (700 mg total) every evening. BRAND MEDICALLY NECESSARY. 500 tablet 4   No current facility-administered medications on file prior to visit.    Past Surgical History:  Procedure Laterality Date   ANKLE ARTHROSCOPY Left    DG THUMB RIGHT HAND ([place])     2-3 yrs ago 2015 or 2016   Knee surgury  03/2006   Right knee - meniscus tear/minor ACL tear   LIGAMENT REPAIR Right 05/07/2013   Procedure: cheilectomy, microfracture of metacarpal  head surface right thumb;  Surgeon: RCammie Sickle, MD;  [place] [facility]  Service: Orthopedics;  Laterality: Right;   MYRINGOTOMY WITH TUBE PLACEMENT     as child   TOE SURGERY Right     Allergies  Allergen Reactions   Bee Venom     angioedema    BP 124/82   Ht '5\' 11"'$  (1.803 m)   Wt 230 lb (104.3 kg)   BMI 32.08 kg/m      06/16/2020   11:32 AM 06/30/2020   11:19 AM  [facility] Adult Exercise  Frequency of aerobic exercise (# of days/week) 2 2  Average time in minutes 20 30  Frequency of strengthening activities (# of days/week) 0 1        No data to display              Objective:  Physical Exam:  Gen: NAD, comfortable in exam room  Right knee: Mild effusion.  No other gross deformity, ecchymoses. Minimal TTP medial joint line, medial patellar facet. FROM with normal strength. Negative ant/post drawers. Negative valgus/varus testing. Negative lachman.  Negative mcmurrays, apleys.  Negative apprehension. NV intact distally.   Assessment & Plan:  1. Right knee injury - 2/2 subluxation vs dislocation with spontaneous reduction on 7/29.  He is doing as expected.  Patient was  reassured about current swelling, progress.  Icing, elevation, brace.  Home quad strengthening.  F/u in 1 week.

## 2022-06-04 ENCOUNTER — Encounter: Payer: Self-pay | Admitting: Diagnostic Neuroimaging

## 2022-06-04 ENCOUNTER — Telehealth (INDEPENDENT_AMBULATORY_CARE_PROVIDER_SITE_OTHER): Payer: Medicare Other | Admitting: Diagnostic Neuroimaging

## 2022-06-04 DIAGNOSIS — G40309 Generalized idiopathic epilepsy and epileptic syndromes, not intractable, without status epilepticus: Secondary | ICD-10-CM | POA: Diagnosis not present

## 2022-06-04 MED ORDER — ETHOSUXIMIDE 250 MG PO CAPS: 500.0000 mg | ORAL_CAPSULE | Freq: Two times a day (BID) | ORAL | 4 refills | Status: DC

## 2022-06-04 MED ORDER — LAMICTAL 200 MG PO TABS: ORAL_TABLET | ORAL | 4 refills | Status: DC

## 2022-06-04 NOTE — Progress Notes (Signed)
Chief Complaint  Patient presents with   Seizures     History of Present Illness:  UPDATE (06/04/22, VRP): Since last visit, doing well. Symptoms are stable. No seizures.Tolerating meds.    UPDATE (05/31/21, VRP): Since last visit, doing well. Symptoms are stable. No alleviating or aggravating factors. Tolerating meds. No major seizures. Very rare staring spells.  UPDATE (04/04/20, VRP): Since last visit, doing well until 03/20/2020 1 patient had unprovoked seizure.  Patient was at sister's house, in the car when all of a sudden he made a clicking sound.  Then he had stiffness and generalized convulsions.  Afterwards he was very confused and combative.  Paramedics were called patient went to the emergency room for evaluation.  CT head unremarkable.  Labs unremarkable except for hematuria and elevated creatinine.  Since then patient has returned to baseline.  No further events.  He continues to have a few daily staring spells like in the past.  He is tolerating his medications.  There has been concern about possible chronic dehydration.   UPDATE (02/16/19, VRP):  - continues with daily staring spells (3-4 days per weeks; 5-7 spells per day) - had amb EEG at [facility]; some discharges noted; no clinical events; no subclincal seizures - overall stable - not driving currently; does not plan to drive in near future  UPDATE (05/26/18, VRP): Since last visit, doing about the same. Symptoms are stable. Severity is moderate. No alleviating or aggravating factors. Tolerating anti-seizure meds. Avg ~10 staring spells per day. Not clear if these are daydreaming or petit mal sz.    PRIOR HPI (07/08/17): 31 year old right-handed male here for evaluation of seizure disorder.   Patient had normal birth and development. At age 48 years old he had intermittent episodes of seizures. Patient would have staring spells as well as grand mal seizures. He would have intermittent muscle jerks. Over the years he was treated  with Lamictal and this dose was increased over time. Last grand mal seizure was run 2014.   He was then developing continued problems with intermittent daily staring spells. Therefore he was referred to [facility] video EEG for evaluation in January 2015. During the admission his Lamictal was decreased from 600 mg twice a day down to 300 mg twice a day. Patient had several stereotypical events which were confirmed to be correlated with electrographic seizure activity. He was discharged on Lamictal 400 mg twice a day with ethosuxamide '250mg'$  twice a day. Patient was tried on Keppra but they could not tolerate side effects. Modified Atkins diet was considered. VNS was considered. In 2016 Dr. Ginny Forth raised possibility of trying Depakote to help with better seizure control, but patient and family declined due to potential side effects.   Patient also had home sleep study which ruled out sleep apnea.   Patient had a ambulatory EEG in November 2017 which showed generalized spike and wave discharges. Patient had one staring spell without EEG correlate. Therefore it was concluded that patient staring spells may not be related to epileptic discharges. Patient last seen at [facility] epilepsy clinic in April 2018.   Now patient presenting here to establish with local neurologist.   Family is concerned about his ongoing daily staring spells, tremors, joint pain, difficulty in working and living independently.    Observations/Objective:  Video visit   Assessment and Plan:  31 y.o. male here with history of seizures since age 24 years old, likely primary generalized epilepsy. Patient has had intermittent grand mal seizures, last occurring  in 2016. Now with ongoing intermittent daily spells, decreased attention, decreased processing speed. These were raised as possibly nonepileptic spells.   Last grand mal seizures: Nov 2016, May 2021   Daily staring spells.   Meds tried: lamictal, zarontin, keppra      Dx:   1. 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    Dispense:  360 capsule    Refill:  4   LAMICTAL 200 MG tablet    Sig: Take 2 tablets (400 mg total) by mouth every morning AND 3.5 tablets (700 mg total) every evening.    Dispense:  500 tablet    Refill:  4    Follow Up Instructions:  - Return in about 1 year (around 06/05/2023) for MyChart visit (15 min), with NP Butler Denmark).   Virtual Visit via Video Note  I connected with [name] on 06/04/22 at  1:30 PM EDT by a video enabled telemedicine application and verified that I am speaking with the correct person using two identifiers.   I discussed the limitations of evaluation and management by telemedicine and the availability of in person appointments. The patient expressed understanding and agreed to proceed.  Patient is at home and I am at the office.   I spent 15 minutes of face-to-face and non-face-to-face time with patient.  This included previsit chart review, lab review, study review, order entry, electronic health record documentation, patient education.      Penni Bombard, MD 9/81/1914, 7:82 PM Certified in Neurology, Neurophysiology and Neuroimaging  [facility] [address] [place], [place] [address] [phone]

## 2022-06-06 ENCOUNTER — Telehealth: Payer: Medicare Other | Admitting: Diagnostic Neuroimaging

## 2022-06-06 ENCOUNTER — Ambulatory Visit (INDEPENDENT_AMBULATORY_CARE_PROVIDER_SITE_OTHER): Payer: Medicare Other | Admitting: Family Medicine

## 2022-06-06 VITALS — BP 125/75 | Ht 71.0 in | Wt 230.0 lb

## 2022-06-06 DIAGNOSIS — M25561 Pain in right knee: Secondary | ICD-10-CM | POA: Diagnosis not present

## 2022-06-06 NOTE — Progress Notes (Signed)
PCP: Biagio Borg, MD  Subjective:   HPI: Patient is a 31 y.o. male here for right knee swelling.  7/31: Patient was on a ladder on Saturday when he twisted wrong and felt his knee "shift and pop." His pain is primarily on the medial side of his knee. He has noticed some significant swelling though no bruising. His knee does not click or lock while walking. He does have a limp due to the pain. He has been icing and elevating his leg. He has taken ibuprofen and Tylenol occasionally.  Has history of right knee surgery in past - ? MPFL repair, ACL reconstruction.  8/9: Patient returns with persistent right knee swelling. Wants to see if something needs to be done for this or if this is normal at this stage. He's wearing his knee brace when up and walking around. Swelling is improved currently. Pain he experiences is medial. He has follow up scheduled in 1 week.  8/16: Patient reports he continues to slowly improve. Knee not as swollen as previously. Wearing knee brace.  Past Medical History:  Diagnosis Date   Anxiety    Seizures ([place])    epilepsy since age 82yr, sz 03/20/20   SINUSITIS- ACUTE-NOS 09/25/2010   TESTICULAR MASS 11/09/2009   UTI 11/09/2009    Current Outpatient Medications on File Prior to Visit  Medication Sig Dispense Refill   EPINEPHrine (EPIPEN) 0.3 mg/0.3 mL IJ SOAJ injection Inject 0.3 mLs (0.3 mg total) into the muscle once. 2 Device 1   ethosuximide (ZARONTIN) 250 MG capsule Take 2 capsules (500 mg total) by mouth 2 (two) times daily. 360 capsule 4   LAMICTAL 200 MG tablet Take 2 tablets (400 mg total) by mouth every morning AND 3.5 tablets (700 mg total) every evening. 500 tablet 4   No current facility-administered medications on file prior to visit.    Past Surgical History:  Procedure Laterality Date   ANKLE ARTHROSCOPY Left    DG THUMB RIGHT HAND ([facility])     2-3 yrs ago 2015 or 2016   Knee surgury  03/2006   Right knee - meniscus tear/minor ACL  tear   LIGAMENT REPAIR Right 05/07/2013   Procedure: cheilectomy, microfracture of metacarpal head surface right thumb;  Surgeon: RCammie Sickle, MD;  [facility]  Service: Orthopedics;  Laterality: Right;   MYRINGOTOMY WITH TUBE PLACEMENT     as child   TOE SURGERY Right     Allergies  Allergen Reactions   Bee Venom     angioedema    BP 125/75   Ht '5\' 11"'$  (1.803 m)   Wt 230 lb (104.3 kg)   BMI 32.08 kg/m      06/16/2020   11:32 AM 06/30/2020   11:19 AM  [facility]  Frequency of aerobic exercise (# of days/week) 2 2  Average time in minutes 20 30  Frequency of strengthening activities (# of days/week) 0 1        No data to display              Objective:  Physical Exam:  Gen: NAD, comfortable in exam room  Right knee: Minimal effusion.  No other gross deformity, ecchymoses. Minimal TTP medial joint line, medial patellar facet. FROM with normal strength. Negative ant/post drawers. Negative valgus/varus testing. Negative lachman.  Negative mcmurrays, apleys.  Negative apprehension. NV intact distally.   Assessment & Plan:  1. Right knee injury - 2/2 subluxation  vs dislocation with spontaneous reduction on 7/29.  Continues to slowly improve.  Continue knee brace.  Start formal physical therapy.  Icing, elevation.  F/u in 4 weeks.

## 2022-06-06 NOTE — Patient Instructions (Signed)
We will send you to formal physical therapy to begin your rehabilitation process, they will transition you to home exercises when ready.  Continue to wear your hinged knee brace when you have prolonged standing, to protect from twisting activities.  You may continue to ice as you may notice some swelling.  You may also use your Tylenol as needed for pain.  Follow-up in 4 weeks.

## 2022-06-20 ENCOUNTER — Ambulatory Visit: Payer: Medicare Other | Attending: Family Medicine

## 2022-06-20 DIAGNOSIS — M25561 Pain in right knee: Secondary | ICD-10-CM | POA: Diagnosis not present

## 2022-06-20 DIAGNOSIS — R2681 Unsteadiness on feet: Secondary | ICD-10-CM | POA: Diagnosis not present

## 2022-06-20 DIAGNOSIS — M6281 Muscle weakness (generalized): Secondary | ICD-10-CM | POA: Insufficient documentation

## 2022-06-20 NOTE — Therapy (Signed)
OUTPATIENT PHYSICAL THERAPY LOWER EXTREMITY EVALUATION   Patient Name: Christopher Reed MRN: [id number] DOB:08-24-1991, 31 y.o., male Today's Date: 06/20/2022   PT End of Session - 06/20/22 1025     Visit Number 1    Number of Visits 17    Date for PT Re-Evaluation 08/15/22    Authorization Type [employer] Medicare    PT Start Time 0955    PT Stop Time 1025    PT Time Calculation (min) 30 min    Activity Tolerance Patient tolerated treatment well    Behavior During Therapy [facility] for tasks assessed/performed             Past Medical History:  Diagnosis Date   Anxiety    Seizures ([place])    epilepsy since age 59yr, sz 03/20/20   SINUSITIS- ACUTE-NOS 09/25/2010   TESTICULAR MASS 11/09/2009   UTI 11/09/2009   Past Surgical History:  Procedure Laterality Date   ANKLE ARTHROSCOPY Left    DG THUMB RIGHT HAND ([place])     2-3 yrs ago 2015 or 2016   Knee surgury  03/2006   Right knee - meniscus tear/minor ACL tear   LIGAMENT REPAIR Right 05/07/2013   Procedure: cheilectomy, microfracture of metacarpal head surface right thumb;  Surgeon: RCammie Sickle, MD;  [place] [facility]  Service: Orthopedics;  Laterality: Right;   MYRINGOTOMY WITH TUBE PLACEMENT     as child   TOE SURGERY Right    Patient Active Problem List   Diagnosis Date Noted   Vitamin D deficiency 08/11/2021   GERD (gastroesophageal reflux disease) 08/11/2021   Obesity 08/11/2021   COVID-19 virus infection 06/26/2021   Hand swelling 03/28/2021   Ganglion cyst of finger 06/16/2020   Abnormal urinalysis 04/06/2020   Renal insufficiency 04/05/2020   Hyperglycemia 04/05/2020   Protein in urine 06/15/2016   Angioedema 06/10/2014   Generalized convulsive epilepsy without mention of intractable epilepsy 03/16/2013   Generalized nonconvulsive epilepsy without mention of intractable epilepsy 03/16/2013   Transient alteration of awareness 03/16/2013   Essential and other specified forms of tremor  03/16/2013   Encounter for long-term (current) use of other medications 03/16/2013   Head injury, unspecified 03/16/2013   Other malaise and fatigue 03/16/2013   Anxiety    Encounter for well adult exam with abnormal findings 10/27/2011    PCP: JBiagio Borg MD   REFERRING PROVIDER:  HDene Gentry MD  REFERRING DIAG: [phone](ICD-10-CM) - Right knee pain, unspecified chronicity  THERAPY DIAG:  Acute pain of right knee - Plan: PT plan of care cert/re-cert  Muscle weakness (generalized) - Plan: PT plan of care cert/re-cert  Unsteadiness on feet - Plan: PT plan of care cert/re-cert  Rationale for Evaluation and Treatment Rehabilitation  ONSET DATE: 05/19/2022  SUBJECTIVE:   SUBJECTIVE STATEMENT: Pt presents to PT s/p R patellar dislocation and twisting injury while on a ladder in late July 2023. Has been in a hinged brace for last few weeks, notes pain has slowly been improving. Denies paresthesias in LE, has felt limited in higher level activities secondary to pain.   PERTINENT HISTORY: Anxiety, Seizures, Ankle/Knee and Thumb surgeries    PAIN:  Are you having pain?  Yes: NPRS scale: 5/10 - (5/10 at worst) Pain location: R medial knee Pain description: sharp, pulsating Aggravating factors: standing Relieving factors: rest  PRECAUTIONS: None  WEIGHT BEARING RESTRICTIONS No  FALLS:  Has patient fallen in last 6 months? Yes: Number of falls -  one; patellar dislocation on ladder  LIVING ENVIRONMENT: Lives with: lives with their family Lives in: House/apartment Stairs: No Has following equipment at home:  hinged knee brace  OCCUPATION: works at coffee [employer]  PLOF: Independent and Independent with basic ADLs  PATIENT GOALS: decrease R knee pain   OBJECTIVE:   DIAGNOSTIC FINDINGS:   See Imagining for recent MRI  PATIENT SURVEYS:  FOTO: 65% function; 82% predicted  COGNITION:  Overall cognitive status: Within functional limits for tasks  assessed     SENSATION: WFL  POSTURE:  medium body habitus; mild medial knee edema  PALPATION: Slight TTP to R medial knee joint line  LOWER EXTREMITY ROM:  Active ROM Right eval Left eval  Hip flexion    Hip extension    Hip abduction    Hip adduction    Hip internal rotation    Hip external rotation    Knee flexion 120   Knee extension 0   Ankle dorsiflexion    Ankle plantarflexion    Ankle inversion    Ankle eversion     (Blank rows = not tested)  LOWER EXTREMITY MMT:  MMT Right eval Left eval  Hip flexion 5/5 5/5  Hip extension    Hip abduction 4/5 5/5  Hip adduction    Hip internal rotation    Hip external rotation    Knee flexion 4/5 5/5  Knee extension 4/5 5/5  Ankle dorsiflexion    Ankle plantarflexion    Ankle inversion    Ankle eversion     (Blank rows = not tested)  LOWER EXTREMITY SPECIAL TESTS:  DNT  FUNCTIONAL TESTS:  SLS: unable   GAIT: Distance walked: 44f Assistive device utilized: None Level of assistance: Complete Independence Comments: antaglic gait R, decreased knee flexion R    TODAY'S TREATMENT: [facility] Adult PT Treatment:                                                DATE: 06/20/2022 Therapeutic Exercise: Quad sets x 10 - 5" hold Supine SLR x 10 R  S/L hip abd x 10 R Seated LAQ x 10 - 5" hold R  PATIENT EDUCATION:  Education details: eval findings, FOTO, HEP, POC Person educated: Patient Education method: Explanation, Demonstration, and Handouts Education comprehension: verbalized understanding and returned demonstration   HOME EXERCISE PROGRAM: Access Code: [email or web address]: [email or web address] Date: 06/20/2022 Prepared by: DOctavio Manns Exercises - Supine Quadricep Sets  - 2 x daily - 7 x weekly - 3 sets - 10 reps - 5 sec hold - Active Straight Leg Raise with Quad Set  - 2 x daily - 7 x weekly - 3 sets - 10 reps - Sidelying Hip Abduction  - 2 x daily - 7 x weekly - 3 sets - 10 reps - Seated  Long Arc Quad  - 2 x daily - 7 x weekly - 3 sets - 10 reps - 5 sec hold  ASSESSMENT:  CLINICAL IMPRESSION: Patient is a 31y.o. M who was seen today for physical therapy evaluation and treatment s/p R patellar dislocation and twisting injury while on a ladder in late July 2023. Physical findings are consistent with physician impression and MRI findings, with medial knee pain and swelling as well as decrease in R LE strength. Pt demonstrated good quad contraction and R knee ROM today at evaluation,  with initial HEP focused on honing and improving quad strength. His FOTO score demonstrates decrease in function ability below PLOF, indicating he would be a good candidate for skilled PT services working on improving strength and knee stability post injury.    OBJECTIVE IMPAIRMENTS Abnormal gait, decreased activity tolerance, decreased balance, decreased strength, and pain   ACTIVITY LIMITATIONS carrying, lifting, standing, squatting, and stairs  PARTICIPATION LIMITATIONS: community activity, occupation, and yard work  PERSONAL FACTORS Past/current experiences and 1-2 comorbidities: Anxiety, Seizures, Ankle/Knee and Thumb surgeries  are also affecting patient's functional outcome.   REHAB POTENTIAL: Excellent  CLINICAL DECISION MAKING: Stable/uncomplicated  EVALUATION COMPLEXITY: Low   GOALS: Goals reviewed with patient? No  SHORT TERM GOALS: Target date: 07/11/2022  Pt will be compliant and knowledgeable with initial HEP for improved comfort and carryover Baseline: initial HEP given  Goal status: INITIAL  2.  Pt will self report R knee pain no greater than 3/10 for improved comfort and functional ability Baseline: 5/10 at worst Goal status: INITIAL   LONG TERM GOALS: Target date: 08/15/2022   Pt will self report R knee pain no greater than 1/10 for improved comfort and functional ability Baseline: 5/10 at worst Goal status: INITIAL  2.  Pt will improve FOTO function score to no  less than 82% as proxy for functional improvement Baseline: 65% function Goal status: INITIAL   3.  Pt will be able to hold SLS on R LE for no less than 60" for improved balance and functional mobility Baseline: unable  Goal status: INITIAL   4.  Pt will be able to ambulate up/down 15 steps with reciprocal gait and no increase in knee pain for improved mobility and community navigation Baseline: unable Goal status: INITIAL  5.  Pt will increase 30 Second Sit to Stand rep count by MDIC (2 reps) for improved balance, strength, and functional mobility Baseline: will assess next session Goal status: INITIAL    PLAN: PT FREQUENCY: 2x/week  PT DURATION: 8 weeks  PLANNED INTERVENTIONS: Therapeutic exercises, Therapeutic activity, Neuromuscular re-education, Balance training, Gait training, Patient/Family education, Self Care, Joint mobilization, Aquatic Therapy, Dry Needling, Electrical stimulation, Cryotherapy, Moist heat, Vasopneumatic device, Manual therapy, and Re-evaluation  PLAN FOR NEXT SESSION: 30 Second STS, assess HEP response, progress quad and proximal hip strength   Ward Chatters, PT 06/20/2022, 10:36 AM

## 2022-06-27 ENCOUNTER — Ambulatory Visit: Payer: Medicare Other | Attending: Family Medicine

## 2022-06-27 DIAGNOSIS — M25561 Pain in right knee: Secondary | ICD-10-CM | POA: Insufficient documentation

## 2022-06-27 DIAGNOSIS — R2681 Unsteadiness on feet: Secondary | ICD-10-CM | POA: Insufficient documentation

## 2022-06-27 DIAGNOSIS — M6281 Muscle weakness (generalized): Secondary | ICD-10-CM | POA: Diagnosis not present

## 2022-06-27 NOTE — Therapy (Signed)
OUTPATIENT PHYSICAL THERAPY TREATMENT NOTE   Patient Name: Christopher Reed MRN: [id number] DOB:11-27-1990, 31 y.o., male Today's Date: 06/28/2022  PCP: Biagio Borg, MD  REFERRING PROVIDER: Dene Gentry, MD  END OF SESSION:   PT End of Session - 06/27/22 1827     Visit Number 2    Number of Visits 17    Date for PT Re-Evaluation 08/15/22    Authorization Type [employer] Medicare    PT Start Time 3419    PT Stop Time 1910    PT Time Calculation (min) 40 min    Activity Tolerance Patient tolerated treatment well    Behavior During Therapy WFL for tasks assessed/performed             Past Medical History:  Diagnosis Date   Anxiety    Seizures ([place])    epilepsy since age 55yr, sz 03/20/20   SINUSITIS- ACUTE-NOS 09/25/2010   TESTICULAR MASS 11/09/2009   UTI 11/09/2009   Past Surgical History:  Procedure Laterality Date   ANKLE ARTHROSCOPY Left    DG THUMB RIGHT HAND ([facility])     2-3 yrs ago 2015 or 2016   Knee surgury  03/2006   Right knee - meniscus tear/minor ACL tear   LIGAMENT REPAIR Right 05/07/2013   Procedure: cheilectomy, microfracture of metacarpal head surface right thumb;  Surgeon: RCammie Sickle, MD;  [place] [facility] [place]  Service: Orthopedics;  Laterality: Right;   MYRINGOTOMY WITH TUBE PLACEMENT     as child   TOE SURGERY Right    Patient Active Problem List   Diagnosis Date Noted   Vitamin D deficiency 08/11/2021   GERD (gastroesophageal reflux disease) 08/11/2021   Obesity 08/11/2021   COVID-19 virus infection 06/26/2021   Hand swelling 03/28/2021   Ganglion cyst of finger 06/16/2020   Abnormal urinalysis 04/06/2020   Renal insufficiency 04/05/2020   Hyperglycemia 04/05/2020   Protein in urine 06/15/2016   Angioedema 06/10/2014   Generalized convulsive epilepsy without mention of intractable epilepsy 03/16/2013   Generalized nonconvulsive epilepsy without mention of intractable epilepsy 03/16/2013   Transient alteration of  awareness 03/16/2013   Essential and other specified forms of tremor 03/16/2013   Encounter for long-term (current) use of other medications 03/16/2013   Head injury, unspecified 03/16/2013   Other malaise and fatigue 03/16/2013   Anxiety    Encounter for well adult exam with abnormal findings 10/27/2011    REFERRING DIAG: M25.561 (ICD-10-CM) - Right knee pain, unspecified chronicity  THERAPY DIAG:  Acute pain of right knee  Muscle weakness (generalized)  Unsteadiness on feet  Rationale for Evaluation and Treatment Rehabilitation  PERTINENT HISTORY: Anxiety, Seizures, Ankle/Knee and Thumb surgeries    PRECAUTIONS: None  SUBJECTIVE: Pt presents to PT with no current reports of pain or discomfort. Has been compliant with HEP with no adverse effect. Pt is ready to begin PT at this time.   PAIN:  Are you having pain?  Yes: NPRS scale: 5/10 - (5/10 at worst) Pain location: R medial knee Pain description: sharp, pulsating Aggravating factors: standing Relieving factors: rest   OBJECTIVE: (objective measures completed at initial evaluation unless otherwise dated)  DIAGNOSTIC FINDINGS:            See Imagining for recent MRI   PATIENT SURVEYS:  FOTO: 65% function; 82% predicted   COGNITION:           Overall cognitive status: Within functional limits for tasks assessed  SENSATION: WFL   POSTURE:  medium body habitus; mild medial knee edema   PALPATION: Slight TTP to R medial knee joint line   LOWER EXTREMITY ROM:   Active ROM Right eval Left eval  Hip flexion      Hip extension      Hip abduction      Hip adduction      Hip internal rotation      Hip external rotation      Knee flexion 120    Knee extension 0    Ankle dorsiflexion      Ankle plantarflexion      Ankle inversion      Ankle eversion       (Blank rows = not tested)   LOWER EXTREMITY MMT:   MMT Right eval Left eval  Hip flexion 5/5 5/5  Hip extension      Hip  abduction 4/5 5/5  Hip adduction      Hip internal rotation      Hip external rotation      Knee flexion 4/5 5/5  Knee extension 4/5 5/5  Ankle dorsiflexion      Ankle plantarflexion      Ankle inversion      Ankle eversion       (Blank rows = not tested)   LOWER EXTREMITY SPECIAL TESTS:  DNT   FUNCTIONAL TESTS:  SLS: unable    GAIT: Distance walked: 35f Assistive device utilized: None Level of assistance: Complete Independence Comments: antaglic gait R, decreased knee flexion R       TODAY'S TREATMENT: [facility] Adult PT Treatment:                                                DATE: 06/27/2022 Therapeutic Exercise: Rec bike lvl 3 x 3 min while taking subjective SLR 3x15  S/L clamshell 2x10 GTB Bridge 3x10  Standing hip abd 2x10 25# LAQ 2x15 5# - 5" hold  STS 2x10 Seated hamstring curl 35# 3x10 Wall squat 2x10  [facility] Adult PT Treatment:                                                DATE: 06/20/2022 Therapeutic Exercise: Quad sets x 10 - 5" hold Supine SLR x 10 R  S/L hip abd x 10 R Seated LAQ x 10 - 5" hold R   PATIENT EDUCATION:  Education details: eval findings, FOTO, HEP, POC Person educated: Patient Education method: Explanation, Demonstration, and Handouts Education comprehension: verbalized understanding and returned demonstration     HOME EXERCISE PROGRAM: Access Code: [email or web address]: [email or web address] Date: 06/20/2022 Prepared by: DOctavio Manns  Exercises - Supine Quadricep Sets  - 2 x daily - 7 x weekly - 3 sets - 10 reps - 5 sec hold - Active Straight Leg Raise with Quad Set  - 2 x daily - 7 x weekly - 3 sets - 10 reps - Sidelying Hip Abduction  - 2 x daily - 7 x weekly - 3 sets - 10 reps - Seated Long Arc Quad  - 2 x daily - 7 x weekly - 3 sets - 10 reps - 5 sec hold   ASSESSMENT:   CLINICAL IMPRESSION: Pt responded  well to therapy, with emphasis on improving quad and proximal hip strength. Is progressing as expected thus far.  Will continue per POC as prescribed.      OBJECTIVE IMPAIRMENTS Abnormal gait, decreased activity tolerance, decreased balance, decreased strength, and pain    ACTIVITY LIMITATIONS carrying, lifting, standing, squatting, and stairs   PARTICIPATION LIMITATIONS: community activity, occupation, and yard work   PERSONAL FACTORS Past/current experiences and 1-2 comorbidities: Anxiety, Seizures, Ankle/Knee and Thumb surgeries  are also affecting patient's functional outcome.    REHAB POTENTIAL: Excellent   CLINICAL DECISION MAKING: Stable/uncomplicated   EVALUATION COMPLEXITY: Low     GOALS: Goals reviewed with patient? No   SHORT TERM GOALS: Target date: 07/11/2022  Pt will be compliant and knowledgeable with initial HEP for improved comfort and carryover Baseline: initial HEP given  Goal status: INITIAL   2.  Pt will self report R knee pain no greater than 3/10 for improved comfort and functional ability Baseline: 5/10 at worst Goal status: INITIAL    LONG TERM GOALS: Target date: 08/15/2022    Pt will self report R knee pain no greater than 1/10 for improved comfort and functional ability Baseline: 5/10 at worst Goal status: INITIAL   2.  Pt will improve FOTO function score to no less than 82% as proxy for functional improvement Baseline: 65% function Goal status: INITIAL    3.  Pt will be able to hold SLS on R LE for no less than 60" for improved balance and functional mobility Baseline: unable  Goal status: INITIAL    4.  Pt will be able to ambulate up/down 15 steps with reciprocal gait and no increase in knee pain for improved mobility and community navigation Baseline: unable Goal status: INITIAL   5.  Pt will increase 30 Second Sit to Stand rep count by MDIC (2 reps) for improved balance, strength, and functional mobility Baseline: will assess next session Goal status: INITIAL      PLAN: PT FREQUENCY: 2x/week   PT DURATION: 8 weeks   PLANNED INTERVENTIONS:  Therapeutic exercises, Therapeutic activity, Neuromuscular re-education, Balance training, Gait training, Patient/Family education, Self Care, Joint mobilization, Aquatic Therapy, Dry Needling, Electrical stimulation, Cryotherapy, Moist heat, Vasopneumatic device, Manual therapy, and Re-evaluation   PLAN FOR NEXT SESSION: 30 Second STS, assess HEP response, progress quad and proximal hip strength    Ward Chatters, PT 06/28/2022, 8:54 AM

## 2022-06-28 ENCOUNTER — Ambulatory Visit: Payer: Medicare Other

## 2022-06-28 DIAGNOSIS — M6281 Muscle weakness (generalized): Secondary | ICD-10-CM

## 2022-06-28 DIAGNOSIS — R2681 Unsteadiness on feet: Secondary | ICD-10-CM

## 2022-06-28 DIAGNOSIS — M25561 Pain in right knee: Secondary | ICD-10-CM | POA: Diagnosis not present

## 2022-06-28 NOTE — Therapy (Signed)
OUTPATIENT PHYSICAL THERAPY TREATMENT NOTE   Patient Name: Christopher Reed MRN: [id number] DOB:08-Mar-1991, 31 y.o., male Today's Date: 06/28/2022  PCP: Biagio Borg, MD  REFERRING PROVIDER: Dene Gentry, MD  END OF SESSION:   PT End of Session - 06/28/22 1613     Visit Number 3    Number of Visits 17    Date for PT Re-Evaluation 08/15/22    Authorization Type [employer] Medicare    PT Start Time 3790    PT Stop Time 1654    PT Time Calculation (min) 39 min    Activity Tolerance Patient tolerated treatment well    Behavior During Therapy WFL for tasks assessed/performed              Past Medical History:  Diagnosis Date   Anxiety    Seizures ([place])    epilepsy since age 66yr, sz 03/20/20   SINUSITIS- ACUTE-NOS 09/25/2010   TESTICULAR MASS 11/09/2009   UTI 11/09/2009   Past Surgical History:  Procedure Laterality Date   ANKLE ARTHROSCOPY Left    DG THUMB RIGHT HAND ([facility])     2-3 yrs ago 2015 or 2016   Knee surgury  03/2006   Right knee - meniscus tear/minor ACL tear   LIGAMENT REPAIR Right 05/07/2013   Procedure: cheilectomy, microfracture of metacarpal head surface right thumb;  Surgeon: RCammie Sickle, MD;  [place] [facility] [place]  Service: Orthopedics;  Laterality: Right;   MYRINGOTOMY WITH TUBE PLACEMENT     as child   TOE SURGERY Right    Patient Active Problem List   Diagnosis Date Noted   Vitamin D deficiency 08/11/2021   GERD (gastroesophageal reflux disease) 08/11/2021   Obesity 08/11/2021   COVID-19 virus infection 06/26/2021   Hand swelling 03/28/2021   Ganglion cyst of finger 06/16/2020   Abnormal urinalysis 04/06/2020   Renal insufficiency 04/05/2020   Hyperglycemia 04/05/2020   Protein in urine 06/15/2016   Angioedema 06/10/2014   Generalized convulsive epilepsy without mention of intractable epilepsy 03/16/2013   Generalized nonconvulsive epilepsy without mention of intractable epilepsy 03/16/2013   Transient alteration  of awareness 03/16/2013   Essential and other specified forms of tremor 03/16/2013   Encounter for long-term (current) use of other medications 03/16/2013   Head injury, unspecified 03/16/2013   Other malaise and fatigue 03/16/2013   Anxiety    Encounter for well adult exam with abnormal findings 10/27/2011    REFERRING DIAG: M25.561 (ICD-10-CM) - Right knee pain, unspecified chronicity  THERAPY DIAG:  Acute pain of right knee  Muscle weakness (generalized)  Unsteadiness on feet  Rationale for Evaluation and Treatment Rehabilitation  PERTINENT HISTORY: Anxiety, Seizures, Ankle/Knee and Thumb surgeries    PRECAUTIONS: None  SUBJECTIVE: Pt presents to PT with no current knee pain or discomfort. Responded well to last treatment session. Is ready to begin PT at this time.  PAIN:  Are you having pain?  No: NPRS scale: 0/10 - (5/10 at worst) Pain location: R medial knee Pain description: sharp, pulsating Aggravating factors: standing Relieving factors: rest   OBJECTIVE: (objective measures completed at initial evaluation unless otherwise dated)  DIAGNOSTIC FINDINGS:            See Imagining for recent MRI   PATIENT SURVEYS:  FOTO: 65% function; 82% predicted   COGNITION:           Overall cognitive status: Within functional limits for tasks assessed  SENSATION: WFL   POSTURE:  medium body habitus; mild medial knee edema   PALPATION: Slight TTP to R medial knee joint line   LOWER EXTREMITY ROM:   Active ROM Right eval Left eval  Hip flexion      Hip extension      Hip abduction      Hip adduction      Hip internal rotation      Hip external rotation      Knee flexion 120    Knee extension 0    Ankle dorsiflexion      Ankle plantarflexion      Ankle inversion      Ankle eversion       (Blank rows = not tested)   LOWER EXTREMITY MMT:   MMT Right eval Left eval  Hip flexion 5/5 5/5  Hip extension      Hip abduction 4/5 5/5   Hip adduction      Hip internal rotation      Hip external rotation      Knee flexion 4/5 5/5  Knee extension 4/5 5/5  Ankle dorsiflexion      Ankle plantarflexion      Ankle inversion      Ankle eversion       (Blank rows = not tested)   LOWER EXTREMITY SPECIAL TESTS:  DNT   FUNCTIONAL TESTS:  SLS: unable    GAIT: Distance walked: 50f Assistive device utilized: None Level of assistance: Complete Independence Comments: antaglic gait R, decreased knee flexion R       TODAY'S TREATMENT: [facility] Adult PT Treatment:                                                DATE: 06/28/2022 Therapeutic Exercise: Rec bike lvl 3 x 3 min while taking subjective Seated knee ext 2x10 10# R only Seated hamstring curl 45# 2x10 SLR 3x10 2.5#  S/L clamshell 2x15 GTB Bridge 3x15  Standing hip abd/ext 2x10 25# LAQ 2x15 5# - 5" hold  STS 2x10 Wall squat 2x10  [facility] Adult PT Treatment:                                                DATE: 06/27/2022 Therapeutic Exercise: Rec bike lvl 3 x 3 min while taking subjective SLR 3x15  S/L clamshell 2x10 GTB Bridge 3x10  Standing hip abd 2x10 25# LAQ 2x15 5# - 5" hold  STS 2x10 Seated hamstring curl 35# 3x10 Wall squat 2x10  [facility] Adult PT Treatment:                                                DATE: 06/20/2022 Therapeutic Exercise: Quad sets x 10 - 5" hold Supine SLR x 10 R  S/L hip abd x 10 R Seated LAQ x 10 - 5" hold R   PATIENT EDUCATION:  Education details: eval findings, FOTO, HEP, POC Person educated: Patient Education method: Explanation, Demonstration, and Handouts Education comprehension: verbalized understanding and returned demonstration     HOME EXERCISE PROGRAM: Access Code: [email or web address]: [email or web address] Date:  06/20/2022 Prepared by: Octavio Manns   Exercises - Supine Quadricep Sets  - 2 x daily - 7 x weekly - 3 sets - 10 reps - 5 sec hold - Active Straight Leg Raise with Quad Set  - 2 x daily - 7 x  weekly - 3 sets - 10 reps - Sidelying Hip Abduction  - 2 x daily - 7 x weekly - 3 sets - 10 reps - Seated Long Arc Quad  - 2 x daily - 7 x weekly - 3 sets - 10 reps - 5 sec hold   ASSESSMENT:   CLINICAL IMPRESSION: Pt was able to complete all prescribed exercises with no adverse effect or increase in pain. Therapy focused on improving LE strength in order to decrease pain and improve function post knee injury. He continues to benefit from skilled PT services and will continue to be seen and progressed as able per POC.      OBJECTIVE IMPAIRMENTS Abnormal gait, decreased activity tolerance, decreased balance, decreased strength, and pain    ACTIVITY LIMITATIONS carrying, lifting, standing, squatting, and stairs   PARTICIPATION LIMITATIONS: community activity, occupation, and yard work   PERSONAL FACTORS Past/current experiences and 1-2 comorbidities: Anxiety, Seizures, Ankle/Knee and Thumb surgeries  are also affecting patient's functional outcome.    REHAB POTENTIAL: Excellent   CLINICAL DECISION MAKING: Stable/uncomplicated   EVALUATION COMPLEXITY: Low     GOALS: Goals reviewed with patient? No   SHORT TERM GOALS: Target date: 07/11/2022  Pt will be compliant and knowledgeable with initial HEP for improved comfort and carryover Baseline: initial HEP given  Goal status: INITIAL   2.  Pt will self report R knee pain no greater than 3/10 for improved comfort and functional ability Baseline: 5/10 at worst Goal status: INITIAL    LONG TERM GOALS: Target date: 08/15/2022    Pt will self report R knee pain no greater than 1/10 for improved comfort and functional ability Baseline: 5/10 at worst Goal status: INITIAL   2.  Pt will improve FOTO function score to no less than 82% as proxy for functional improvement Baseline: 65% function Goal status: INITIAL    3.  Pt will be able to hold SLS on R LE for no less than 60" for improved balance and functional mobility Baseline: unable   Goal status: INITIAL    4.  Pt will be able to ambulate up/down 15 steps with reciprocal gait and no increase in knee pain for improved mobility and community navigation Baseline: unable Goal status: INITIAL   5.  Pt will increase 30 Second Sit to Stand rep count by MDIC (2 reps) for improved balance, strength, and functional mobility Baseline: will assess next session Goal status: INITIAL      PLAN: PT FREQUENCY: 2x/week   PT DURATION: 8 weeks   PLANNED INTERVENTIONS: Therapeutic exercises, Therapeutic activity, Neuromuscular re-education, Balance training, Gait training, Patient/Family education, Self Care, Joint mobilization, Aquatic Therapy, Dry Needling, Electrical stimulation, Cryotherapy, Moist heat, Vasopneumatic device, Manual therapy, and Re-evaluation   PLAN FOR NEXT SESSION: 30 Second STS, assess HEP response, progress quad and proximal hip strength    Ward Chatters, PT 06/28/2022, 4:54 PM

## 2022-06-29 ENCOUNTER — Ambulatory Visit (INDEPENDENT_AMBULATORY_CARE_PROVIDER_SITE_OTHER): Payer: Medicare Other

## 2022-06-29 DIAGNOSIS — Z23 Encounter for immunization: Secondary | ICD-10-CM | POA: Diagnosis not present

## 2022-07-03 ENCOUNTER — Ambulatory Visit: Payer: Medicare Other

## 2022-07-04 ENCOUNTER — Encounter: Payer: Self-pay | Admitting: Family Medicine

## 2022-07-04 ENCOUNTER — Ambulatory Visit (INDEPENDENT_AMBULATORY_CARE_PROVIDER_SITE_OTHER): Payer: Medicare Other | Admitting: Family Medicine

## 2022-07-04 ENCOUNTER — Ambulatory Visit: Payer: Medicare Other

## 2022-07-04 VITALS — BP 132/70 | Ht 71.0 in

## 2022-07-04 DIAGNOSIS — R2681 Unsteadiness on feet: Secondary | ICD-10-CM

## 2022-07-04 DIAGNOSIS — M25561 Pain in right knee: Secondary | ICD-10-CM

## 2022-07-04 DIAGNOSIS — S83004D Unspecified dislocation of right patella, subsequent encounter: Secondary | ICD-10-CM | POA: Diagnosis not present

## 2022-07-04 DIAGNOSIS — M6281 Muscle weakness (generalized): Secondary | ICD-10-CM

## 2022-07-04 DIAGNOSIS — S83004A Unspecified dislocation of right patella, initial encounter: Secondary | ICD-10-CM | POA: Insufficient documentation

## 2022-07-04 NOTE — Therapy (Signed)
OUTPATIENT PHYSICAL THERAPY TREATMENT NOTE   Patient Name: Christopher Reed MRN: [id number] DOB:[birth year], 31 y.o., male Today's Date: 07/04/2022  PCP: Biagio Borg, MD  REFERRING PROVIDER: Dene Gentry, MD  END OF SESSION:   PT End of Session - 07/04/22 1440     Visit Number 4    Number of Visits 17    Date for PT Re-Evaluation 08/15/22    Authorization Type [employer] Medicare    PT Start Time 7510    PT Stop Time 1525    PT Time Calculation (min) 40 min    Activity Tolerance Patient tolerated treatment well    Behavior During Therapy WFL for tasks assessed/performed               Past Medical History:  Diagnosis Date   Anxiety    Seizures ([place])    epilepsy since age 70yr, sz 03/20/20   SINUSITIS- ACUTE-NOS 09/25/2010   TESTICULAR MASS 11/09/2009   UTI 11/09/2009   Past Surgical History:  Procedure Laterality Date   ANKLE ARTHROSCOPY Left    [place] THUMB RIGHT [place] ([facility])     2-3 yrs ago 2015 or 2016   Knee surgury  03/2006   Right knee - meniscus tear/minor ACL tear   LIGAMENT REPAIR Right 05/07/2013   Procedure: cheilectomy, microfracture of metacarpal head surface right thumb;  Surgeon: RCammie Sickle, MD;  [facility] [facility]  Service: Orthopedics;  Laterality: Right;   MYRINGOTOMY WITH TUBE PLACEMENT     as child   TOE SURGERY Right    Patient Active Problem List   Diagnosis Date Noted   Dislocation of right patella 07/04/2022   Vitamin D deficiency 08/11/2021   GERD (gastroesophageal reflux disease) 08/11/2021   Obesity 08/11/2021   COVID-19 virus infection 06/26/2021   Hand swelling 03/28/2021   Ganglion cyst of finger 06/16/2020   Abnormal urinalysis 04/06/2020   Renal insufficiency 04/05/2020   Hyperglycemia 04/05/2020   Protein in urine 06/15/2016   Angioedema 06/10/2014   Generalized convulsive epilepsy without mention of intractable epilepsy 03/16/2013   Generalized nonconvulsive epilepsy without mention of intractable  epilepsy 03/16/2013   Transient alteration of awareness 03/16/2013   Essential and other specified forms of tremor 03/16/2013   Encounter for long-term (current) use of other medications 03/16/2013   Head injury, unspecified 03/16/2013   Other malaise and fatigue 03/16/2013   Anxiety    Encounter for well adult exam with abnormal findings 10/27/2011    REFERRING DIAG: M25.561 (ICD-10-CM) - Right knee pain, unspecified chronicity  THERAPY DIAG:  Acute pain of right knee  Muscle weakness (generalized)  Unsteadiness on feet  Rationale for Evaluation and Treatment Rehabilitation  PERTINENT HISTORY: Anxiety, Seizures, Ankle/Knee and Thumb surgeries    PRECAUTIONS: None  SUBJECTIVE: Pt presents to PT with reports of increased R medial knee pain. Notes that he was standing a lot yesterday and believes this was the issue. Pt has been compliant with HEP with no adverse effect. Pt is ready to begin PT at this time.   PAIN:  Are you having pain?  No: NPRS scale: 0/10 - (5/10 at worst) Pain location: R medial knee Pain description: sharp, pulsating Aggravating factors: standing Relieving factors: rest   OBJECTIVE: (objective measures completed at initial evaluation unless otherwise dated)  DIAGNOSTIC FINDINGS:            See Imagining for recent MRI   PATIENT SURVEYS:  FOTO: 65% function; 82% predicted  COGNITION:           Overall cognitive status: Within functional limits for tasks assessed                          SENSATION: WFL   POSTURE:  medium body habitus; mild medial knee edema   PALPATION: Slight TTP to R medial knee joint line   LOWER EXTREMITY ROM:   Active ROM Right eval Left eval  Hip flexion      Hip extension      Hip abduction      Hip adduction      Hip internal rotation      Hip external rotation      Knee flexion 120    Knee extension 0    Ankle dorsiflexion      Ankle plantarflexion      Ankle inversion      Ankle eversion        (Blank rows = not tested)   LOWER EXTREMITY MMT:   MMT Right eval Left eval  Hip flexion 5/5 5/5  Hip extension      Hip abduction 4/5 5/5  Hip adduction      Hip internal rotation      Hip external rotation      Knee flexion 4/5 5/5  Knee extension 4/5 5/5  Ankle dorsiflexion      Ankle plantarflexion      Ankle inversion      Ankle eversion       (Blank rows = not tested)   LOWER EXTREMITY SPECIAL TESTS:  DNT   FUNCTIONAL TESTS:  SLS: unable    GAIT: Distance walked: 82f Assistive device utilized: None Level of assistance: Complete Independence Comments: antaglic gait R, decreased knee flexion R       TODAY'S TREATMENT: [facility] Adult PT Treatment:                                                DATE: 07/04/2022 Therapeutic Exercise: Rec bike lvl 3 x 3 min while taking subjective Seated knee ext 2x15 10# R only Seated hamstring curl 45# 2x10 SLR 2x15 2.5#  S/L hip abd 2x10 2.5# S/L clamshell 2x15 GTB Standing hip abd/ext 2x10 25# LAQ 2x15 7.5# - 5" hold  SLS on foam 2x30" Wall squat 2x10 Eccentric heel tap 2x10 R  [facility] Adult PT Treatment:                                                DATE: 06/28/2022 Therapeutic Exercise: Rec bike lvl 3 x 3 min while taking subjective Seated knee ext 2x10 10# R only Seated hamstring curl 45# 2x10 SLR 3x10 2.5#  S/L clamshell 2x15 GTB Bridge 3x15  Standing hip abd/ext 2x10 25# LAQ 2x15 5# - 5" hold  STS 2x10 Wall squat 2x10  [facility] Adult PT Treatment:                                                DATE: 06/27/2022 Therapeutic Exercise: Rec bike lvl 3 x 3  min while taking subjective SLR 3x15  S/L clamshell 2x10 GTB Bridge 3x10  Standing hip abd 2x10 25# LAQ 2x15 5# - 5" hold  STS 2x10 Seated hamstring curl 35# 3x10 Wall squat 2x10   PATIENT EDUCATION:  Education details: update HEP Person educated: Patient Education method: Explanation, Demonstration, and Handouts Education comprehension: verbalized understanding  and returned demonstration     HOME EXERCISE PROGRAM: Access Code: [email or web address] URL: [email or web address] Date: 06/20/2022 Prepared by: Octavio Manns   Exercises - Supine Quadricep Sets  - 2 x daily - 7 x weekly - 3 sets - 10 reps - 5 sec hold - Active Straight Leg Raise with Quad Set  - 2 x daily - 7 x weekly - 3 sets - 10 reps - Sidelying Hip Abduction  - 2 x daily - 7 x weekly - 3 sets - 10 reps - Seated Long Arc Quad  - 2 x daily - 7 x weekly - 3 sets - 10 reps - 5 sec hold   ASSESSMENT:   CLINICAL IMPRESSION: Pt was able to complete all prescribed exercises with no adverse effect or increase in pain. Therapy again focused on progressing quad and proximal hip strength in order to improve knee stability and decrease pain. Pt is progressing well and will continue to be seen and progressed as able.       OBJECTIVE IMPAIRMENTS Abnormal gait, decreased activity tolerance, decreased balance, decreased strength, and pain    ACTIVITY LIMITATIONS carrying, lifting, standing, squatting, and stairs   PARTICIPATION LIMITATIONS: community activity, occupation, and yard work   PERSONAL FACTORS Past/current experiences and 1-2 comorbidities: Anxiety, Seizures, Ankle/Knee and Thumb surgeries  are also affecting patient's functional outcome.      GOALS: Goals reviewed with patient? No   SHORT TERM GOALS: Target date: 07/11/2022  Pt will be compliant and knowledgeable with initial HEP for improved comfort and carryover Baseline: initial HEP given  Goal status: INITIAL   2.  Pt will self report R knee pain no greater than 3/10 for improved comfort and functional ability Baseline: 5/10 at worst Goal status: INITIAL    LONG TERM GOALS: Target date: 08/15/2022    Pt will self report R knee pain no greater than 1/10 for improved comfort and functional ability Baseline: 5/10 at worst Goal status: INITIAL   2.  Pt will improve FOTO function score to no less than 82% as proxy for  functional improvement Baseline: 65% function Goal status: INITIAL    3.  Pt will be able to hold SLS on R LE for no less than 60" for improved balance and functional mobility Baseline: unable  Goal status: INITIAL    4.  Pt will be able to ambulate up/down 15 steps with reciprocal gait and no increase in knee pain for improved mobility and community navigation Baseline: unable Goal status: INITIAL   5.  Pt will increase 30 Second Sit to Stand rep count by MDIC (2 reps) for improved balance, strength, and functional mobility Baseline: will assess next session Goal status: INITIAL      PLAN: PT FREQUENCY: 2x/week   PT DURATION: 8 weeks   PLANNED INTERVENTIONS: Therapeutic exercises, Therapeutic activity, Neuromuscular re-education, Balance training, Gait training, Patient/Family education, Self Care, Joint mobilization, Aquatic Therapy, Dry Needling, Electrical stimulation, Cryotherapy, Moist heat, Vasopneumatic device, Manual therapy, and Re-evaluation   PLAN FOR NEXT SESSION: 30 Second STS, assess HEP response, progress quad and proximal hip strength    Ward Chatters, PT 07/04/2022, 5:14 PM

## 2022-07-04 NOTE — Progress Notes (Signed)
   Established Patient Office Visit  Subjective   Patient ID: Christopher Reed, male    DOB: 1991/06/30  Age: 30 y.o. MRN: [id number]  Follow-up right knee.  He presents today for 4-week follow-up status post patellar dislocation with MPFL tear.  He has been working with formal physical therapy since we last saw him along with some home exercises.  With improvement in his knee pain and strength.  He does report instability only once when he jumped down the other day.  He has transitioned himself into compression sleeve from knee brace.  He denies any new injury, swelling or locking of that knee.  ROS as listed above in HPI    Objective:     BP 132/70   Ht '5\' 11"'$  (1.803 m)   BMI 32.08 kg/m   Physical Exam Vitals reviewed.  Constitutional:      General: He is not in acute distress.    Appearance: Normal appearance. He is not ill-appearing, toxic-appearing or diaphoretic.  Pulmonary:     Effort: Pulmonary effort is normal.  Neurological:     Mental Status: He is alert.   Right knee: No obvious deformity or asymmetry.  No ecchymosis.  Well-healed surgical scar over medial patella. Trace effusion.  Tenderness to palpation along the MPFL.  No tenderness to palpation along the lateral joint line or patellar facets medial or lateral.  Full range of motion with some hyperextension about 5 degrees.  Strength 5/5 resisted flexion and extension.  Stable to varus and valgus stress.  I can translate his patella approximately 2 quadrants however this does not cause him any apprehension.  Nonantalgic gait    Assessment & Plan:   Problem List Items Addressed This Visit       Musculoskeletal and Integument   Dislocation of right patella - Primary    He continues to improve and has begun formal physical therapy.  He has weaned himself from a hinged knee brace to a compression sleeve.  Patient has good strength on exam.  Recommend continuing with physical therapy and home exercises for the next  couple weeks.  Discussed with patient if he continues to have instability he may need to seek surgical intervention however I believe he is doing well at this time.  Return to clinic as needed.       Return if symptoms worsen or fail to improve.    Elmore Guise, DO

## 2022-07-04 NOTE — Assessment & Plan Note (Signed)
He continues to improve and has begun formal physical therapy.  He has weaned himself from a hinged knee brace to a compression sleeve.  Patient has good strength on exam.  Recommend continuing with physical therapy and home exercises for the next couple weeks.  Discussed with patient if he continues to have instability he may need to seek surgical intervention however I believe he is doing well at this time.  Return to clinic as needed.

## 2022-07-10 ENCOUNTER — Ambulatory Visit: Payer: Medicare Other | Admitting: Physical Therapy

## 2022-07-10 ENCOUNTER — Encounter: Payer: Self-pay | Admitting: Physical Therapy

## 2022-07-10 DIAGNOSIS — M6281 Muscle weakness (generalized): Secondary | ICD-10-CM | POA: Diagnosis not present

## 2022-07-10 DIAGNOSIS — R2681 Unsteadiness on feet: Secondary | ICD-10-CM

## 2022-07-10 DIAGNOSIS — M25561 Pain in right knee: Secondary | ICD-10-CM | POA: Diagnosis not present

## 2022-07-10 NOTE — Therapy (Signed)
OUTPATIENT PHYSICAL THERAPY TREATMENT NOTE   Patient Name: Christopher Reed MRN: [id number] DOB:07-22-1991, 31 y.o., male Today's Date: 07/10/2022  PCP: Biagio Borg, MD  REFERRING PROVIDER: Dene Gentry, MD  END OF SESSION:   PT End of Session - 07/10/22 1526     Visit Number 5    Number of Visits 17    Date for PT Re-Evaluation 08/15/22    Authorization Type [employer] Medicare    PT Start Time 8115    PT Stop Time 1611    PT Time Calculation (min) 41 min    Activity Tolerance Patient tolerated treatment well    Behavior During Therapy WFL for tasks assessed/performed               Past Medical History:  Diagnosis Date   Anxiety    Seizures ([place])    epilepsy since age 56yr, sz 03/20/20   SINUSITIS- ACUTE-NOS 09/25/2010   TESTICULAR MASS 11/09/2009   UTI 11/09/2009   Past Surgical History:  Procedure Laterality Date   ANKLE ARTHROSCOPY Left    DG THUMB RIGHT HAND ([facility])     2-3 yrs ago 2015 or 2016   Knee surgury  03/2006   Right knee - meniscus tear/minor ACL tear   LIGAMENT REPAIR Right 05/07/2013   Procedure: cheilectomy, microfracture of metacarpal head surface right thumb;  Surgeon: RCammie Sickle, MD;  [place] [facility]  Service: Orthopedics;  Laterality: Right;   MYRINGOTOMY WITH TUBE PLACEMENT     as child   TOE SURGERY Right    Patient Active Problem List   Diagnosis Date Noted   Dislocation of right patella 07/04/2022   Vitamin D deficiency 08/11/2021   GERD (gastroesophageal reflux disease) 08/11/2021   Obesity 08/11/2021   COVID-19 virus infection 06/26/2021   Hand swelling 03/28/2021   Ganglion cyst of finger 06/16/2020   Abnormal urinalysis 04/06/2020   Renal insufficiency 04/05/2020   Hyperglycemia 04/05/2020   Protein in urine 06/15/2016   Angioedema 06/10/2014   Generalized convulsive epilepsy without mention of intractable epilepsy 03/16/2013   Generalized nonconvulsive epilepsy without mention of intractable  epilepsy 03/16/2013   Transient alteration of awareness 03/16/2013   Essential and other specified forms of tremor 03/16/2013   Encounter for long-term (current) use of other medications 03/16/2013   Head injury, unspecified 03/16/2013   Other malaise and fatigue 03/16/2013   Anxiety    Encounter for well adult exam with abnormal findings 10/27/2011    REFERRING DIAG: M25.561 (ICD-10-CM) - Right knee pain, unspecified chronicity  THERAPY DIAG:  Acute pain of right knee  Muscle weakness (generalized)  Unsteadiness on feet  Rationale for Evaluation and Treatment Rehabilitation  PERTINENT HISTORY: Anxiety, Seizures, Ankle/Knee and Thumb surgeries    PRECAUTIONS: None  SUBJECTIVE:  Pt reports that his knee was a little sore after therapy.  His pain has now returned to baseline.  PAIN:  Are you having pain?  No: NPRS scale: 0/10 - (5/10 at worst) Pain location: R medial knee Pain description: sharp, pulsating Aggravating factors: standing Relieving factors: rest   OBJECTIVE: (objective measures completed at initial evaluation unless otherwise dated)  DIAGNOSTIC FINDINGS:            See Imagining for recent MRI   PATIENT SURVEYS:  FOTO: 65% function; 82% predicted   COGNITION:           Overall cognitive status: Within functional limits for tasks assessed  SENSATION: WFL   POSTURE:  medium body habitus; mild medial knee edema   PALPATION: Slight TTP to R medial knee joint line   LOWER EXTREMITY ROM:   Active ROM Right eval Left eval  Hip flexion      Hip extension      Hip abduction      Hip adduction      Hip internal rotation      Hip external rotation      Knee flexion 120    Knee extension 0    Ankle dorsiflexion      Ankle plantarflexion      Ankle inversion      Ankle eversion       (Blank rows = not tested)   LOWER EXTREMITY MMT:   MMT Right eval Left eval  Hip flexion 5/5 5/5  Hip extension      Hip abduction  4/5 5/5  Hip adduction      Hip internal rotation      Hip external rotation      Knee flexion 4/5 5/5  Knee extension 4/5 5/5  Ankle dorsiflexion      Ankle plantarflexion      Ankle inversion      Ankle eversion       (Blank rows = not tested)   LOWER EXTREMITY SPECIAL TESTS:  DNT   FUNCTIONAL TESTS:  SLS: unable    GAIT: Distance walked: 34f Assistive device utilized: None Level of assistance: Complete Independence Comments: antaglic gait R, decreased knee flexion R       TODAY'S TREATMENT:  [facility] Adult PT Treatment:                                                DATE: 07/10/2022 Therapeutic Exercise: Rec bike lvl 3 x 3 min while taking subjective Seated knee ext 3x15 10# R only Seated hamstring curl 45# 2x10 SLR 3x15 3#  S/L hip abd 2x10 3# S/L clamshell 2x15 Blue TB Standing hip abd/ext 2x10 25# SLS on foam 3x45" Wall squat 2x12 Eccentric heel tap 2x12 R  [facility] Adult PT Treatment:                                                DATE: 07/04/2022 Therapeutic Exercise: Rec bike lvl 3 x 3 min while taking subjective Seated knee ext 2x15 10# R only Seated hamstring curl 45# 2x10 SLR 2x15 2.5#  S/L hip abd 2x10 2.5# S/L clamshell 2x15 GTB Standing hip abd/ext 2x10 25# LAQ 2x15 7.5# - 5" hold  SLS on foam 2x30" Wall squat 2x10 Eccentric heel tap 2x10 R  [facility] Adult PT Treatment:                                                DATE: 06/28/2022 Therapeutic Exercise: Rec bike lvl 3 x 3 min while taking subjective Seated knee ext 2x10 10# R only Seated hamstring curl 45# 2x10 SLR 3x10 2.5#  S/L clamshell 2x15 GTB Bridge 3x15  Standing hip abd/ext 2x10 25# LAQ 2x15 5# - 5" hold  STS 2x10  Wall squat 2x10  [facility] Adult PT Treatment:                                                DATE: 06/27/2022 Therapeutic Exercise: Rec bike lvl 3 x 3 min while taking subjective SLR 3x15  S/L clamshell 2x10 GTB Bridge 3x10  Standing hip abd 2x10 25# LAQ 2x15 5# - 5" hold  STS  2x10 Seated hamstring curl 35# 3x10 Wall squat 2x10   PATIENT EDUCATION:  Education details: update HEP Person educated: Patient Education method: Explanation, Demonstration, and Handouts Education comprehension: verbalized understanding and returned demonstration     HOME EXERCISE PROGRAM: Access Code: [email or web address] URL: [email or web address] Date: 06/20/2022 Prepared by: Octavio Manns   Exercises - Supine Quadricep Sets  - 2 x daily - 7 x weekly - 3 sets - 10 reps - 5 sec hold - Active Straight Leg Raise with Quad Set  - 2 x daily - 7 x weekly - 3 sets - 10 reps - Sidelying Hip Abduction  - 2 x daily - 7 x weekly - 3 sets - 10 reps - Seated Long Arc Quad  - 2 x daily - 7 x weekly - 3 sets - 10 reps - 5 sec hold   ASSESSMENT:   CLINICAL IMPRESSION: Pt was able to complete all prescribed exercises with no adverse effect or increase in pain. We were able to increase intensity of several mat exercises with no adverse reaction.  Will continue to progress as tolerated.     OBJECTIVE IMPAIRMENTS Abnormal gait, decreased activity tolerance, decreased balance, decreased strength, and pain    ACTIVITY LIMITATIONS carrying, lifting, standing, squatting, and stairs   PARTICIPATION LIMITATIONS: community activity, occupation, and yard work   PERSONAL FACTORS Past/current experiences and 1-2 comorbidities: Anxiety, Seizures, Ankle/Knee and Thumb surgeries  are also affecting patient's functional outcome.      GOALS: Goals reviewed with patient? No   SHORT TERM GOALS: Target date: 07/11/2022  Pt will be compliant and knowledgeable with initial HEP for improved comfort and carryover Baseline: initial HEP given  Goal status: MET   2.  Pt will self report R knee pain no greater than 3/10 for improved comfort and functional ability Baseline: 5/10 at worst 9/19: 5/10 at worst Goal status: Ongoing   LONG TERM GOALS: Target date: 08/15/2022    Pt will self report R knee pain  no greater than 1/10 for improved comfort and functional ability Baseline: 5/10 at worst Goal status: INITIAL   2.  Pt will improve FOTO function score to no less than 82% as proxy for functional improvement Baseline: 65% function Goal status: INITIAL    3.  Pt will be able to hold SLS on R LE for no less than 60" for improved balance and functional mobility Baseline: unable  Goal status: INITIAL    4.  Pt will be able to ambulate up/down 15 steps with reciprocal gait and no increase in knee pain for improved mobility and community navigation Baseline: unable Goal status: INITIAL   5.  Pt will increase 30 Second Sit to Stand rep count by MDIC (2 reps) for improved balance, strength, and functional mobility Baseline: will assess next session Goal status: INITIAL      PLAN: PT FREQUENCY: 2x/week   PT DURATION: 8 weeks   PLANNED INTERVENTIONS: Therapeutic exercises, Therapeutic activity, Neuromuscular  re-education, Balance training, Gait training, Patient/Family education, Self Care, Joint mobilization, Aquatic Therapy, Dry Needling, Electrical stimulation, Cryotherapy, Moist heat, Vasopneumatic device, Manual therapy, and Re-evaluation   PLAN FOR NEXT SESSION: 30 Second STS, assess HEP response, progress quad and proximal hip strength    Mathis Dad, PT 07/10/2022, 4:11 PM

## 2022-07-11 ENCOUNTER — Ambulatory Visit: Payer: Medicare Other

## 2022-07-11 DIAGNOSIS — M25561 Pain in right knee: Secondary | ICD-10-CM

## 2022-07-11 DIAGNOSIS — M6281 Muscle weakness (generalized): Secondary | ICD-10-CM | POA: Diagnosis not present

## 2022-07-11 DIAGNOSIS — R2681 Unsteadiness on feet: Secondary | ICD-10-CM | POA: Diagnosis not present

## 2022-07-11 NOTE — Therapy (Signed)
OUTPATIENT PHYSICAL THERAPY TREATMENT NOTE   Patient Name: Christopher Reed MRN: [id number] DOB:08-06-1991, 31 y.o., male Today's Date: 07/11/2022  PCP: Biagio Borg, MD  REFERRING PROVIDER: Dene Gentry, MD  END OF SESSION:   PT End of Session - 07/11/22 1338     Visit Number 6    Number of Visits 17    Date for PT Re-Evaluation 08/15/22    Authorization Type [employer] Medicare    PT Start Time 0737    PT Stop Time 1435    PT Time Calculation (min) 40 min    Activity Tolerance Patient tolerated treatment well    Behavior During Therapy WFL for tasks assessed/performed                Past Medical History:  Diagnosis Date   Anxiety    Seizures ([place])    epilepsy since age 69yr, sz 03/20/20   SINUSITIS- ACUTE-NOS 09/25/2010   TESTICULAR MASS 11/09/2009   UTI 11/09/2009   Past Surgical History:  Procedure Laterality Date   ANKLE ARTHROSCOPY Left    DG THUMB RIGHT HAND ([facility])     2-3 yrs ago 2015 or 2016   Knee surgury  03/2006   Right knee - meniscus tear/minor ACL tear   LIGAMENT REPAIR Right 05/07/2013   Procedure: cheilectomy, microfracture of metacarpal head surface right thumb;  Surgeon: RCammie Sickle, MD;  Location[facility]  Service: Orthopedics;  Laterality: Right;   MYRINGOTOMY WITH TUBE PLACEMENT     as child   TOE SURGERY Right    Patient Active Problem List   Diagnosis Date Noted   Dislocation of right patella 07/04/2022   Vitamin D deficiency 08/11/2021   GERD (gastroesophageal reflux disease) 08/11/2021   Obesity 08/11/2021   COVID-19 virus infection 06/26/2021   Hand swelling 03/28/2021   Ganglion cyst of finger 06/16/2020   Abnormal urinalysis 04/06/2020   Renal insufficiency 04/05/2020   Hyperglycemia 04/05/2020   Protein in urine 06/15/2016   Angioedema 06/10/2014   Generalized convulsive epilepsy without mention of intractable epilepsy 03/16/2013   Generalized nonconvulsive epilepsy without mention of  intractable epilepsy 03/16/2013   Transient alteration of awareness 03/16/2013   Essential and other specified forms of tremor 03/16/2013   Encounter for long-term (current) use of other medications 03/16/2013   Head injury, unspecified 03/16/2013   Other malaise and fatigue 03/16/2013   Anxiety    Encounter for well adult exam with abnormal findings 10/27/2011    REFERRING DIAG: M25.561 (ICD-10-CM) - Right knee pain, unspecified chronicity  THERAPY DIAG:  Acute pain of right knee  Muscle weakness (generalized)  Unsteadiness on feet  Rationale for Evaluation and Treatment Rehabilitation  PERTINENT HISTORY: Anxiety, Seizures, Ankle/Knee and Thumb surgeries    PRECAUTIONS: None  SUBJECTIVE:  Pt presents to PT with no current pain. Has been compliant with HEP with no adverse effect. Ready to begin PT at this time.  PAIN:  Are you having pain?  No: NPRS scale: 0/10 - (5/10 at worst) Pain location: R medial knee Pain description: sharp, pulsating Aggravating factors: standing Relieving factors: rest   OBJECTIVE: (objective measures completed at initial evaluation unless otherwise dated)  DIAGNOSTIC FINDINGS:            See Imagining for recent MRI   PATIENT SURVEYS:  FOTO: 65% function; 82% predicted   COGNITION:           Overall cognitive status: Within functional limits for tasks assessed  SENSATION: WFL   POSTURE:  medium body habitus; mild medial knee edema   PALPATION: Slight TTP to R medial knee joint line   LOWER EXTREMITY ROM:   Active ROM Right eval Left eval  Hip flexion      Hip extension      Hip abduction      Hip adduction      Hip internal rotation      Hip external rotation      Knee flexion 120    Knee extension 0    Ankle dorsiflexion      Ankle plantarflexion      Ankle inversion      Ankle eversion       (Blank rows = not tested)   LOWER EXTREMITY MMT:   MMT Right eval Left eval  Hip flexion 5/5  5/5  Hip extension      Hip abduction 4/5 5/5  Hip adduction      Hip internal rotation      Hip external rotation      Knee flexion 4/5 5/5  Knee extension 4/5 5/5  Ankle dorsiflexion      Ankle plantarflexion      Ankle inversion      Ankle eversion       (Blank rows = not tested)   LOWER EXTREMITY SPECIAL TESTS:  DNT   FUNCTIONAL TESTS:  SLS: unable    GAIT: Distance walked: 32f Assistive device utilized: None Level of assistance: Complete Independence Comments: antaglic gait R, decreased knee flexion R       TODAY'S TREATMENT: [facility] Adult PT Treatment:                                                DATE: 07/11/2022 Therapeutic Exercise: Rec bike lvl 3 x 3 min while taking subjective Seated knee ext 3x15 10# R only Seated hamstring curl 45# 2x10 Standing hip abd/ext 2x10 25# Cybex leg press 2x10 80# SLS on foam 2x45" Eccentric heel tap 2x12 R 4in TKE 2x10 GTB R LE KB Deadlift 2x10 15# Lateral walk GTB [id number] squat 2x10 15#   [facility] Adult PT Treatment:                                                DATE: 07/10/2022 Therapeutic Exercise: Rec bike lvl 3 x 3 min while taking subjective Seated knee ext 3x15 10# R only Seated hamstring curl 45# 2x10 SLR 3x15 3#  S/L hip abd 2x10 3# S/L clamshell 2x15 Blue TB Standing hip abd/ext 2x10 25# SLS on foam 3x45" Wall squat 2x12 Eccentric heel tap 2x12 R  [facility] Adult PT Treatment:                                                DATE: 07/04/2022 Therapeutic Exercise: Rec bike lvl 3 x 3 min while taking subjective Seated knee ext 2x15 10# R only Seated hamstring curl 45# 2x10 SLR 2x15 2.5#  S/L hip abd 2x10 2.5# S/L clamshell 2x15 GTB Standing hip abd/ext 2x10 25# LAQ 2x15 7.5# - 5" hold  SLS on foam 2x30" Wall squat 2x10 Eccentric heel tap 2x10 R  PATIENT EDUCATION:  Education details: update HEP Person educated: Patient Education method: Explanation, Demonstration, and Handouts Education comprehension:  verbalized understanding and returned demonstration     HOME EXERCISE PROGRAM: Access Code: [email or web address] URL: [email or web address] Date: 06/20/2022 Prepared by: Octavio Manns   Exercises - Supine Quadricep Sets  - 2 x daily - 7 x weekly - 3 sets - 10 reps - 5 sec hold - Active Straight Leg Raise with Quad Set  - 2 x daily - 7 x weekly - 3 sets - 10 reps - Sidelying Hip Abduction  - 2 x daily - 7 x weekly - 3 sets - 10 reps - Seated Long Arc Quad  - 2 x daily - 7 x weekly - 3 sets - 10 reps - 5 sec hold   ASSESSMENT:   CLINICAL IMPRESSION: Pt was able to complete all prescribed exercises with no adverse effect or increase in pain. Progressed exercises to perform standing loaded knee flexion and more proximal hip work. Will continue to progress as tolerated.     OBJECTIVE IMPAIRMENTS Abnormal gait, decreased activity tolerance, decreased balance, decreased strength, and pain    ACTIVITY LIMITATIONS carrying, lifting, standing, squatting, and stairs   PARTICIPATION LIMITATIONS: community activity, occupation, and yard work   PERSONAL FACTORS Past/current experiences and 1-2 comorbidities: Anxiety, Seizures, Ankle/Knee and Thumb surgeries  are also affecting patient's functional outcome.      GOALS: Goals reviewed with patient? No   SHORT TERM GOALS: Target date: 07/11/2022  Pt will be compliant and knowledgeable with initial HEP for improved comfort and carryover Baseline: initial HEP given  Goal status: MET   2.  Pt will self report R knee pain no greater than 3/10 for improved comfort and functional ability Baseline: 5/10 at worst 9/19: 5/10 at worst Goal status: Ongoing   LONG TERM GOALS: Target date: 08/15/2022    Pt will self report R knee pain no greater than 1/10 for improved comfort and functional ability Baseline: 5/10 at worst Goal status: INITIAL   2.  Pt will improve FOTO function score to no less than 82% as proxy for functional  improvement Baseline: 65% function Goal status: INITIAL    3.  Pt will be able to hold SLS on R LE for no less than 60" for improved balance and functional mobility Baseline: unable  Goal status: INITIAL    4.  Pt will be able to ambulate up/down 15 steps with reciprocal gait and no increase in knee pain for improved mobility and community navigation Baseline: unable Goal status: INITIAL   5.  Pt will increase 30 Second Sit to Stand rep count by MDIC (2 reps) for improved balance, strength, and functional mobility Baseline: will assess next session Goal status: INITIAL      PLAN: PT FREQUENCY: 2x/week   PT DURATION: 8 weeks   PLANNED INTERVENTIONS: Therapeutic exercises, Therapeutic activity, Neuromuscular re-education, Balance training, Gait training, Patient/Family education, Self Care, Joint mobilization, Aquatic Therapy, Dry Needling, Electrical stimulation, Cryotherapy, Moist heat, Vasopneumatic device, Manual therapy, and Re-evaluation   PLAN FOR NEXT SESSION: 30 Second STS, assess HEP response, progress quad and proximal hip strength    Ward Chatters, PT 07/11/2022, 2:50 PM

## 2022-07-18 ENCOUNTER — Ambulatory Visit: Payer: Medicare Other

## 2022-07-18 DIAGNOSIS — M6281 Muscle weakness (generalized): Secondary | ICD-10-CM | POA: Diagnosis not present

## 2022-07-18 DIAGNOSIS — M25561 Pain in right knee: Secondary | ICD-10-CM

## 2022-07-18 DIAGNOSIS — R2681 Unsteadiness on feet: Secondary | ICD-10-CM | POA: Diagnosis not present

## 2022-07-18 NOTE — Therapy (Signed)
OUTPATIENT PHYSICAL THERAPY TREATMENT NOTE   Patient Name: Christopher Reed MRN: [id number] DOB:10/26/1990, 31 y.o., male Today's Date: 07/18/2022  PCP: Biagio Borg, MD  REFERRING PROVIDER: Dene Gentry, MD  END OF SESSION:   PT End of Session - 07/18/22 1449     Visit Number 7    Number of Visits 17    Date for PT Re-Evaluation 08/15/22    Authorization Type [employer] Medicare    PT Start Time 8413    PT Stop Time 1528    PT Time Calculation (min) 39 min    Activity Tolerance Patient tolerated treatment well    Behavior During Therapy WFL for tasks assessed/performed              Past Medical History:  Diagnosis Date   Anxiety    Seizures ([place])    epilepsy since age 66yr, sz 03/20/20   SINUSITIS- ACUTE-NOS 09/25/2010   TESTICULAR MASS 11/09/2009   UTI 11/09/2009   Past Surgical History:  Procedure Laterality Date   ANKLE ARTHROSCOPY Left    DG THUMB RIGHT HAND ([facility])     2-3 yrs ago 2015 or 2016   Knee surgury  03/2006   Right knee - meniscus tear/minor ACL tear   LIGAMENT REPAIR Right 05/07/2013   Procedure: cheilectomy, microfracture of metacarpal head surface right thumb;  Surgeon: RCammie Sickle, MD;  Location[facility]  Service: Orthopedics;  Laterality: Right;   MYRINGOTOMY WITH TUBE PLACEMENT     as child   TOE SURGERY Right    Patient Active Problem List   Diagnosis Date Noted   Dislocation of right patella 07/04/2022   Vitamin D deficiency 08/11/2021   GERD (gastroesophageal reflux disease) 08/11/2021   Obesity 08/11/2021   COVID-19 virus infection 06/26/2021   Hand swelling 03/28/2021   Ganglion cyst of finger 06/16/2020   Abnormal urinalysis 04/06/2020   Renal insufficiency 04/05/2020   Hyperglycemia 04/05/2020   Protein in urine 06/15/2016   Angioedema 06/10/2014   Generalized convulsive epilepsy without mention of intractable epilepsy 03/16/2013   Generalized nonconvulsive epilepsy without mention of intractable  epilepsy 03/16/2013   Transient alteration of awareness 03/16/2013   Essential and other specified forms of tremor 03/16/2013   Encounter for long-term (current) use of other medications 03/16/2013   Head injury, unspecified 03/16/2013   Other malaise and fatigue 03/16/2013   Anxiety    Encounter for well adult exam with abnormal findings 10/27/2011    REFERRING DIAG: M25.561 (ICD-10-CM) - Right knee pain, unspecified chronicity  THERAPY DIAG:  Acute pain of right knee  Muscle weakness (generalized)  Unsteadiness on feet  Rationale for Evaluation and Treatment Rehabilitation  PERTINENT HISTORY: Anxiety, Seizures, Ankle/Knee and Thumb surgeries    PRECAUTIONS: None  SUBJECTIVE: Patient reports mild to no pain in his knee and that it gets up to a 5/10 at it's worst.   PAIN:  Are you having pain?  No: NPRS scale: 1/10 - (5/10 at worst) Pain location: R medial knee Pain description: sharp, pulsating Aggravating factors: standing Relieving factors: rest   OBJECTIVE: (objective measures completed at initial evaluation unless otherwise dated)  DIAGNOSTIC FINDINGS:            See Imagining for recent MRI   PATIENT SURVEYS:  FOTO: 65% function; 82% predicted   COGNITION:           Overall cognitive status: Within functional limits for tasks assessed  SENSATION: WFL   POSTURE:  medium body habitus; mild medial knee edema   PALPATION: Slight TTP to R medial knee joint line   LOWER EXTREMITY ROM:   Active ROM Right eval Left eval  Hip flexion      Hip extension      Hip abduction      Hip adduction      Hip internal rotation      Hip external rotation      Knee flexion 120    Knee extension 0    Ankle dorsiflexion      Ankle plantarflexion      Ankle inversion      Ankle eversion       (Blank rows = not tested)   LOWER EXTREMITY MMT:   MMT Right eval Left eval  Hip flexion 5/5 5/5  Hip extension      Hip abduction 4/5 5/5   Hip adduction      Hip internal rotation      Hip external rotation      Knee flexion 4/5 5/5  Knee extension 4/5 5/5  Ankle dorsiflexion      Ankle plantarflexion      Ankle inversion      Ankle eversion       (Blank rows = not tested)   LOWER EXTREMITY SPECIAL TESTS:  DNT   FUNCTIONAL TESTS:  SLS: unable    GAIT: Distance walked: 59f Assistive device utilized: None Level of assistance: Complete Independence Comments: antaglic gait R, decreased knee flexion R       TODAY'S TREATMENT: [facility] Adult PT Treatment:                                                DATE: 07/18/2022 Therapeutic Exercise: Rec bike lvl 3 x 5 min while taking subjective Seated knee ext 3x15 10# R only Seated hamstring curl 45# 3x10 Standing hip abd/ext 2x10 25# Eccentric heel tap 2x12 R 4in Lateral walk GTB 4 laps at counter Goblet squat 2x10 15#  Neuromuscular Re-ed: SLS on foam 2x45"  [facility] Adult PT Treatment:                                                DATE: 07/11/2022 Therapeutic Exercise: Rec bike lvl 3 x 3 min while taking subjective Seated knee ext 3x15 10# R only Seated hamstring curl 45# 2x10 Standing hip abd/ext 2x10 25# Cybex leg press 2x10 80# SLS on foam 2x45" Eccentric heel tap 2x12 R 4in TKE 2x10 GTB R LE KB Deadlift 2x10 15# Lateral walk GTB 4x181fGoblet squat 2x10 15#   [facility] Adult PT Treatment:                                                DATE: 07/10/2022 Therapeutic Exercise: Rec bike lvl 3 x 3 min while taking subjective Seated knee ext 3x15 10# R only Seated hamstring curl 45# 2x10 SLR 3x15 3#  S/L hip abd 2x10 3# S/L clamshell 2x15 Blue TB Standing hip abd/ext 2x10 25# SLS on foam 3x45" Wall squat 2x12 Eccentric  heel tap 2x12 R  PATIENT EDUCATION:  Education details: update HEP Person educated: Patient Education method: Explanation, Demonstration, and Handouts Education comprehension: verbalized understanding and returned demonstration     HOME  EXERCISE PROGRAM: Access Code: [email or web address] URL: [email or web address] Date: 06/20/2022 Prepared by: Octavio Manns   Exercises - Supine Quadricep Sets  - 2 x daily - 7 x weekly - 3 sets - 10 reps - 5 sec hold - Active Straight Leg Raise with Quad Set  - 2 x daily - 7 x weekly - 3 sets - 10 reps - Sidelying Hip Abduction  - 2 x daily - 7 x weekly - 3 sets - 10 reps - Seated Long Arc Quad  - 2 x daily - 7 x weekly - 3 sets - 10 reps - 5 sec hold   ASSESSMENT:   CLINICAL IMPRESSION: Patient presents to PT with mild pain in his Rt knee and reports HEP compliance. Session today continued to focus on L knee and proximal hip strengthening. Patient was able to tolerate all prescribed exercises with no adverse effects. Patient continues to benefit from skilled PT services and should be progressed as able to improve functional independence.    OBJECTIVE IMPAIRMENTS Abnormal gait, decreased activity tolerance, decreased balance, decreased strength, and pain    ACTIVITY LIMITATIONS carrying, lifting, standing, squatting, and stairs   PARTICIPATION LIMITATIONS: community activity, occupation, and yard work   PERSONAL FACTORS Past/current experiences and 1-2 comorbidities: Anxiety, Seizures, Ankle/Knee and Thumb surgeries  are also affecting patient's functional outcome.      GOALS: Goals reviewed with patient? No   SHORT TERM GOALS: Target date: 07/11/2022  Pt will be compliant and knowledgeable with initial HEP for improved comfort and carryover Baseline: initial HEP given  Goal status: MET   2.  Pt will self report R knee pain no greater than 3/10 for improved comfort and functional ability Baseline: 5/10 at worst 9/19: 5/10 at worst Goal status: Ongoing   LONG TERM GOALS: Target date: 08/15/2022    Pt will self report R knee pain no greater than 1/10 for improved comfort and functional ability Baseline: 5/10 at worst Goal status: INITIAL   2.  Pt will improve FOTO function  score to no less than 82% as proxy for functional improvement Baseline: 65% function Goal status: INITIAL    3.  Pt will be able to hold SLS on R LE for no less than 60" for improved balance and functional mobility Baseline: unable  Goal status: INITIAL    4.  Pt will be able to ambulate up/down 15 steps with reciprocal gait and no increase in knee pain for improved mobility and community navigation Baseline: unable Goal status: INITIAL   5.  Pt will increase 30 Second Sit to Stand rep count by MDIC (2 reps) for improved balance, strength, and functional mobility Baseline: will assess next session Goal status: INITIAL      PLAN: PT FREQUENCY: 2x/week   PT DURATION: 8 weeks   PLANNED INTERVENTIONS: Therapeutic exercises, Therapeutic activity, Neuromuscular re-education, Balance training, Gait training, Patient/Family education, Self Care, Joint mobilization, Aquatic Therapy, Dry Needling, Electrical stimulation, Cryotherapy, Moist heat, Vasopneumatic device, Manual therapy, and Re-evaluation   PLAN FOR NEXT SESSION: assess HEP response, progress quad and proximal hip strength    Margarette Canada, PTA 07/18/2022, 3:29 PM

## 2022-07-21 ENCOUNTER — Ambulatory Visit: Payer: Medicare Other

## 2022-07-21 DIAGNOSIS — M25561 Pain in right knee: Secondary | ICD-10-CM | POA: Diagnosis not present

## 2022-07-21 DIAGNOSIS — R2681 Unsteadiness on feet: Secondary | ICD-10-CM | POA: Diagnosis not present

## 2022-07-21 DIAGNOSIS — M6281 Muscle weakness (generalized): Secondary | ICD-10-CM

## 2022-07-21 NOTE — Therapy (Signed)
OUTPATIENT PHYSICAL THERAPY TREATMENT NOTE   Patient Name: Christopher Reed MRN: [id number] DOB:Feb 25, 1991, 31 y.o., male Today's Date: 07/21/2022  PCP: Biagio Borg, MD  REFERRING PROVIDER: Dene Gentry, MD  END OF SESSION:   PT End of Session - 07/21/22 1035     Visit Number 8    Number of Visits 17    Date for PT Re-Evaluation 08/15/22    Authorization Type [employer] Medicare    PT Start Time 1034    PT Stop Time 1114    PT Time Calculation (min) 40 min    Activity Tolerance Patient tolerated treatment well    Behavior During Therapy WFL for tasks assessed/performed               Past Medical History:  Diagnosis Date   Anxiety    Seizures ([place])    epilepsy since age 34yr, sz 03/20/20   SINUSITIS- ACUTE-NOS 09/25/2010   TESTICULAR MASS 11/09/2009   UTI 11/09/2009   Past Surgical History:  Procedure Laterality Date   ANKLE ARTHROSCOPY Left    DG THUMB RIGHT HAND ([facility])     2-3 yrs ago 2015 or 2016   Knee surgury  03/2006   Right knee - meniscus tear/minor ACL tear   LIGAMENT REPAIR Right 05/07/2013   Procedure: cheilectomy, microfracture of metacarpal head surface right thumb;  Surgeon: RCammie Sickle, MD;  [place] [facility]  Service: Orthopedics;  Laterality: Right;   MYRINGOTOMY WITH TUBE PLACEMENT     as child   TOE SURGERY Right    Patient Active Problem List   Diagnosis Date Noted   Dislocation of right patella 07/04/2022   Vitamin D deficiency 08/11/2021   GERD (gastroesophageal reflux disease) 08/11/2021   Obesity 08/11/2021   COVID-19 virus infection 06/26/2021   Hand swelling 03/28/2021   Ganglion cyst of finger 06/16/2020   Abnormal urinalysis 04/06/2020   Renal insufficiency 04/05/2020   Hyperglycemia 04/05/2020   Protein in urine 06/15/2016   Angioedema 06/10/2014   Generalized convulsive epilepsy without mention of intractable epilepsy 03/16/2013   Generalized nonconvulsive epilepsy without mention of intractable  epilepsy 03/16/2013   Transient alteration of awareness 03/16/2013   Essential and other specified forms of tremor 03/16/2013   Encounter for long-term (current) use of other medications 03/16/2013   Head injury, unspecified 03/16/2013   Other malaise and fatigue 03/16/2013   Anxiety    Encounter for well adult exam with abnormal findings 10/27/2011    REFERRING DIAG: M25.561 (ICD-10-CM) - Right knee pain, unspecified chronicity  THERAPY DIAG:  Acute pain of right knee  Muscle weakness (generalized)  Unsteadiness on feet  Rationale for Evaluation and Treatment Rehabilitation  PERTINENT HISTORY: Anxiety, Seizures, Ankle/Knee and Thumb surgeries    PRECAUTIONS: None  SUBJECTIVE: Patient reports no current pain and HEP compliance.   PAIN:  Are you having pain?  No: NPRS scale: 0/10 - (5/10 at worst) Pain location: R medial knee Pain description: sharp, pulsating Aggravating factors: standing Relieving factors: rest   OBJECTIVE: (objective measures completed at initial evaluation unless otherwise dated)  DIAGNOSTIC FINDINGS:            See Imagining for recent MRI   PATIENT SURVEYS:  FOTO: 65% function; 82% predicted 07/21/2022: 67%   COGNITION:           Overall cognitive status: Within functional limits for tasks assessed  SENSATION: WFL   POSTURE:  medium body habitus; mild medial knee edema   PALPATION: Slight TTP to R medial knee joint line   LOWER EXTREMITY ROM:   Active ROM Right eval Left eval  Hip flexion      Hip extension      Hip abduction      Hip adduction      Hip internal rotation      Hip external rotation      Knee flexion 120    Knee extension 0    Ankle dorsiflexion      Ankle plantarflexion      Ankle inversion      Ankle eversion       (Blank rows = not tested)   LOWER EXTREMITY MMT:   MMT Right eval Left eval  Hip flexion 5/5 5/5  Hip extension      Hip abduction 4/5 5/5  Hip adduction       Hip internal rotation      Hip external rotation      Knee flexion 4/5 5/5  Knee extension 4/5 5/5  Ankle dorsiflexion      Ankle plantarflexion      Ankle inversion      Ankle eversion       (Blank rows = not tested)   LOWER EXTREMITY SPECIAL TESTS:  DNT   FUNCTIONAL TESTS:  SLS: unable    GAIT: Distance walked: 1f Assistive device utilized: None Level of assistance: Complete Independence Comments: antaglic gait R, decreased knee flexion R       TODAY'S TREATMENT: [facility] Adult PT Treatment:                                                DATE: 07/21/2022 Therapeutic Exercise: Rec bike lvl 3 x 5 min while taking subjective Seated knee ext 3x15 10# R only Seated hamstring curl 45# 3x10 Standing hip abd/ext 2x10 25# Eccentric heel tap 2x12 R 4in Lateral walk GTB 4 laps at counter Goblet squat 2x10 15#  Neuromuscular Re-ed: SLS on foam 2x45" Tandem stance on foam x45" BIL  [facility] Adult PT Treatment:                                                DATE: 07/18/2022 Therapeutic Exercise: Rec bike lvl 3 x 5 min while taking subjective Seated knee ext 3x15 10# R only Seated hamstring curl 45# 3x10 Standing hip abd/ext 2x10 25# Eccentric heel tap 2x12 R 4in Lateral walk GTB 4 laps at counter Goblet squat 2x10 15#  Neuromuscular Re-ed: SLS on foam 2x45"  [facility] Adult PT Treatment:                                                DATE: 07/11/2022 Therapeutic Exercise: Rec bike lvl 3 x 3 min while taking subjective Seated knee ext 3x15 10# R only Seated hamstring curl 45# 2x10 Standing hip abd/ext 2x10 25# Cybex leg press 2x10 80# SLS on foam 2x45" Eccentric heel tap 2x12 R 4in TKE 2x10 GTB R LE KB Deadlift 2x10 15# Lateral  walk GTB 4x8f Goblet squat 2x10 15#    PATIENT EDUCATION:  Education details: update HEP Person educated: Patient Education method: Explanation, Demonstration, and Handouts Education comprehension: verbalized understanding and returned  demonstration     HOME EXERCISE PROGRAM: Access Code: [email or web address]: [email or web address] Date: 06/20/2022 Prepared by: DOctavio Manns  Exercises - Supine Quadricep Sets  - 2 x daily - 7 x weekly - 3 sets - 10 reps - 5 sec hold - Active Straight Leg Raise with Quad Set  - 2 x daily - 7 x weekly - 3 sets - 10 reps - Sidelying Hip Abduction  - 2 x daily - 7 x weekly - 3 sets - 10 reps - Seated Long Arc Quad  - 2 x daily - 7 x weekly - 3 sets - 10 reps - 5 sec hold   ASSESSMENT:   CLINICAL IMPRESSION: Patient presents to PT with no current pain in his Rt knee and reports HEP compliance. Session today continued to focus on L knee and proximal hip strengthening as well as balance training. Re-administered FOTO this session with patient making slight progress towards LTG. Patient was able to tolerate all prescribed exercises with no adverse effects. Patient continues to benefit from skilled PT services and should be progressed as able to improve functional independence.    OBJECTIVE IMPAIRMENTS Abnormal gait, decreased activity tolerance, decreased balance, decreased strength, and pain    ACTIVITY LIMITATIONS carrying, lifting, standing, squatting, and stairs   PARTICIPATION LIMITATIONS: community activity, occupation, and yard work   PERSONAL FACTORS Past/current experiences and 1-2 comorbidities: Anxiety, Seizures, Ankle/Knee and Thumb surgeries  are also affecting patient's functional outcome.      GOALS: Goals reviewed with patient? No   SHORT TERM GOALS: Target date: 07/11/2022  Pt will be compliant and knowledgeable with initial HEP for improved comfort and carryover Baseline: initial HEP given  Goal status: MET   2.  Pt will self report R knee pain no greater than 3/10 for improved comfort and functional ability Baseline: 5/10 at worst 9/19: 5/10 at worst Goal status: Ongoing   LONG TERM GOALS: Target date: 08/15/2022    Pt will self report R knee pain no  greater than 1/10 for improved comfort and functional ability Baseline: 5/10 at worst Goal status: INITIAL   2.  Pt will improve FOTO function score to no less than 82% as proxy for functional improvement Baseline: 65% function Goal status: Progressing 07/21/22: 67%   3.  Pt will be able to hold SLS on R LE for no less than 60" for improved balance and functional mobility Baseline: unable  Goal status: INITIAL    4.  Pt will be able to ambulate up/down 15 steps with reciprocal gait and no increase in knee pain for improved mobility and community navigation Baseline: unable Goal status: INITIAL   5.  Pt will increase 30 Second Sit to Stand rep count by MDIC (2 reps) for improved balance, strength, and functional mobility Baseline: will assess next session Goal status: INITIAL      PLAN: PT FREQUENCY: 2x/week   PT DURATION: 8 weeks   PLANNED INTERVENTIONS: Therapeutic exercises, Therapeutic activity, Neuromuscular re-education, Balance training, Gait training, Patient/Family education, Self Care, Joint mobilization, Aquatic Therapy, Dry Needling, Electrical stimulation, Cryotherapy, Moist heat, Vasopneumatic device, Manual therapy, and Re-evaluation   PLAN FOR NEXT SESSION: assess HEP response, progress quad and proximal hip strength    SMargarette Canada PTA 07/21/2022, 11:14 AM

## 2022-07-24 NOTE — Therapy (Signed)
OUTPATIENT PHYSICAL THERAPY TREATMENT NOTE   Patient Name: Christopher Reed MRN: [id number] DOB:Oct 04, 1991, 31 y.o., male Today's Date: 07/25/2022  PCP: Biagio Borg, MD  REFERRING PROVIDER: Dene Gentry, MD  END OF SESSION:   PT End of Session - 07/25/22 0948     Visit Number 9    Number of Visits 17    Date for PT Re-Evaluation 08/15/22    Authorization Type [employer] Medicare    PT Start Time 0950    PT Stop Time 1028    PT Time Calculation (min) 38 min    Activity Tolerance Patient tolerated treatment well    Behavior During Therapy WFL for tasks assessed/performed                Past Medical History:  Diagnosis Date   Anxiety    Seizures ([place])    epilepsy since age 74yrs, sz 03/20/20   SINUSITIS- ACUTE-NOS 09/25/2010   TESTICULAR MASS 11/09/2009   UTI 11/09/2009   Past Surgical History:  Procedure Laterality Date   ANKLE ARTHROSCOPY Left    DG THUMB RIGHT HAND ([place] [facility])     2-3 yrs ago 2015 or 2016   Knee surgury  03/2006   Right knee - meniscus tear/minor ACL tear   LIGAMENT REPAIR Right 05/07/2013   Procedure: cheilectomy, microfracture of metacarpal head surface right thumb;  Surgeon: Cammie Sickle., MD;  Location: [facility];  Service: Orthopedics;  Laterality: Right;   MYRINGOTOMY WITH TUBE PLACEMENT     as child   TOE SURGERY Right    Patient Active Problem List   Diagnosis Date Noted   Dislocation of right patella 07/04/2022   Vitamin D deficiency 08/11/2021   GERD (gastroesophageal reflux disease) 08/11/2021   Obesity 08/11/2021   COVID-19 virus infection 06/26/2021   Hand swelling 03/28/2021   Ganglion cyst of finger 06/16/2020   Abnormal urinalysis 04/06/2020   Renal insufficiency 04/05/2020   Hyperglycemia 04/05/2020   Protein in urine 06/15/2016   Angioedema 06/10/2014   Generalized convulsive epilepsy without mention of intractable epilepsy 03/16/2013   Generalized nonconvulsive epilepsy without mention of  intractable epilepsy 03/16/2013   Transient alteration of awareness 03/16/2013   Essential and other specified forms of tremor 03/16/2013   Encounter for long-term (current) use of other medications 03/16/2013   Head injury, unspecified 03/16/2013   Other malaise and fatigue 03/16/2013   Anxiety    Encounter for well adult exam with abnormal findings 10/27/2011    REFERRING DIAG: M25.561 (ICD-10-CM) - Right knee pain, unspecified chronicity  THERAPY DIAG:  Acute pain of right knee  Muscle weakness (generalized)  Unsteadiness on feet  Rationale for Evaluation and Treatment Rehabilitation  PERTINENT HISTORY: Anxiety, Seizures, Ankle/Knee and Thumb surgeries    PRECAUTIONS: None  SUBJECTIVE: Patient reports no current pain and HEP compliance.   PAIN:  Are you having pain?  No: NPRS scale: 0/10 - (5/10 at worst) Pain location: R medial knee Pain description: sharp, pulsating Aggravating factors: standing Relieving factors: rest   OBJECTIVE: (objective measures completed at initial evaluation unless otherwise dated)  DIAGNOSTIC FINDINGS:            See Imagining for recent MRI   PATIENT SURVEYS:  FOTO: 65% function; 82% predicted 07/21/2022: 67%   COGNITION:           Overall cognitive status: Within functional limits for tasks assessed  SENSATION: WFL   POSTURE:  medium body habitus; mild medial knee edema   PALPATION: Slight TTP to R medial knee joint line   LOWER EXTREMITY ROM:   Active ROM Right eval Left eval  Hip flexion      Hip extension      Hip abduction      Hip adduction      Hip internal rotation      Hip external rotation      Knee flexion 120    Knee extension 0    Ankle dorsiflexion      Ankle plantarflexion      Ankle inversion      Ankle eversion       (Blank rows = not tested)   LOWER EXTREMITY MMT:   MMT Right eval Left eval  Hip flexion 5/5 5/5  Hip extension      Hip abduction 4/5 5/5  Hip  adduction      Hip internal rotation      Hip external rotation      Knee flexion 4/5 5/5  Knee extension 4/5 5/5  Ankle dorsiflexion      Ankle plantarflexion      Ankle inversion      Ankle eversion       (Blank rows = not tested)   LOWER EXTREMITY SPECIAL TESTS:  DNT   FUNCTIONAL TESTS:  SLS: unable    GAIT: Distance walked: 108ft Assistive device utilized: None Level of assistance: Complete Independence Comments: antaglic gait R, decreased knee flexion R       TODAY'S TREATMENT: [facility] Adult PT Treatment:                                                DATE: 07/25/2022 Therapeutic Exercise: Rec bike lvl 3 x 5 min while taking subjective Seated knee ext 3x15 10# R only Seated hamstring curl 45# 3x10 Leg press 65# 3x10 BIL, 35# Rt only 3x10 Heel raises off 4" step 3x10 STS with 25# KB 2x10 Eccentric heel tap 2x15 R 4in Lateral walk GTB 4 laps at counter Neuromuscular Re-ed: SLS on foam 2x45" Tandem stance on foam x45" BIL   [facility] Adult PT Treatment:                                                DATE: 07/21/2022 Therapeutic Exercise: Rec bike lvl 3 x 5 min while taking subjective Seated knee ext 3x15 10# R only Seated hamstring curl 45# 3x10 Standing hip abd/ext 2x10 25# Eccentric heel tap 2x12 R 4in Lateral walk GTB 4 laps at counter Goblet squat 2x10 15#  Neuromuscular Re-ed: SLS on foam 2x45" Tandem stance on foam x45" BIL  [facility] Adult PT Treatment:                                                DATE: 07/18/2022 Therapeutic Exercise: Rec bike lvl 3 x 5 min while taking subjective Seated knee ext 3x15 10# R only Seated hamstring curl 45# 3x10 Standing hip abd/ext 2x10 25# Eccentric heel tap 2x12 R 4in Lateral walk  GTB 4 laps at counter Goblet squat 2x10 15#  Neuromuscular Re-ed: SLS on foam 2x45"   PATIENT EDUCATION:  Education details: update HEP Person educated: Patient Education method: Explanation, Demonstration, and Handouts Education  comprehension: verbalized understanding and returned demonstration     HOME EXERCISE PROGRAM: Access Code: [email or web address] URL: [email or web address] Date: 06/20/2022 Prepared by: Octavio Manns   Exercises - Supine Quadricep Sets  - 2 x daily - 7 x weekly - 3 sets - 10 reps - 5 sec hold - Active Straight Leg Raise with Quad Set  - 2 x daily - 7 x weekly - 3 sets - 10 reps - Sidelying Hip Abduction  - 2 x daily - 7 x weekly - 3 sets - 10 reps - Seated Long Arc Quad  - 2 x daily - 7 x weekly - 3 sets - 10 reps - 5 sec hold   ASSESSMENT:   CLINICAL IMPRESSION: Patient presents to PT with no current pain and reports HEP compliance. Session today continued to focus on L knee and proximal hip strengthening as well as balance training. Increased resistance and repetitions as noted with no increase in pain throughout session. He is making progress towards his LTG of balance with 45" achieved on the RLE today. Patient was able to tolerate all prescribed exercises with no adverse effects. Patient continues to benefit from skilled PT services and should be progressed as able to improve functional independence.    OBJECTIVE IMPAIRMENTS Abnormal gait, decreased activity tolerance, decreased balance, decreased strength, and pain    ACTIVITY LIMITATIONS carrying, lifting, standing, squatting, and stairs   PARTICIPATION LIMITATIONS: community activity, occupation, and yard work   PERSONAL FACTORS Past/current experiences and 1-2 comorbidities: Anxiety, Seizures, Ankle/Knee and Thumb surgeries  are also affecting patient's functional outcome.      GOALS: Goals reviewed with patient? No   SHORT TERM GOALS: Target date: 07/11/2022  Pt will be compliant and knowledgeable with initial HEP for improved comfort and carryover Baseline: initial HEP given  Goal status: MET   2.  Pt will self report R knee pain no greater than 3/10 for improved comfort and functional ability Baseline: 5/10 at  worst 9/19: 5/10 at worst Goal status: Ongoing   LONG TERM GOALS: Target date: 08/15/2022    Pt will self report R knee pain no greater than 1/10 for improved comfort and functional ability Baseline: 5/10 at worst Goal status: INITIAL   2.  Pt will improve FOTO function score to no less than 82% as proxy for functional improvement Baseline: 65% function Goal status: Progressing 07/21/22: 67%   3.  Pt will be able to hold SLS on R LE for no less than 60" for improved balance and functional mobility Baseline: unable  Goal status: Progressing 07/25/22: 45"    4.  Pt will be able to ambulate up/down 15 steps with reciprocal gait and no increase in knee pain for improved mobility and community navigation Baseline: unable Goal status: INITIAL   5.  Pt will increase 30 Second Sit to Stand rep count by MDIC (2 reps) for improved balance, strength, and functional mobility Baseline: will assess next session Goal status: INITIAL      PLAN: PT FREQUENCY: 2x/week   PT DURATION: 8 weeks   PLANNED INTERVENTIONS: Therapeutic exercises, Therapeutic activity, Neuromuscular re-education, Balance training, Gait training, Patient/Family education, Self Care, Joint mobilization, Aquatic Therapy, Dry Needling, Electrical stimulation, Cryotherapy, Moist heat, Vasopneumatic device, Manual therapy, and Re-evaluation   PLAN FOR NEXT SESSION:  assess HEP response, progress quad and proximal hip strength    Margarette Canada, PTA 07/25/2022, 10:28 AM

## 2022-07-25 ENCOUNTER — Ambulatory Visit: Payer: Medicare Other | Attending: Family Medicine

## 2022-07-25 DIAGNOSIS — M25561 Pain in right knee: Secondary | ICD-10-CM | POA: Insufficient documentation

## 2022-07-25 DIAGNOSIS — M6281 Muscle weakness (generalized): Secondary | ICD-10-CM | POA: Diagnosis not present

## 2022-07-25 DIAGNOSIS — R2681 Unsteadiness on feet: Secondary | ICD-10-CM | POA: Diagnosis not present

## 2022-08-01 ENCOUNTER — Ambulatory Visit: Payer: Medicare Other

## 2022-08-01 DIAGNOSIS — R2681 Unsteadiness on feet: Secondary | ICD-10-CM | POA: Diagnosis not present

## 2022-08-01 DIAGNOSIS — M6281 Muscle weakness (generalized): Secondary | ICD-10-CM

## 2022-08-01 DIAGNOSIS — M25561 Pain in right knee: Secondary | ICD-10-CM | POA: Diagnosis not present

## 2022-08-01 NOTE — Therapy (Signed)
OUTPATIENT PHYSICAL THERAPY TREATMENT NOTE/PROGRESS NOTE  Progress Note Reporting Period 06/20/2022 to 08/01/2022  See note below for Objective Data and Assessment of Progress/Goals.     Patient Name: Christopher Reed MRN: [id number] DOB:[birth year], 31 y.o., male Today's Date: 08/01/2022  PCP: Biagio Borg, MD  REFERRING PROVIDER: Dene Gentry, MD  END OF SESSION:   PT End of Session - 08/01/22 1446     Visit Number 10    Number of Visits 17    Date for PT Re-Evaluation 08/15/22    Authorization Type [employer] Medicare    PT Start Time 8295    PT Stop Time 1525    PT Time Calculation (min) 38 min    Activity Tolerance Patient tolerated treatment well    Behavior During Therapy WFL for tasks assessed/performed                 Past Medical History:  Diagnosis Date   Anxiety    Seizures ([place])    epilepsy since age 25yrs, sz 03/20/20   SINUSITIS- ACUTE-NOS 09/25/2010   TESTICULAR MASS 11/09/2009   UTI 11/09/2009   Past Surgical History:  Procedure Laterality Date   ANKLE ARTHROSCOPY Left    DG THUMB RIGHT HAND ([place] [facility]X)     2-3 yrs ago 2015 or 2016   Knee surgury  03/2006   Right knee - meniscus tear/minor ACL tear   LIGAMENT REPAIR Right 05/07/2013   Procedure: cheilectomy, microfracture of metacarpal head surface right thumb;  Surgeon: Cammie Sickle., MD;  Location: [facility];  Service: Orthopedics;  Laterality: Right;   MYRINGOTOMY WITH TUBE PLACEMENT     as child   TOE SURGERY Right    Patient Active Problem List   Diagnosis Date Noted   Dislocation of right patella 07/04/2022   Vitamin D deficiency 08/11/2021   GERD (gastroesophageal reflux disease) 08/11/2021   Obesity 08/11/2021   COVID-19 virus infection 06/26/2021   Hand swelling 03/28/2021   Ganglion cyst of finger 06/16/2020   Abnormal urinalysis 04/06/2020   Renal insufficiency 04/05/2020   Hyperglycemia 04/05/2020   Protein in urine 06/15/2016   Angioedema 06/10/2014    Generalized convulsive epilepsy without mention of intractable epilepsy 03/16/2013   Generalized nonconvulsive epilepsy without mention of intractable epilepsy 03/16/2013   Transient alteration of awareness 03/16/2013   Essential and other specified forms of tremor 03/16/2013   Encounter for long-term (current) use of other medications 03/16/2013   Head injury, unspecified 03/16/2013   Other malaise and fatigue 03/16/2013   Anxiety    Encounter for well adult exam with abnormal findings 10/27/2011    REFERRING DIAG: M25.561 (ICD-10-CM) - Right knee pain, unspecified chronicity  THERAPY DIAG:  Acute pain of right knee  Muscle weakness (generalized)  Unsteadiness on feet  Rationale for Evaluation and Treatment Rehabilitation  PERTINENT HISTORY: Anxiety, Seizures, Ankle/Knee and Thumb surgeries    PRECAUTIONS: None  SUBJECTIVE: Pt presents to PT with reports of increased pain compared to previous weeks. Feels like he may have overdone it last week after PT and moving his yard. Pt is ready to begin PT at this time.   PAIN:  Are you having pain?  No: NPRS scale: 5/10 - (5/10 at worst) Pain location: R medial knee Pain description: sharp, pulsating Aggravating factors: standing Relieving factors: rest   OBJECTIVE: (objective measures completed at initial evaluation unless otherwise dated)  DIAGNOSTIC FINDINGS:            See  Imagining for recent MRI   PATIENT SURVEYS:  FOTO: 65% function; 82% predicted 07/21/2022: 67%   COGNITION:           Overall cognitive status: Within functional limits for tasks assessed                          SENSATION: WFL   POSTURE:  medium body habitus; mild medial knee edema   PALPATION: Slight TTP to R medial knee joint line   LOWER EXTREMITY ROM:   Active ROM Right eval Left eval  Hip flexion      Hip extension      Hip abduction      Hip adduction      Hip internal rotation      Hip external rotation      Knee flexion 120     Knee extension 0    Ankle dorsiflexion      Ankle plantarflexion      Ankle inversion      Ankle eversion       (Blank rows = not tested)   LOWER EXTREMITY MMT:   MMT Right eval Left eval  Hip flexion 5/5 5/5  Hip extension      Hip abduction 4/5 5/5  Hip adduction      Hip internal rotation      Hip external rotation      Knee flexion 4/5 5/5  Knee extension 4/5 5/5  Ankle dorsiflexion      Ankle plantarflexion      Ankle inversion      Ankle eversion       (Blank rows = not tested)   LOWER EXTREMITY SPECIAL TESTS:  DNT   FUNCTIONAL TESTS:  SLS: 45" 30 Second Sit to Stand: 16 reps   GAIT: Distance walked: 45ft Assistive device utilized: None Level of assistance: Complete Independence Comments: antaglic gait R, decreased knee flexion R       TODAY'S TREATMENT: [facility] Adult PT Treatment:                                                DATE: 08/01/2022 Therapeutic Exercise: Rec bike lvl 3 x 4 min while taking subjective Supine SLR 2x15  Eccentric heel tap 2x10 4in R TKE GTB R x 15 Wall sit 2x20" Standing hip abd 30# 3x10 Bridge BTB 2x15 S/L hip abd 2x15 R Modalities: Gameready; R Knee; 34 deg; 10 min  [facility] Adult PT Treatment:                                                DATE: 07/25/2022 Therapeutic Exercise: Rec bike lvl 3 x 5 min while taking subjective Seated knee ext 3x15 10# R only Seated hamstring curl 45# 3x10 Leg press 65# 3x10 BIL, 35# Rt only 3x10 Heel raises off 4" step 3x10 STS with 25# KB 2x10 Eccentric heel tap 2x15 R 4in Lateral walk GTB 4 laps at counter Neuromuscular Re-ed: SLS on foam 2x45" Tandem stance on foam x45" BIL   [facility] Adult PT Treatment:  DATE: 07/21/2022 Therapeutic Exercise: Rec bike lvl 3 x 5 min while taking subjective Seated knee ext 3x15 10# R only Seated hamstring curl 45# 3x10 Standing hip abd/ext 2x10 25# Eccentric heel tap 2x12 R 4in Lateral walk GTB 4 laps at  counter Goblet squat 2x10 15#  Neuromuscular Re-ed: SLS on foam 2x45" Tandem stance on foam x45" BIL   PATIENT EDUCATION:  Education details: update HEP Person educated: Patient Education method: Explanation, Demonstration, and Handouts Education comprehension: verbalized understanding and returned demonstration     HOME EXERCISE PROGRAM: Access Code: [email or web address] URL: [email or web address] Date: 06/20/2022 Prepared by: Octavio Manns   Exercises - Supine Quadricep Sets  - 2 x daily - 7 x weekly - 3 sets - 10 reps - 5 sec hold - Active Straight Leg Raise with Quad Set  - 2 x daily - 7 x weekly - 3 sets - 10 reps - Sidelying Hip Abduction  - 2 x daily - 7 x weekly - 3 sets - 10 reps - Seated Long Arc Quad  - 2 x daily - 7 x weekly - 3 sets - 10 reps - 5 sec hold   ASSESSMENT:   CLINICAL IMPRESSION: Pt was able to complete all prescribed exercises with no adverse effect or increase in pain. Therapy focused on improving quad control and LE strength in order to decrease knee pain. Has met 30 Second Sit to Stand goal. Will continue to be seen and progressed per POC as prescribed.    OBJECTIVE IMPAIRMENTS Abnormal gait, decreased activity tolerance, decreased balance, decreased strength, and pain    ACTIVITY LIMITATIONS carrying, lifting, standing, squatting, and stairs   PARTICIPATION LIMITATIONS: community activity, occupation, and yard work   PERSONAL FACTORS Past/current experiences and 1-2 comorbidities: Anxiety, Seizures, Ankle/Knee and Thumb surgeries  are also affecting patient's functional outcome.      GOALS: Goals reviewed with patient? No   SHORT TERM GOALS: Target date: 07/11/2022  Pt will be compliant and knowledgeable with initial HEP for improved comfort and carryover Baseline: initial HEP given  Goal status: MET   2.  Pt will self report R knee pain no greater than 3/10 for improved comfort and functional ability Baseline: 5/10 at worst 9/19: 5/10  at worst Goal status: ONGOING   LONG TERM GOALS: Target date: 08/15/2022    Pt will self report R knee pain no greater than 1/10 for improved comfort and functional ability Baseline: 5/10 at worst Goal status: INITIAL   2.  Pt will improve FOTO function score to no less than 82% as proxy for functional improvement Baseline: 65% function Goal status: Progressing 07/21/22: 67%   3.  Pt will be able to hold SLS on R LE for no less than 60" for improved balance and functional mobility Baseline: unable  Goal status: Progressing 07/25/22: 45"    4.  Pt will be able to ambulate up/down 15 steps with reciprocal gait and no increase in knee pain for improved mobility and community navigation Baseline: unable Goal status: INITIAL   5.  Pt will increase 30 Second Sit to Stand rep count by MDIC (2 reps) for improved balance, strength, and functional mobility Baseline: will assess next session 08/01/2022: 16 reps Goal status: MET      PLAN: PT FREQUENCY: 2x/week   PT DURATION: 8 weeks   PLANNED INTERVENTIONS: Therapeutic exercises, Therapeutic activity, Neuromuscular re-education, Balance training, Gait training, Patient/Family education, Self Care, Joint mobilization, Aquatic Therapy, Dry Needling, Electrical stimulation, Cryotherapy, Moist heat, Vasopneumatic device,  Manual therapy, and Re-evaluation   PLAN FOR NEXT SESSION: assess HEP response, progress quad and proximal hip strength    Ward Chatters, PT 08/01/2022, 3:49 PM

## 2022-08-07 NOTE — Therapy (Addendum)
OUTPATIENT PHYSICAL THERAPY TREATMENT NOTE/DISCHARGE   PHYSICAL THERAPY DISCHARGE SUMMARY  Visits from Start of Care: 11  Current functional level related to goals / functional outcomes: See goals and objective   Remaining deficits: See goals and objective   Education / Equipment: HEP   Patient agrees to discharge. Patient goals were met. Patient is being discharged due to meeting the stated rehab goals.   Patient Name: Christopher Reed MRN: [id number] DOB:April 18, 1991, 31 y.o., male, male Today's Date: 08/08/2022  PCP: Biagio Borg, MD  REFERRING PROVIDER: Dene Gentry, MD  END OF SESSION:   PT End of Session - 08/08/22 1452     Visit Number 11    Number of Visits 17    Date for PT Re-Evaluation 08/15/22    Authorization Type [employer] Medicare    PT Start Time 1447    PT Stop Time 1517    PT Time Calculation (min) 30 min    Activity Tolerance Patient tolerated treatment well    Behavior During Therapy WFL for tasks assessed/performed                  Past Medical History:  Diagnosis Date   Anxiety    Seizures ([place])    epilepsy since age 51yrs, sz 03/20/20   SINUSITIS- ACUTE-NOS 09/25/2010   TESTICULAR MASS 11/09/2009   UTI 11/09/2009   Past Surgical History:  Procedure Laterality Date   ANKLE ARTHROSCOPY Left    DG THUMB RIGHT HAND ([facility] HX)     2-3 yrs ago 2015 or 2016   Knee surgury  03/2006   Right knee - meniscus tear/minor ACL tear   LIGAMENT REPAIR Right 05/07/2013   Procedure: cheilectomy, microfracture of metacarpal head surface right thumb;  Surgeon: Cammie Sickle., MD;  Location: [facility];  Service: Orthopedics;  Laterality: Right;   MYRINGOTOMY WITH TUBE PLACEMENT     as child   TOE SURGERY Right    Patient Active Problem List   Diagnosis Date Noted   Dislocation of right patella 07/04/2022   Vitamin D deficiency 08/11/2021   GERD (gastroesophageal reflux disease) 08/11/2021   Obesity 08/11/2021   COVID-19 virus  infection 06/26/2021   Hand swelling 03/28/2021   Ganglion cyst of finger 06/16/2020   Abnormal urinalysis 04/06/2020   Renal insufficiency 04/05/2020   Hyperglycemia 04/05/2020   Protein in urine 06/15/2016   Angioedema 06/10/2014   Generalized convulsive epilepsy without mention of intractable epilepsy 03/16/2013   Generalized nonconvulsive epilepsy without mention of intractable epilepsy 03/16/2013   Transient alteration of awareness 03/16/2013   Essential and other specified forms of tremor 03/16/2013   Encounter for long-term (current) use of other medications 03/16/2013   Head injury, unspecified 03/16/2013   Other malaise and fatigue 03/16/2013   Anxiety    Encounter for well adult exam with abnormal findings 10/27/2011    REFERRING DIAG: M25.561 (ICD-10-CM) - Right knee pain, unspecified chronicity  THERAPY DIAG:  Acute pain of right knee  Muscle weakness (generalized)  Unsteadiness on feet  Rationale for Evaluation and Treatment Rehabilitation  PERTINENT HISTORY: Anxiety, Seizures, Ankle/Knee and Thumb surgeries    PRECAUTIONS: None  SUBJECTIVE: Pt presents to PT with no current reports of pain or discomfort. Has been compliant with HEP with no adverse effect. Pt is ready to begin PT at this time.   PAIN:  Are you having pain?  No: NPRS scale: 5/10 - (5/10 at worst) Pain location: R medial knee Pain description: sharp,  pulsating Aggravating factors: standing Relieving factors: rest   OBJECTIVE: (objective measures completed at initial evaluation unless otherwise dated)  DIAGNOSTIC FINDINGS:            See Imagining for recent MRI   PATIENT SURVEYS:  FOTO: 65% function; 82% predicted 07/21/2022: 67%   COGNITION:           Overall cognitive status: Within functional limits for tasks assessed                          SENSATION: WFL   POSTURE:  medium body habitus; mild medial knee edema   PALPATION: Slight TTP to R medial knee joint line   LOWER  EXTREMITY ROM:   Active ROM Right eval Left eval  Hip flexion      Hip extension      Hip abduction      Hip adduction      Hip internal rotation      Hip external rotation      Knee flexion 120    Knee extension 0    Ankle dorsiflexion      Ankle plantarflexion      Ankle inversion      Ankle eversion       (Blank rows = not tested)   LOWER EXTREMITY MMT:   MMT Right eval Left eval  Hip flexion 5/5 5/5  Hip extension      Hip abduction 4/5 5/5  Hip adduction      Hip internal rotation      Hip external rotation      Knee flexion 4/5 5/5  Knee extension 4/5 5/5  Ankle dorsiflexion      Ankle plantarflexion      Ankle inversion      Ankle eversion       (Blank rows = not tested)   LOWER EXTREMITY SPECIAL TESTS:  DNT   FUNCTIONAL TESTS:  SLS: 60"   GAIT: Distance walked: 43ft Assistive device utilized: None Level of assistance: Complete Independence Comments: antaglic gait R, decreased knee flexion R       TODAY'S TREATMENT: [facility] Adult PT Treatment:                                                DATE: 08/08/2022 Therapeutic Exercise: Rec bike lvl 3 x 4 min while taking subjective Supine SLR x 15  S/L hip abd x 15  STS x 10 Wall sit 2x20" Lateral GTB x 4 laps Standing hip abd/ext x 15 GTB  [facility] Adult PT Treatment:                                                DATE: 08/01/2022 Therapeutic Exercise: Rec bike lvl 3 x 4 min while taking subjective Supine SLR 2x15  Eccentric heel tap 2x10 4in R TKE GTB R x 15 Wall sit 2x20" Standing hip abd 30# 3x10 Bridge BTB 2x15 S/L hip abd 2x15 R Modalities: Gameready; R Knee; 34 deg; 10 min  [facility] Adult PT Treatment:  DATE: 07/25/2022 Therapeutic Exercise: Rec bike lvl 3 x 5 min while taking subjective Seated knee ext 3x15 10# R only Seated hamstring curl 45# 3x10 Leg press 65# 3x10 BIL, 35# Rt only 3x10 Heel raises off 4" step 3x10 STS with 25# KB  2x10 Eccentric heel tap 2x15 R 4in Lateral walk GTB 4 laps at counter Neuromuscular Re-ed: SLS on foam 2x45" Tandem stance on foam x45" BIL   PATIENT EDUCATION:  Education details: update HEP Person educated: Patient Education method: Explanation, Demonstration, and Handouts Education comprehension: verbalized understanding and returned demonstration     HOME EXERCISE PROGRAM: Access Code: [email or web address] URL: [email or web address] Date: 08/08/2022 Prepared by: Octavio Manns  Exercises - Active Straight Leg Raise with Quad Set  - 3-4 x weekly - 3 sets - 15 reps - Sidelying Hip Abduction  - 3-4 x weekly - 3 sets - 15 reps - Sit to Stand Without Arm Support  - 3-4 x weekly - 3 sets - 15 reps - Wall Sit  - 3-4 x weekly - 3 sets - 20 sec hold - Single Leg Stance  - 3-4 x weekly - 2-3 reps - 30 sec hold - Side Stepping with Resistance at Ankles  - 3-4 x weekly - 4-5 sets - 10 reps - green band hold - Standing Hip Abduction with Resistance at Ankles and Counter Support  - 3-4 x weekly - 3 sets - 15 reps - Standing Hip Extension with Resistance at Ankles and Counter Support  - 3-4 x weekly - 3 sets - 15 reps - Mini Squat with Counter Support  - 3-4 x weekly - 3 sets - 10 reps   ASSESSMENT:   CLINICAL IMPRESSION: Pt was able to complete all prescribed exercises and demonstrated knowledge of HEP with no adverse effect. Over the course of PT treatment he has progressed very well, noting decreased pain and improving mobility. He has met most of his LTGs and feels like he is in a good place. HEP updated and patient will work on this at home for a few weeks, if needed he will call back if pain increases. He should continue to improve with HEP compliance with pt in agreement with current plan to discharge at this time.    OBJECTIVE IMPAIRMENTS Abnormal gait, decreased activity tolerance, decreased balance, decreased strength, and pain    ACTIVITY LIMITATIONS carrying, lifting,  standing, squatting, and stairs   PARTICIPATION LIMITATIONS: community activity, occupation, and yard work   PERSONAL FACTORS Past/current experiences and 1-2 comorbidities: Anxiety, Seizures, Ankle/Knee and Thumb surgeries  are also affecting patient's functional outcome.      GOALS: Goals reviewed with patient? No   SHORT TERM GOALS: Target date: 07/11/2022  Pt will be compliant and knowledgeable with initial HEP for improved comfort and carryover Baseline: initial HEP given  Goal status: MET   2.  Pt will self report R knee pain no greater than 3/10 for improved comfort and functional ability Baseline: 5/10 at worst 07/10/2022: 5/10 at worst 08/08/2022: 2/10 at worst Goal status: MET   LONG TERM GOALS: Target date: 08/15/2022    Pt will self report R knee pain no greater than 1/10 for improved comfort and functional ability Baseline: 5/10 at worst 08/08/2022: 2/10 at worst Goal status: MOSTLY MET   2.  Pt will improve FOTO function score to no less than 82% as proxy for functional improvement Baseline: 65% function 07/21/2022: 67% function 08/08/2022: 72% function Goal status: PARTIALLY MET   3.  Pt will  be able to hold SLS on R LE for no less than 60" for improved balance and functional mobility Baseline: unable  07/25/22: 45"  08/08/2022: 60" Goal status: MET   4.  Pt will be able to ambulate up/down 15 steps with reciprocal gait and no increase in knee pain for improved mobility and community navigation Baseline: unable Goal status: MET   5.  Pt will increase 30 Second Sit to Stand rep count by MDIC (2 reps) for improved balance, strength, and functional mobility Baseline: will assess next session 08/01/2022: 16 reps Goal status: MET      PLAN: PT FREQUENCY: 2x/week   PT DURATION: 8 weeks   PLANNED INTERVENTIONS: Therapeutic exercises, Therapeutic activity, Neuromuscular re-education, Balance training, Gait training, Patient/Family education, Self Care, Joint  mobilization, Aquatic Therapy, Dry Needling, Electrical stimulation, Cryotherapy, Moist heat, Vasopneumatic device, Manual therapy, and Re-evaluation   PLAN FOR NEXT SESSION: assess HEP response, progress quad and proximal hip strength    Ward Chatters, PT 08/08/2022, 3:23 PM

## 2022-08-08 ENCOUNTER — Ambulatory Visit: Payer: Medicare Other

## 2022-08-08 DIAGNOSIS — M25561 Pain in right knee: Secondary | ICD-10-CM | POA: Diagnosis not present

## 2022-08-08 DIAGNOSIS — R2681 Unsteadiness on feet: Secondary | ICD-10-CM

## 2022-08-08 DIAGNOSIS — M6281 Muscle weakness (generalized): Secondary | ICD-10-CM | POA: Diagnosis not present

## 2022-08-10 ENCOUNTER — Other Ambulatory Visit (INDEPENDENT_AMBULATORY_CARE_PROVIDER_SITE_OTHER): Payer: Medicare Other

## 2022-08-10 DIAGNOSIS — E559 Vitamin D deficiency, unspecified: Secondary | ICD-10-CM | POA: Diagnosis not present

## 2022-08-10 DIAGNOSIS — R739 Hyperglycemia, unspecified: Secondary | ICD-10-CM

## 2022-08-10 DIAGNOSIS — Z0001 Encounter for general adult medical examination with abnormal findings: Secondary | ICD-10-CM | POA: Diagnosis not present

## 2022-08-10 LAB — CBC WITH DIFFERENTIAL/PLATELET
Basophils Absolute: 0 10*3/uL (ref 0.0–0.1)
Basophils Relative: 1.1 % (ref 0.0–3.0)
Eosinophils Absolute: 0.2 10*3/uL (ref 0.0–0.7)
Eosinophils Relative: 4 % (ref 0.0–5.0)
HCT: 44.7 % (ref 39.0–52.0)
Hemoglobin: 15.4 g/dL (ref 13.0–17.0)
Lymphocytes Relative: 33.8 % (ref 12.0–46.0)
Lymphs Abs: 1.5 10*3/uL (ref 0.7–4.0)
MCHC: 34.3 g/dL (ref 30.0–36.0)
MCV: 94.2 fl (ref 78.0–100.0)
Monocytes Absolute: 0.4 10*3/uL (ref 0.1–1.0)
Monocytes Relative: 9.9 % (ref 3.0–12.0)
Neutro Abs: 2.2 10*3/uL (ref 1.4–7.7)
Neutrophils Relative %: 51.2 % (ref 43.0–77.0)
Platelets: 244 10*3/uL (ref 150.0–400.0)
RBC: 4.75 Mil/uL (ref 4.22–5.81)
RDW: 12.9 % (ref 11.5–15.5)
WBC: 4.3 10*3/uL (ref 4.0–10.5)

## 2022-08-10 LAB — URINALYSIS, ROUTINE W REFLEX MICROSCOPIC
Bilirubin Urine: NEGATIVE
Hgb urine dipstick: NEGATIVE
Ketones, ur: NEGATIVE
Leukocytes,Ua: NEGATIVE
Nitrite: NEGATIVE
RBC / HPF: NONE SEEN (ref 0–?)
Specific Gravity, Urine: 1.02 (ref 1.000–1.030)
Total Protein, Urine: NEGATIVE
Urine Glucose: NEGATIVE
Urobilinogen, UA: 0.2 (ref 0.0–1.0)
pH: 7 (ref 5.0–8.0)

## 2022-08-10 LAB — LIPID PANEL
Cholesterol: 145 mg/dL (ref 0–200)
HDL: 35 mg/dL — ABNORMAL LOW (ref >39.00)
LDL Cholesterol: 95 mg/dL (ref 0–99)
NonHDL: 110.13
Total CHOL/HDL Ratio: 4
Triglycerides: 76 mg/dL (ref 0.0–149.0)
VLDL: 15.2 mg/dL (ref 0.0–40.0)

## 2022-08-10 LAB — BASIC METABOLIC PANEL
BUN: 14 mg/dL (ref 6–23)
CO2: 25 mEq/L (ref 19–32)
Calcium: 9.2 mg/dL (ref 8.4–10.5)
Chloride: 103 mEq/L (ref 96–112)
Creatinine, Ser: 1.17 mg/dL (ref 0.40–1.50)
GFR: 82.9 mL/min (ref >60.00)
Glucose, Bld: 89 mg/dL (ref 70–99)
Potassium: 4.2 mEq/L (ref 3.5–5.1)
Sodium: 138 mEq/L (ref 135–145)

## 2022-08-10 LAB — HEPATIC FUNCTION PANEL
ALT: 25 U/L (ref 0–53)
AST: 18 U/L (ref 0–37)
Albumin: 4.7 g/dL (ref 3.5–5.2)
Alkaline Phosphatase: 70 U/L (ref 39–117)
Bilirubin, Direct: 0.1 mg/dL (ref 0.0–0.3)
Total Bilirubin: 0.5 mg/dL (ref 0.2–1.2)
Total Protein: 7.1 g/dL (ref 6.0–8.3)

## 2022-08-10 LAB — HEMOGLOBIN A1C: Hgb A1c MFr Bld: 5.3 % (ref 4.6–6.5)

## 2022-08-10 LAB — VITAMIN D 25 HYDROXY (VIT D DEFICIENCY, FRACTURES): VITD: 26.29 ng/mL — ABNORMAL LOW (ref 30.00–100.00)

## 2022-08-10 LAB — TSH: TSH: 1.09 u[IU]/mL (ref 0.35–5.50)

## 2022-08-15 ENCOUNTER — Encounter: Payer: Self-pay | Admitting: Internal Medicine

## 2022-08-15 ENCOUNTER — Ambulatory Visit (INDEPENDENT_AMBULATORY_CARE_PROVIDER_SITE_OTHER): Payer: Medicare Other | Admitting: Internal Medicine

## 2022-08-15 VITALS — BP 124/80 | HR 91 | Temp 98.3°F | Ht 71.0 in | Wt 237.0 lb

## 2022-08-15 DIAGNOSIS — J029 Acute pharyngitis, unspecified: Secondary | ICD-10-CM

## 2022-08-15 DIAGNOSIS — R0981 Nasal congestion: Secondary | ICD-10-CM | POA: Diagnosis not present

## 2022-08-15 DIAGNOSIS — Z0001 Encounter for general adult medical examination with abnormal findings: Secondary | ICD-10-CM

## 2022-08-15 DIAGNOSIS — E559 Vitamin D deficiency, unspecified: Secondary | ICD-10-CM | POA: Diagnosis not present

## 2022-08-15 DIAGNOSIS — R739 Hyperglycemia, unspecified: Secondary | ICD-10-CM | POA: Diagnosis not present

## 2022-08-15 DIAGNOSIS — Z23 Encounter for immunization: Secondary | ICD-10-CM

## 2022-08-15 LAB — POC COVID19 BINAXNOW: SARS Coronavirus 2 Ag: NEGATIVE

## 2022-08-15 MED ORDER — AZITHROMYCIN 250 MG PO TABS: ORAL_TABLET | ORAL | 1 refills | Status: AC

## 2022-08-15 NOTE — Patient Instructions (Signed)
Your COVID testing was done today  Please take all new medication as prescribed  - the antibiotic  You had the Tdap (tetanus) shot today  Please take OTC Vitamin D3 at 2000 units per day, indefinitely  Please continue all other medications as before, and refills have been done if requested.  Please have the pharmacy call with any other refills you may need.  Please continue your efforts at being more active, low cholesterol diet, and weight control.  You are otherwise up to date with prevention measures today.  Please keep your appointments with your specialists as you may have planned  Please make an Appointment to return for your 1 year visit, or sooner if needed, with Lab testing by Appointment as well, to be done about 3-5 days before at the [facility] (so this is for TWO appointments - please see the scheduling desk as you leave)

## 2022-08-15 NOTE — Progress Notes (Unsigned)
Patient ID: Christopher Reed, male   DOB: [birth year], 31 y.o.   MRN: [id number]         Chief Complaint:: wellness exam and acute pharyngitis, hypergycemia, low vit d       HPI:  Christopher Reed is a 31 y.o. male here for wellness exam; due for tdap, o/w up to date                        Also  Here with 2-3 days acute onset fever, facial pain, pressure, headache, general weakness and malaise, and greenish d/c, with mild ST and cough, but pt denies chest pain, wheezing, increased sob or doe, orthopnea, PND, increased LE swelling, palpitations, dizziness or syncope.  Not taking Vit D.   Pt denies polydipsia, polyuria, or new focal neuro s/s.    Pt denies other wt loss, night sweats, loss of appetite, or other constitutional symptoms  Not taking Vit D.  Very little exercise recently due to work and life demands,   Wt has been increasing with extra calories.   Wt Readings from Last 3 Encounters:  08/15/22 237 lb (107.5 kg)  06/06/22 230 lb (104.3 kg)  05/30/22 230 lb (104.3 kg)   BP Readings from Last 3 Encounters:  08/15/22 124/80  07/04/22 132/70  06/06/22 125/75   Immunization History  Administered Date(s) Administered   Influenza Split 07/26/2011, 07/31/2012   Influenza Whole 09/25/2010   Influenza,inj,Quad PF,6+ Mos 07/17/2013, 07/30/2014, 07/22/2015, 06/15/2016, 07/03/2017, 08/07/2018, 07/10/2019, 07/08/2020, 07/06/2021, 06/29/2022   Moderna Sars-Covid-2 Vaccination 12/31/2019, 02/02/2020   Td 11/09/2009   Tdap 08/15/2022   Health Maintenance Due  Topic Date Due   Medicare Annual Wellness (AWV)  08/29/2022      Past Medical History:  Diagnosis Date   Anxiety    Seizures ([place])    epilepsy since age 57yr, sz 03/20/20   SINUSITIS- ACUTE-NOS 09/25/2010   TESTICULAR MASS 11/09/2009   UTI 11/09/2009   Past Surgical History:  Procedure Laterality Date   ANKLE ARTHROSCOPY Left    DG THUMB RIGHT HAND ([facility])     2-3 yrs ago 2015 or 2016   Knee surgury  03/2006   Right knee -  meniscus tear/minor ACL tear   LIGAMENT REPAIR Right 05/07/2013   Procedure: cheilectomy, microfracture of metacarpal head surface right thumb;  Surgeon: RCammie Sickle, MD;  Location: [facility]  Service: Orthopedics;  Laterality: Right;   MYRINGOTOMY WITH TUBE PLACEMENT     as child   TOE SURGERY Right     reports that he has never smoked. He has never used smokeless tobacco. He reports that he does not drink alcohol and does not use drugs. family history includes Arthritis in an other family member; Diabetes in his father and another family member; Hepatitis C in his paternal grandmother; Hypertension in his father, mother, and another family member; Other in his maternal grandfather; Pneumonia in his paternal grandfather. Allergies  Allergen Reactions   Bee Venom     angioedema   Current Outpatient Medications on File Prior to Visit  Medication Sig Dispense Refill   EPINEPHrine (EPIPEN) 0.3 mg/0.3 mL IJ SOAJ injection Inject 0.3 mLs (0.3 mg total) into the muscle once. 2 Device 1   ethosuximide (ZARONTIN) 250 MG capsule Take 2 capsules (500 mg total) by mouth 2 (two) times daily. 360 capsule 4   LAMICTAL 200 MG tablet Take 2 tablets (400 mg total) by mouth every morning  AND 3.5 tablets (700 mg total) every evening. 500 tablet 4   No current facility-administered medications on file prior to visit.        ROS:  All others reviewed and negative.  Objective        PE:  BP 124/80   Pulse 91   Temp 98.3 F (36.8 C)   Ht '5\' 11"'$  (1.803 m)   Wt 237 lb (107.5 kg)   SpO2 98%   BMI 33.05 kg/m                 Constitutional: Pt appears in NAD               HENT: Head: NCAT.                Right Ear: External ear normal.                 Left Ear: External ear normal. Bilat tm's with mild erythema.  Max sinus areas mild tender.  Pharynx with mild erythema, no exudate               Eyes: . Pupils are equal, round, and reactive to light. Conjunctivae and EOM are  normal               Nose: without d/c or deformity               Neck: Neck supple. Gross normal ROM               Cardiovascular: Normal rate and regular rhythm.                 Pulmonary/Chest: Effort normal and breath sounds without rales or wheezing.                Abd:  Soft, NT, ND, + BS, no organomegaly               Neurological: Pt is alert. At baseline orientation, motor grossly intact               Skin: Skin is warm. No rashes, no other new lesions, LE edema - none               Psychiatric: Pt behavior is normal without agitation   Micro: none  Cardiac tracings I have personally interpreted today:  none  Pertinent Radiological findings (summarize): none   Lab Results  Component Value Date   WBC 4.3 08/10/2022   HGB 15.4 08/10/2022   HCT 44.7 08/10/2022   PLT 244.0 08/10/2022   GLUCOSE 89 08/10/2022   CHOL 145 08/10/2022   TRIG 76.0 08/10/2022   HDL 35.00 (L) 08/10/2022   LDLDIRECT 96.0 08/11/2021   LDLCALC 95 08/10/2022   ALT 25 08/10/2022   AST 18 08/10/2022   NA 138 08/10/2022   K 4.2 08/10/2022   CL 103 08/10/2022   CREATININE 1.17 08/10/2022   BUN 14 08/10/2022   CO2 25 08/10/2022   TSH 1.09 08/10/2022   HGBA1C 5.3 08/10/2022   SARS Coronavirus 2 Ag Negative Negative    Assessment/Plan:  Christopher Reed is a 31 y.o. White or Caucasian [1] male with  has a past medical history of Anxiety, Seizures ([place]), SINUSITIS- ACUTE-NOS (09/25/2010), TESTICULAR MASS (11/09/2009), and UTI (11/09/2009).  Encounter for well adult exam with abnormal findings Age and sex appropriate education and counseling updated with regular exercise and diet Referrals for preventative services - none needed Immunizations addressed - for tdap  today Smoking counseling  - none needed Evidence for depression or other mood disorder - none significant Most recent labs reviewed. I have personally reviewed and have noted: 1) the patient's medical and social history 2) The patient's  current medications and supplements 3) The patient's height, weight, and BMI have been recorded in the chart   Vitamin D deficiency Last vitamin D Lab Results  Component Value Date   VD25OH 26.29 (L) 08/10/2022   Low, to start oral replacement   Hyperglycemia Lab Results  Component Value Date   HGBA1C 5.3 08/10/2022   Stable, pt to continue current medical treatment  - diet, wt control, excercise   Acute pharyngitis Mild to mod, for antibx course zpack,  to f/u any worsening symptoms or concerns  Followup: Return in about 1 year (around 08/16/2023).  [name], MD 08/16/2022 9:17 PM [facility]

## 2022-08-16 ENCOUNTER — Encounter: Payer: Self-pay | Admitting: Internal Medicine

## 2022-08-16 DIAGNOSIS — J029 Acute pharyngitis, unspecified: Secondary | ICD-10-CM | POA: Insufficient documentation

## 2022-08-16 NOTE — Assessment & Plan Note (Signed)
Age and sex appropriate education and counseling updated with regular exercise and diet Referrals for preventative services - none needed Immunizations addressed - for tdap today Smoking counseling  - none needed Evidence for depression or other mood disorder - none significant Most recent labs reviewed. I have personally reviewed and have noted: 1) the patient's medical and social history 2) The patient's current medications and supplements 3) The patient's height, weight, and BMI have been recorded in the chart

## 2022-08-16 NOTE — Assessment & Plan Note (Signed)
Last vitamin D Lab Results  Component Value Date   VD25OH 26.29 (L) 08/10/2022   Low, to start oral replacement

## 2022-08-16 NOTE — Assessment & Plan Note (Signed)
Lab Results  Component Value Date   HGBA1C 5.3 08/10/2022   Stable, pt to continue current medical treatment  - diet, wt control, excercise

## 2022-08-16 NOTE — Assessment & Plan Note (Signed)
Mild to mod, for antibx course zpack,  to f/u any worsening symptoms or concerns 

## 2022-08-17 ENCOUNTER — Encounter: Payer: Medicare Other | Admitting: Internal Medicine

## 2022-08-30 ENCOUNTER — Ambulatory Visit (INDEPENDENT_AMBULATORY_CARE_PROVIDER_SITE_OTHER): Payer: Medicare Other

## 2022-08-30 VITALS — Ht 71.0 in | Wt 230.0 lb

## 2022-08-30 DIAGNOSIS — Z0000 Encounter for general adult medical examination without abnormal findings: Secondary | ICD-10-CM | POA: Diagnosis not present

## 2022-08-30 NOTE — Patient Instructions (Addendum)
Christopher Reed , Thank you for taking time to come for your Medicare Wellness Visit. I appreciate your ongoing commitment to your health goals. Please review the following plan we discussed and let me know if I can assist you in the future.   These are the goals we discussed:  Goals      Client understands the importance of follow-up with providers by attending scheduled visits        This is a list of the screening recommended for you and due dates:  Health Maintenance  Topic Date Due   COVID-19 Vaccine (3 - Moderna risk series) 08/31/2022*   Medicare Annual Wellness Visit  08/31/2023   Tetanus Vaccine  08/15/2032   Flu Shot  Completed   Hepatitis C Screening: USPSTF Recommendation to screen - Ages 18-79 yo.  Completed   HIV Screening  Completed   HPV Vaccine  Aged Out  *Topic was postponed. The date shown is not the original due date.    Advanced directives: NO  Conditions/risks identified: YES  Next appointment: Follow up in one year for your annual wellness visit.  Preventive Care 85-63 Years Old, Male Preventive care refers to lifestyle choices and visits with your health care provider that can promote health and wellness. Preventive care visits are also called wellness exams. What can I expect for my preventive care visit? Counseling During your preventive care visit, your health care provider may ask about your: Medical history, including: Past medical problems. Family medical history. Current health, including: Emotional well-being. Home life and relationship well-being. Sexual activity. Lifestyle, including: Alcohol, nicotine or tobacco, and drug use. Access to firearms. Diet, exercise, and sleep habits. Safety issues such as seatbelt and bike helmet use. Sunscreen use. Work and work Statistician. Physical exam Your health care provider may check your: Height and weight. These may be used to calculate your BMI (body mass index). BMI is a measurement that tells  if you are at a healthy weight. Waist circumference. This measures the distance around your waistline. This measurement also tells if you are at a healthy weight and may help predict your risk of certain diseases, such as type 2 diabetes and high blood pressure. Heart rate and blood pressure. Body temperature. Skin for abnormal spots. What immunizations do I need? Vaccines are usually given at various ages, according to a schedule. Your health care provider will recommend vaccines for you based on your age, medical history, and lifestyle or other factors, such as travel or where you work. What tests do I need? Screening Your health care provider may recommend screening tests for certain conditions. This may include: Lipid and cholesterol levels. Diabetes screening. This is done by checking your blood sugar (glucose) after you have not eaten for a while (fasting). Hepatitis B test. Hepatitis C test. HIV (human immunodeficiency virus) test. STI (sexually transmitted infection) testing, if you are at risk. Talk with your health care provider about your test results, treatment options, and if necessary, the need for more tests. Follow these instructions at home: Eating and drinking  Eat a healthy diet that includes fresh fruits and vegetables, whole grains, lean protein, and low-fat dairy products. Drink enough fluid to keep your urine pale yellow. Take vitamin and mineral supplements as recommended by your health care provider. Do not drink alcohol if your health care provider tells you not to drink. If you drink alcohol: Limit how much you have to 0-2 drinks a day. Know how much alcohol is in your drink. In  the [place] one drink equals one 12 oz bottle of beer (355 mL), one 5 oz glass of wine (148 mL), or one 1 oz glass of hard liquor (44 mL). Lifestyle Brush your teeth every morning and night with fluoride toothpaste. Floss one time each day. Exercise for at least 30 minutes 5 or more days  each week. Do not use any products that contain nicotine or tobacco. These products include cigarettes, chewing tobacco, and vaping devices, such as e-cigarettes. If you need help quitting, ask your health care provider. Do not use drugs. If you are sexually active, practice safe sex. Use a condom or other form of protection to prevent STIs. Find healthy ways to manage stress, such as: Meditation, yoga, or listening to music. Journaling. Talking to a trusted person. Spending time with friends and family. Minimize exposure to UV radiation to reduce your risk of skin cancer. Safety Always wear your seat belt while driving or riding in a vehicle. Do not drive: If you have been drinking alcohol. Do not ride with someone who has been drinking. If you have been using any mind-altering substances or drugs. While texting. When you are tired or distracted. Wear a helmet and other protective equipment during sports activities. If you have firearms in your house, make sure you follow all gun safety procedures. Seek help if you have been physically or sexually abused. What's next? Go to your health care provider once a year for an annual wellness visit. Ask your health care provider how often you should have your eyes and teeth checked. Stay up to date on all vaccines. This information is not intended to replace advice given to you by your health care provider. Make sure you discuss any questions you have with your health care provider. Document Revised: 04/05/2021 Document Reviewed: 04/05/2021 Elsevier Patient Education  [place].

## 2022-08-30 NOTE — Progress Notes (Signed)
Virtual Visit via Telephone Note  I connected with  Christopher Reed on 08/30/22 at  2:30 PM EST by telephone and verified that I am speaking with the correct person using two identifiers.  Location: Patient: Home Provider: [place] Persons participating in the virtual visit: Christopher Reed   I discussed the limitations, risks, security and privacy concerns of performing an evaluation and management service by telephone and the availability of in person appointments. The patient expressed understanding and agreed to proceed.  Interactive audio and video telecommunications were attempted between this nurse and patient, however failed, due to patient having technical difficulties OR patient did not have access to video capability.  We continued and completed visit with audio only.  Some vital signs may be absent or patient reported.   Sheral Flow, LPN  Subjective:   Christopher Reed is a 31 y.o. male who presents for Medicare Annual/Subsequent preventive examination.  Review of Systems     Cardiac Risk Factors include: advanced age (>24mn, >>10women);family history of premature cardiovascular disease     Objective:    Today's Vitals   08/30/22 1431  Weight: 230 lb (104.3 kg)  Height: '5\' 11"'$  (1.803 m)  PainSc: 0-No pain   Body mass index is 32.08 kg/m.     08/30/2022    2:34 PM 06/20/2022    9:40 AM 08/29/2021   10:20 AM 03/20/2020    1:03 PM 04/29/2013   10:03 AM  Advanced Directives  Does Patient Have a Medical Advance Directive? No No No No Patient does not have advance directive  Would patient like information on creating a medical advance directive? No - Patient declined No - Patient declined No - Patient declined No - Patient declined     Current Medications (verified) Outpatient Encounter Medications as of 08/30/2022  Medication Sig   EPINEPHrine (EPIPEN) 0.3 mg/0.3 mL IJ SOAJ injection Inject 0.3 mLs (0.3 mg total) into the muscle  once.   ethosuximide (ZARONTIN) 250 MG capsule Take 2 capsules (500 mg total) by mouth 2 (two) times daily.   LAMICTAL 200 MG tablet Take 2 tablets (400 mg total) by mouth every morning AND 3.5 tablets (700 mg total) every evening.   No facility-administered encounter medications on file as of 08/30/2022.    Allergies (verified) Bee venom   History: Past Medical History:  Diagnosis Date   Anxiety    Seizures ([place]    epilepsy since age [age]yr sz 03/20/20   SINUSITIS- ACUTE-NOS 09/25/2010   TESTICULAR MASS 11/09/2009   UTI 11/09/2009   Past Surgical History:  Procedure Laterality Date   ANKLE ARTHROSCOPY Left    DG THUMB RIGHT HAND ([facility])     2-3 yrs ago 2015 or 2016   Knee surgury  03/2006   Right knee - meniscus tear/minor ACL tear   LIGAMENT REPAIR Right 05/07/2013   Procedure: cheilectomy, microfracture of metacarpal head surface right thumb;  Surgeon: RoCammie Sickle MD;  [place]: [place];  Laterality: Right;   MYRINGOTOMY WITH TUBE PLACEMENT     as child   TOE SURGERY Right    Family History  Problem Relation Age of Onset   Diabetes Other        1st degree relative   Arthritis Other    Hypertension Other    Other Maternal Grandfather        Spinal Meningitis-Died at 6350 Hepatitis C Paternal Grandmother  Died at 3   Pneumonia Paternal Grandfather        Died at 64   Hypertension Mother    Hypertension Father    Diabetes Father        type 2   Social History   Socioeconomic History   Marital status: Single    Spouse name: Not on file   Number of children: Not on file   Years of education: Not on file   Highest education level: Not on file  Occupational History   Not on file  Tobacco Use   Smoking status: Never   Smokeless tobacco: Never  Substance and Sexual Activity   Alcohol use: No   Drug use: No   Sexual activity: Never  Other Topics Concern   Not on file  Social History Narrative   Lives  at home with parents.  Works at [employer].  HS Education.  Pt is single and no children.  Caffeine 2 cups avg when drinks.     Social Determinants of Health   Financial Resource Strain: Low Risk  (08/30/2022)   Overall Financial Resource Strain (CARDIA)    Difficulty of Paying Living Expenses: Not hard at all  Food Insecurity: No Food Insecurity (08/30/2022)   Hunger Vital Sign    Worried About Running Out of Food in the Last Year: Never true    Ran Out of Food in the Last Year: Never true  Transportation Needs: No Transportation Needs (08/30/2022)   PRAPARE - Hydrologist (Medical): No    Lack of Transportation (Non-Medical): No  Physical Activity: Inactive (08/30/2022)   Exercise Vital Sign    Days of Exercise per Week: 0 days    Minutes of Exercise per Session: 0 min  Stress: No Stress Concern Present (08/30/2022)   [place]    Feeling of Stress : Not at all  Social Connections: Moderately Integrated (08/30/2022)   Social Connection and Isolation Panel [NHANES]    Frequency of Communication with Friends and Family: More than three times a week    Frequency of Social Gatherings with Friends and Family: More than three times a week    Attends Religious Services: 1 to 4 times per year    Active Member of [employer] or Organizations: Yes    Attends Archivist Meetings: 1 to 4 times per year    Marital Status: Never married    Tobacco Counseling Counseling given: Not Answered   Clinical Intake:  Pre-visit preparation completed: Yes  Pain : No/denies pain Pain Score: 0-No pain     BMI - recorded: 32.08 Nutritional Status: BMI > 30  Obese Nutritional Risks: None Diabetes: No  How often do you need to have someone help you when you read instructions, pamphlets, or other written materials from your doctor or pharmacy?: 1 - Never What is the last grade level you  completed in school?: HSG  Diabetic? no  Interpreter Needed?: No  Information entered by :: Lisette Abu, LPN.   Activities of Daily Living    08/30/2022    2:40 PM  In your present state of health, do you have any difficulty performing the following activities:  Hearing? 0  Vision? 0  Difficulty concentrating or making decisions? 0  Walking or climbing stairs? 0  Dressing or bathing? 0  Doing errands, shopping? 0  Preparing Food and eating ? N  Using the Toilet? N  In the past  six months, have you accidently leaked urine? N  Do you have problems with loss of bowel control? N  Managing your Medications? N  Managing your Finances? N  Housekeeping or managing your Housekeeping? N    Patient Care Team: Biagio Borg, MD as PCP - General  Indicate any recent Medical Services you may have received from other than [facility] providers in the past year (date may be approximate).     Assessment:   This is a routine wellness examination for [place].  Hearing/Vision screen Hearing Screening - Comments:: Denies hearing difficulties   Vision Screening - Comments:: Wears rx glasses - up to date with routine eye exams with [place] issues and exercise activities discussed: Current Exercise Habits: Structured exercise class;The patient does not participate in regular exercise at present, Type of exercise: walking, Intensity: Moderate, Exercise limited by: psychological condition(s);orthopedic condition(s);neurologic condition(s)   Goals Addressed             This Visit's Progress    Client understands the importance of follow-up with providers by attending scheduled visits        Depression Screen    08/30/2022    2:35 PM 08/15/2022    1:15 PM 08/15/2022    1:06 PM 08/29/2021   10:18 AM 08/11/2021    1:22 PM 08/11/2021    1:06 PM 08/09/2020    2:32 PM  PHQ 2/9 Scores  PHQ - 2 Score 0 0 0 0 0 0 0  PHQ- 9 Score 0  0        Fall Risk     08/30/2022    2:34 PM 08/15/2022    1:14 PM 08/29/2021   10:21 AM 08/11/2021    1:22 PM 08/09/2020    2:32 PM  [place] in the past year? 0 0 0 0 0  Number falls in past yr: 0 0 0 0   Injury with Fall? 0 0 0 0   Risk for fall due to : No Fall Risks  No Fall Risks    Follow up Falls prevention discussed  Falls evaluation completed      FALL RISK PREVENTION PERTAINING TO THE HOME:  Any stairs in or around the home? No  If so, are there any without handrails? No  Home free of loose throw rugs in walkways, pet beds, electrical cords, etc? Yes  Adequate lighting in your home to reduce risk of falls? Yes   ASSISTIVE DEVICES UTILIZED TO PREVENT FALLS:  Life alert? No  Use of a cane, walker or w/c? No  Grab bars in the bathroom? No  Shower chair or bench in shower? No  Elevated toilet seat or a handicapped toilet? No   TIMED UP AND GO:  Was the test performed? No . Phone Visit   Cognitive Function:        08/30/2022    2:41 PM  6CIT Screen  What Year? 0 points  What month? 0 points  What time? 0 points  Count back from 20 0 points  Months in reverse 0 points  Repeat phrase 0 points  Total Score 0 points    Immunizations Immunization History  Administered Date(s) Administered   Influenza Split 07/26/2011, 07/31/2012   Influenza Whole 09/25/2010   Influenza,inj,Quad PF,6+ Mos 07/17/2013, 07/30/2014, 07/22/2015, 06/15/2016, 07/03/2017, 08/07/2018, 07/10/2019, 07/08/2020, 07/06/2021, 06/29/2022   Moderna Sars-Covid-2 Vaccination 12/31/2019, 02/02/2020   Td 11/09/2009   Tdap 08/15/2022    TDAP status:  Up to date  Flu Vaccine status: Up to date  Pneumococcal vaccine status: Declined,  Education has been provided regarding the importance of this vaccine but patient still declined. Advised may receive this vaccine at local pharmacy or Health Dept. Aware to provide a copy of the vaccination record if obtained from local pharmacy or Health Dept. Verbalized  acceptance and understanding.   Covid-19 vaccine status: Completed vaccines  Qualifies for Shingles Vaccine? Yes   Zostavax completed No   Shingrix Completed?: Yes  Screening Tests Health Maintenance  Topic Date Due   COVID-19 Vaccine (3 - Moderna risk series) 08/31/2022 (Originally 03/01/2020)   Medicare Annual Wellness (AWV)  08/31/2023   TETANUS/TDAP  08/15/2032   INFLUENZA VACCINE  Completed   Hepatitis C Screening  Completed   HIV Screening  Completed   HPV VACCINES  Aged Out    Health Maintenance  There are no preventive care reminders to display for this patient.   Colorectal cancer screening: No longer required.   Lung Cancer Screening: (Low Dose CT Chest recommended if Age 79-80 years, 30 pack-year currently smoking OR have quit w/in 15years.) does qualify.   Lung Cancer Screening Referral: 08/02/2021  Additional Screening:  Hepatitis C Screening: does qualify; Completed: Patient is a pulmonary patient.  Vision Screening: Recommended annual ophthalmology exams for early detection of glaucoma and other disorders of the eye. Is the patient up to date with their annual eye exam?  Yes  Who is the provider or what is the name of the office in which the patient attends annual eye exams?  [facility] If pt is not established with a provider, would they like to be referred to a provider to establish care? No .   Dental Screening: Recommended annual dental exams for proper oral hygiene  Community Resource Referral / Chronic Care Management: CRR required this visit?  No   CCM required this visit?  No      Plan:     I have personally reviewed and noted the following in the patient's chart:   Medical and social history Use of alcohol, tobacco or illicit drugs  Current medications and supplements including opioid prescriptions. Patient is not currently taking opioid prescriptions. Functional ability and status Nutritional status Physical  activity Advanced directives List of other physicians Hospitalizations, surgeries, and ER visits in previous 12 months Vitals Screenings to include cognitive, depression, and falls Referrals and appointments  In addition, I have reviewed and discussed with patient certain preventive protocols, quality metrics, and best practice recommendations. A written personalized care plan for preventive services as well as general preventive health recommendations were provided to patient.     Sheral Flow, LPN   14/04/8294   Nurse Notes: N/A

## 2022-11-07 ENCOUNTER — Telehealth: Payer: Self-pay | Admitting: Diagnostic Neuroimaging

## 2022-11-07 NOTE — Telephone Encounter (Signed)
Called the patient's mom back. To her best knowledge it was around 8 pm last night she was on the phone with her daughter and she heard a thud. She called out to pt and he didn't respond so she went to check on him and he was actively seizing at that time. She states this was worse grand mal seizure he has had in a long time.  Time is all a blur but she knows the seizure itself lasted longer than 2 min. She states he bit his tongue, vomited and had incontinence of urine. She states he remained in a postictal state for a while. The daughter arrived to the house at 8:30 pm and she left between 9-9:30 pm. By the time she left pt was oriented and had gotten back into bed.  Today they took the patient to the dentist and assessed tongue injury and stated nothing needed. She states pt is alert and oriented today but she noted that it takes him a little longer answer or respond to others.  Confirmed the patient is taking the medication listed on file Zarontin and Lamictal and is compliant with the medication. He normally takes am dose 6 am and pm dose at 6 pm. Last night it was closer to 6:25 before he took his medication but typically is consistent.  Advised I will bring this to Dr Gladstone Lighter attention to see if he recommends anything at this time. Pt's mom verbalized understanding

## 2022-11-07 NOTE — Telephone Encounter (Signed)
Pt's mother is asking for a call to discuss [place] Mal seizure that pt had last night, she states it is the worst he has ever had.  Pt currently at dentist with mother as a result of him biting his tongue.  Please call

## 2022-11-08 NOTE — Telephone Encounter (Signed)
Pls setup video visit. I may consider to change lamotrigine to depakote, however this would be better done in an inpatient vEEG admission for rapid switch due to possible interactions.  Other options would be to add vimpat, epidiolex or onfi.   Penni Bombard, MD 1/97/5883, 2:54 PM Certified in Neurology, Neurophysiology and Neuroimaging  [facility] [address] [place], [place] [address] [phone]

## 2022-11-12 NOTE — Telephone Encounter (Signed)
Called the mother back. She states that overall pt is doing well. She states that he has been well since the recent seizure. Advised that Dr Leta Baptist wanted the patient to have a visit to discuss potential medication chgs. I was able to work the patient in on Thursday 11/15/22 at 11:30 am with a check in of 11 am.

## 2022-11-15 ENCOUNTER — Ambulatory Visit (INDEPENDENT_AMBULATORY_CARE_PROVIDER_SITE_OTHER): Payer: 59 | Admitting: Diagnostic Neuroimaging

## 2022-11-15 ENCOUNTER — Encounter: Payer: Self-pay | Admitting: Diagnostic Neuroimaging

## 2022-11-15 VITALS — BP 141/83 | HR 87 | Ht 71.0 in | Wt 246.0 lb

## 2022-11-15 DIAGNOSIS — G40309 Generalized idiopathic epilepsy and epileptic syndromes, not intractable, without status epilepticus: Secondary | ICD-10-CM

## 2022-11-15 MED ORDER — LAMICTAL 200 MG PO TABS: ORAL_TABLET | ORAL | 4 refills | Status: DC

## 2022-11-15 MED ORDER — ETHOSUXIMIDE 250 MG PO CAPS: 500.0000 mg | ORAL_CAPSULE | Freq: Two times a day (BID) | ORAL | 4 refills | Status: DC

## 2022-11-15 NOTE — Progress Notes (Signed)
Chief Complaint  Patient presents with   Follow-up    Rm 6 with mother Sunday Spillers  Pt is well, mother called in 1/17 report recent sz. Declined having any since      History of Present Illness:  UPDATE (11/15/22, VRP): Since last visit, doing well until Nov 06, 2022, had breakthrough seizure. No missed doses. No triggering factors. No other new issues, except gradual weight gain since COVID pandemic.   UPDATE (06/04/22, VRP): Since last visit, doing well. Symptoms are stable. No seizures.Tolerating meds.    UPDATE (05/31/21, VRP): Since last visit, doing well. Symptoms are stable. No alleviating or aggravating factors. Tolerating meds. No major seizures. Very rare staring spells.  UPDATE (04/04/20, VRP): Since last visit, doing well until 03/20/2020 1 patient had unprovoked seizure.  Patient was at sister's house, in the car when all of a sudden he made a clicking sound.  Then he had stiffness and generalized convulsions.  Afterwards he was very confused and combative.  Paramedics were called patient went to the emergency room for evaluation.  CT head unremarkable.  Labs unremarkable except for hematuria and elevated creatinine.  Since then patient has returned to baseline.  No further events.  He continues to have a few daily staring spells like in the past.  He is tolerating his medications.  There has been concern about possible chronic dehydration.   UPDATE (02/16/19, VRP):  - continues with daily staring spells (3-4 days per weeks; 5-7 spells per day) - had amb EEG at [facility]; some discharges noted; no clinical events; no subclincal seizures - overall stable - not driving currently; does not plan to drive in near future  UPDATE (05/26/18, VRP): Since last visit, doing about the same. Symptoms are stable. Severity is moderate. No alleviating or aggravating factors. Tolerating anti-seizure meds. Avg ~10 staring spells per day. Not clear if these are daydreaming or petit mal sz.    PRIOR HPI  (07/08/17): 32 year old right-handed male here for evaluation of seizure disorder.   Patient had normal birth and development. At age 41 years old he had intermittent episodes of seizures. Patient would have staring spells as well as grand mal seizures. He would have intermittent muscle jerks. Over the years he was treated with Lamictal and this dose was increased over time. Last grand mal seizure was run 2014.   He was then developing continued problems with intermittent daily staring spells. Therefore he was referred to [facility] video EEG for evaluation in January 2015. During the admission his Lamictal was decreased from 600 mg twice a day down to 300 mg twice a day. Patient had several stereotypical events which were confirmed to be correlated with electrographic seizure activity. He was discharged on Lamictal 400 mg twice a day with ethosuxamide '250mg'$  twice a day. Patient was tried on Keppra but they could not tolerate side effects. Modified Atkins diet was considered. VNS was considered. In 2016 Dr. Ginny Forth raised possibility of trying Depakote to help with better seizure control, but patient and family declined due to potential side effects.   Patient also had home sleep study which ruled out sleep apnea.   Patient had a ambulatory EEG in November 2017 which showed generalized spike and wave discharges. Patient had one staring spell without EEG correlate. Therefore it was concluded that patient staring spells may not be related to epileptic discharges. Patient last seen at [facility] epilepsy clinic in April 2018.   Now patient presenting here to establish with local neurologist.  Family is concerned about his ongoing daily staring spells, tremors, joint pain, difficulty in working and living independently.    Observations/Objective:  GENERAL EXAM/CONSTITUTIONAL: Vitals:  Vitals:   11/15/22 1103  BP: (!) 141/83  Pulse: 87  Weight: 246 lb (111.6 kg)  Height: '5\' 11"'$  (1.803 m)   Body mass  index is 34.31 kg/m. Wt Readings from Last 3 Encounters:  11/15/22 246 lb (111.6 kg)  08/30/22 230 lb (104.3 kg)  08/15/22 237 lb (107.5 kg)   Patient is in no distress; well developed, nourished and groomed; neck is supple  CARDIOVASCULAR: Examination of carotid arteries is normal; no carotid bruits Regular rate and rhythm, no murmurs Examination of peripheral vascular system by observation and palpation is normal  EYES: Ophthalmoscopic exam of optic discs and posterior segments is normal; no papilledema or hemorrhages No results found.  MUSCULOSKELETAL: Gait, strength, tone, movements noted in Neurologic exam below  NEUROLOGIC: MENTAL STATUS:      No data to display         awake, alert, oriented to person, place and time recent and remote memory intact normal attention and concentration language fluent, comprehension intact, naming intact fund of knowledge appropriate  CRANIAL NERVE:  2nd - no papilledema on fundoscopic exam 2nd, 3rd, 4th, 6th - pupils equal and reactive to light, visual fields full to confrontation, extraocular muscles intact, no nystagmus 5th - facial sensation symmetric 7th - facial strength symmetric 8th - hearing intact 9th - palate elevates symmetrically, uvula midline 11th - shoulder shrug symmetric 12th - tongue protrusion midline  MOTOR:  normal bulk and tone, full strength in the BUE, BLE  SENSORY:  normal and symmetric to light touch, temperature, vibration  COORDINATION:  finger-nose-finger, fine finger movements normal  REFLEXES:  deep tendon reflexes present and symmetric  GAIT/STATION:  narrow based gait   06/07/11 EEG - frontally predominant generalized spike and wave discharges   Assessment and Plan:  32 y.o. male here with history of seizures since age 89 years old, likely primary generalized epilepsy.    Last grand mal seizures: Nov 2016, May 2021, Jan 2024   Daily staring spells.   Meds tried: lamictal,  zarontin, keppra     Dx:   1. Nonintractable generalized idiopathic epilepsy without status epilepticus ([place])        PLAN:   SEIZURE DISORDER (primary generalized epilepsy; unprovoked breakthrough in Jan 2024) - check EEG routine; may consider VEEG in future - continue lamictal '400mg'$  (2 x 200) in AM and '700mg'$  (3.5 x 200) in PM (brand medically necessary) - continue ETHOSUXAMIDE '250mg'$  twice a day - consider adding vimpat, topiramate, onfi, epidiolex - in future may consider transition to depakote, which is very effective for primary generalized epilepsy, but caution due to VPA-LTG drug interactions and also weight gain issues - not currently driving   Meds ordered this encounter  Medications   ethosuximide (ZARONTIN) 250 MG capsule    Sig: Take 2 capsules (500 mg total) by mouth 2 (two) times daily.    Dispense:  360 capsule    Refill:  4   LAMICTAL 200 MG tablet    Sig: Take 2 tablets (400 mg total) by mouth every morning AND 3.5 tablets (700 mg total) every evening.    Dispense:  500 tablet    Refill:  4   Orders Placed This Encounter  Procedures   EEG adult   Follow Up Instructions:  - Return in about 1 year (around 11/16/2023).  Penni Bombard, MD 01/02/9701, 63:78 AM Certified in Neurology, Neurophysiology and Neuroimaging  [facility] [address] [place], [place] [address] [phone]

## 2022-11-23 ENCOUNTER — Telehealth: Payer: Self-pay

## 2022-11-23 ENCOUNTER — Other Ambulatory Visit: Payer: Self-pay | Admitting: Internal Medicine

## 2022-11-23 ENCOUNTER — Ambulatory Visit (INDEPENDENT_AMBULATORY_CARE_PROVIDER_SITE_OTHER): Payer: 59 | Admitting: Internal Medicine

## 2022-11-23 VITALS — BP 130/78 | HR 83 | Temp 98.4°F | Ht 71.0 in | Wt 241.0 lb

## 2022-11-23 DIAGNOSIS — E559 Vitamin D deficiency, unspecified: Secondary | ICD-10-CM | POA: Diagnosis not present

## 2022-11-23 DIAGNOSIS — Z6832 Body mass index (BMI) 32.0-32.9, adult: Secondary | ICD-10-CM

## 2022-11-23 DIAGNOSIS — E6609 Other obesity due to excess calories: Secondary | ICD-10-CM

## 2022-11-23 DIAGNOSIS — R739 Hyperglycemia, unspecified: Secondary | ICD-10-CM | POA: Diagnosis not present

## 2022-11-23 MED ORDER — TIRZEPATIDE 2.5 MG/0.5ML ~~LOC~~ SOAJ: 2.5000 mg | SUBCUTANEOUS | 3 refills | Status: DC

## 2022-11-23 MED ORDER — ZEPBOUND 2.5 MG/0.5ML ~~LOC~~ SOAJ
2.5000 mg | SUBCUTANEOUS | 3 refills | Status: DC
Start: 2022-11-23 — End: 2023-12-18

## 2022-11-23 NOTE — Telephone Encounter (Signed)
Pts mother has called and stated pts pharmacy is informing them that pt needs a PAS for the below medication. tirzepatide (ZEPBOUND) 2.5 MG/0.5ML Pen

## 2022-11-23 NOTE — Progress Notes (Unsigned)
Patient ID: Christopher Reed, male   DOB: 05/23/1991, 32 y.o.   MRN: [id number]        Chief Complaint: follow up wt loss       HPI:  Christopher Reed is a 32 y.o. male here with mother who has had some success with wt loss with mounjaro and hx of DM, now here with son with recent persistent wt loss, though able to lose a few lbs recently with better diet and exercise.  Pt would like to add zepbound.  Pt denies chest pain, increased sob or doe, wheezing, orthopnea, PND, increased LE swelling, palpitations, dizziness or syncope.   Pt denies polydipsia, polyuria, or new focal neuro s/s, though did have recent significant seizure activity.         Wt Readings from Last 3 Encounters:  11/23/22 241 lb (109.3 kg)  11/15/22 246 lb (111.6 kg)  08/30/22 230 lb (104.3 kg)   BP Readings from Last 3 Encounters:  11/23/22 130/78  11/15/22 (!) 141/83  08/15/22 124/80         Past Medical History:  Diagnosis Date   Anxiety    Seizures ([place])    epilepsy since age 58yr, sz 03/20/20   SINUSITIS- ACUTE-NOS 09/25/2010   TESTICULAR MASS 11/09/2009   UTI 11/09/2009   Past Surgical History:  Procedure Laterality Date   ANKLE ARTHROSCOPY Left    DG THUMB RIGHT HAND ([facility])     2-3 yrs ago 2015 or 2016   Knee surgury  03/2006   Right knee - meniscus tear/minor ACL tear   LIGAMENT REPAIR Right 05/07/2013   Procedure: cheilectomy, microfracture of metacarpal head surface right thumb;  Surgeon: RCammie Sickle, MD;  Location: [facility]  Service: Orthopedics;  Laterality: Right;   MYRINGOTOMY WITH TUBE PLACEMENT     as child   TOE SURGERY Right     reports that he has never smoked. He has never used smokeless tobacco. He reports that he does not drink alcohol and does not use drugs. family history includes Arthritis in an other family member; Diabetes in his father and another family member; Hepatitis C in his paternal grandmother; Hypertension in his father, mother, and another  family member; Other in his maternal grandfather; Pneumonia in his paternal grandfather. Allergies  Allergen Reactions   Bee Venom     angioedema   Current Outpatient Medications on File Prior to Visit  Medication Sig Dispense Refill   EPINEPHrine (EPIPEN) 0.3 mg/0.3 mL IJ SOAJ injection Inject 0.3 mLs (0.3 mg total) into the muscle once. 2 Device 1   ethosuximide (ZARONTIN) 250 MG capsule Take 2 capsules (500 mg total) by mouth 2 (two) times daily. 360 capsule 4   LAMICTAL 200 MG tablet Take 2 tablets (400 mg total) by mouth every morning AND 3.5 tablets (700 mg total) every evening. 500 tablet 4   No current facility-administered medications on file prior to visit.        ROS:  All others reviewed and negative.  Objective        PE:  BP 130/78 (BP Location: Left Arm, Patient Position: Sitting, Cuff Size: Large)   Pulse 83   Temp 98.4 F (36.9 C) (Oral)   Ht '5\' 11"'$  (1.803 m)   Wt 241 lb (109.3 kg)   SpO2 98%   BMI 33.61 kg/m                 Constitutional: Pt appears in NAD  HENT: Head: NCAT.                Right Ear: External ear normal.                 Left Ear: External ear normal.                Eyes: . Pupils are equal, round, and reactive to light. Conjunctivae and EOM are normal               Nose: without d/c or deformity               Neck: Neck supple. Gross normal ROM               Cardiovascular: Normal rate and regular rhythm.                 Pulmonary/Chest: Effort normal and breath sounds without rales or wheezing.                Abd:  Soft, NT, ND, + BS, no organomegaly               Neurological: Pt is alert. At baseline orientation, motor grossly intact               Skin: Skin is warm. No rashes, no other new lesions, LE edema - none               Psychiatric: Pt behavior is normal without agitation   Micro: none  Cardiac tracings I have personally interpreted today:  none  Pertinent Radiological findings (summarize): none   Lab Results   Component Value Date   WBC 4.3 08/10/2022   HGB 15.4 08/10/2022   HCT 44.7 08/10/2022   PLT 244.0 08/10/2022   GLUCOSE 89 08/10/2022   CHOL 145 08/10/2022   TRIG 76.0 08/10/2022   HDL 35.00 (L) 08/10/2022   LDLDIRECT 96.0 08/11/2021   LDLCALC 95 08/10/2022   ALT 25 08/10/2022   AST 18 08/10/2022   NA 138 08/10/2022   K 4.2 08/10/2022   CL 103 08/10/2022   CREATININE 1.17 08/10/2022   BUN 14 08/10/2022   CO2 25 08/10/2022   TSH 1.09 08/10/2022   HGBA1C 5.3 08/10/2022   Assessment/Plan:  Christopher Reed is a 32 y.o. White or Caucasian [1] male with  has a past medical history of Anxiety, Seizures ([place]), SINUSITIS- ACUTE-NOS (09/25/2010), TESTICULAR MASS (11/09/2009), and UTI (11/09/2009).  Obesity Worsening overall despite diet and activity, now for zepbound 2.5 mg qeekly  Hyperglycemia Lab Results  Component Value Date   HGBA1C 5.3 08/10/2022   Stable, pt to continue current medical treatment  - zepbound 2.5 mg weekly,, should also help with sugar control   Vitamin D deficiency Last vitamin D Lab Results  Component Value Date   VD25OH 26.29 (L) 08/10/2022   Low, to start oral replacement  Followup: Return in about 6 months (around 05/24/2023).  [name], MD 11/24/2022 6:05 PM [facility]

## 2022-11-23 NOTE — Patient Instructions (Signed)
Please take all new medication as prescribed - the zepbound  Please continue all other medications as before, and refills have been done if requested.  Please have the pharmacy call with any other refills you may need.  Please continue your efforts at being more active, low cholesterol diet, and weight control..  Please keep your appointments with your specialists as you may have planned  Please make an Appointment to return in 6 months, or sooner if needed

## 2022-11-23 NOTE — Telephone Encounter (Signed)
Pt need PA for [facility]. Submitted PA w/(Key: Freda Jackson PA will not go through.  PA for Zepbound submitted w/ (Key: [id number]) [facility] is reviewing your PA request../lmb

## 2022-11-24 ENCOUNTER — Encounter: Payer: Self-pay | Admitting: Internal Medicine

## 2022-11-24 NOTE — Assessment & Plan Note (Signed)
Worsening overall despite diet and activity, now for zepbound 2.5 mg Christopher Reed

## 2022-11-24 NOTE — Assessment & Plan Note (Signed)
Last vitamin D Lab Results  Component Value Date   VD25OH 26.29 (L) 08/10/2022   Low, to start oral replacement

## 2022-11-24 NOTE — Assessment & Plan Note (Signed)
Lab Results  Component Value Date   HGBA1C 5.3 08/10/2022   Stable, pt to continue current medical treatment  - zepbound 2.5 mg weekly,, should also help with sugar control

## 2022-11-26 DIAGNOSIS — H6063 Unspecified chronic otitis externa, bilateral: Secondary | ICD-10-CM | POA: Diagnosis not present

## 2022-11-26 DIAGNOSIS — J301 Allergic rhinitis due to pollen: Secondary | ICD-10-CM | POA: Diagnosis not present

## 2022-11-26 DIAGNOSIS — J342 Deviated nasal septum: Secondary | ICD-10-CM | POA: Diagnosis not present

## 2022-11-28 ENCOUNTER — Ambulatory Visit (INDEPENDENT_AMBULATORY_CARE_PROVIDER_SITE_OTHER): Payer: 59 | Admitting: Diagnostic Neuroimaging

## 2022-11-28 DIAGNOSIS — G40309 Generalized idiopathic epilepsy and epileptic syndromes, not intractable, without status epilepticus: Secondary | ICD-10-CM

## 2022-12-04 NOTE — Procedures (Signed)
   [facility]  EEG (ELECTROENCEPHALOGRAM) REPORT   STUDY DATE: 11/28/22 PATIENT NAME: Christopher Reed DOB: [birth year] MRN: [id number]  ORDERING CLINICIAN: Andrey Spearman, MD   TECHNOLOGIST: Myer Peer TECHNIQUE: Electroencephalogram was recorded utilizing standard 10-20 system of lead placement and reformatted into average and bipolar montages.  RECORDING TIME: 25 minutes ACTIVATION: hyperventilation and photic stimulation  CLINICAL INFORMATION: 32 year old male with seizures.  FINDINGS: Posterior dominant background rhythms, which attenuate with eye opening, ranging 10-11 hertz and 20-30 microvolts. No focal, lateralizing, epileptiform activity or seizures are seen. Patient recorded in the awake and drowsy state. EKG channel shows regular rhythm of 60-65 beats per minute.   IMPRESSION:   Normal EEG in the drowsy states.   INTERPRETING PHYSICIAN:  Penni Bombard, MD Certified in Neurology, Neurophysiology and Neuroimaging  [facility] [address] [place], [place], [place] [address] [phone]

## 2022-12-05 ENCOUNTER — Telehealth: Payer: Self-pay

## 2022-12-05 NOTE — Telephone Encounter (Signed)
Contacted pt, informed him EEG results came back normal.  Advised to call the office with any questions or concerns as he had none at this time and was appreciative.

## 2022-12-05 NOTE — Telephone Encounter (Signed)
-----   Message from Penni Bombard, MD sent at 12/04/2022  5:57 PM EST ----- Normal EEG. Please call patient. Continue current plan. -VRP

## 2023-01-07 DIAGNOSIS — J301 Allergic rhinitis due to pollen: Secondary | ICD-10-CM | POA: Diagnosis not present

## 2023-01-07 DIAGNOSIS — J342 Deviated nasal septum: Secondary | ICD-10-CM | POA: Diagnosis not present

## 2023-02-06 ENCOUNTER — Telehealth: Payer: Self-pay | Admitting: Diagnostic Neuroimaging

## 2023-02-06 NOTE — Telephone Encounter (Signed)
Pt mother called. Stated pt can't take genetic LAMICTAL 200 MG tablet that was sent to the pharmacy. She is requesting a call back from nurse to discuss.  [facility] [phone]

## 2023-02-06 NOTE — Telephone Encounter (Signed)
Called pt. Informed him of message nurse [facility] sent. York Spaniel said its weird I got brand name at first and now they are trying to give me a generic. Pt said he was going to follow-up with [facility] today.

## 2023-02-06 NOTE — Telephone Encounter (Signed)
If patient calls back. The script is for brand that was sent in. He may need a prior authorization for the brand name medication and if that is the case we just need to confirm insurance is correct on file.

## 2023-02-06 NOTE — Telephone Encounter (Signed)
PA submitted on CMM KEY [id number] Received immediate response that the PA has been approved for the patient until 10/22/23  "medication or product was previously approved on [id number] from 2022-10-22 to 2023-10-22"

## 2023-03-20 ENCOUNTER — Encounter: Payer: Self-pay | Admitting: Diagnostic Neuroimaging

## 2023-03-20 ENCOUNTER — Telehealth: Payer: Self-pay | Admitting: Diagnostic Neuroimaging

## 2023-03-20 NOTE — Telephone Encounter (Signed)
Sent mychart msg and letter in mail informing pt of appt change due to provider schedule change 

## 2023-03-22 ENCOUNTER — Other Ambulatory Visit: Payer: Self-pay

## 2023-03-22 MED ORDER — EPINEPHRINE 0.3 MG/0.3ML IJ SOAJ: 0.3000 mg | Freq: Once | INTRAMUSCULAR | 0 refills | Status: AC

## 2023-04-17 ENCOUNTER — Encounter: Payer: Self-pay | Admitting: Internal Medicine

## 2023-04-17 ENCOUNTER — Ambulatory Visit (INDEPENDENT_AMBULATORY_CARE_PROVIDER_SITE_OTHER): Payer: 59 | Admitting: Internal Medicine

## 2023-04-17 VITALS — BP 120/64 | HR 80 | Temp 98.7°F | Ht 71.0 in | Wt 235.0 lb

## 2023-04-17 DIAGNOSIS — S80262A Insect bite (nonvenomous), left knee, initial encounter: Secondary | ICD-10-CM | POA: Diagnosis not present

## 2023-04-17 DIAGNOSIS — W57XXXA Bitten or stung by nonvenomous insect and other nonvenomous arthropods, initial encounter: Secondary | ICD-10-CM

## 2023-04-17 DIAGNOSIS — F419 Anxiety disorder, unspecified: Secondary | ICD-10-CM

## 2023-04-17 DIAGNOSIS — R739 Hyperglycemia, unspecified: Secondary | ICD-10-CM | POA: Diagnosis not present

## 2023-04-17 DIAGNOSIS — E559 Vitamin D deficiency, unspecified: Secondary | ICD-10-CM

## 2023-04-17 MED ORDER — DOXYCYCLINE HYCLATE 100 MG PO TABS: 100.0000 mg | ORAL_TABLET | Freq: Two times a day (BID) | ORAL | 0 refills | Status: DC

## 2023-04-17 NOTE — Patient Instructions (Signed)
Please take all new medication as prescribed - the antibiotic  Please continue all other medications as before, and refills have been done if requested.  Please have the pharmacy call with any other refills you may need.  Please keep your appointments with your specialists as you may have planned   

## 2023-04-20 ENCOUNTER — Encounter: Payer: Self-pay | Admitting: Internal Medicine

## 2023-04-20 NOTE — Assessment & Plan Note (Signed)
Last vitamin D Lab Results  Component Value Date   VD25OH 26.29 (L) 08/10/2022   Low, to start oral replacement  

## 2023-04-20 NOTE — Assessment & Plan Note (Signed)
Mild to mod, for antibx course - doxcycline100 bid,  to f/u any worsening symptoms or concerns

## 2023-04-20 NOTE — Assessment & Plan Note (Signed)
Mild situational worsening, declines need for change in tx or counseling referral

## 2023-04-20 NOTE — Progress Notes (Signed)
Patient ID: Christopher Reed, male   DOB: [birth year], 32 y.o.   MRN: [id number]        Chief Complaint: follow up left post knee tick bite infected, hyperglycemia, low vit d,anxiety       HPI:  Christopher Reed is a 32 y.o. male here with c/o 3 days onset red tender area to post left knee getting worse , without fever chills.  Pt denies chest pain, increased sob or doe, wheezing, orthopnea, PND, increased LE swelling, palpitations, dizziness or syncope.   Pt denies polydipsia, polyuria, or new focal neuro s/s.    Pt denies fever, wt loss, night sweats, loss of appetite, or other constitutional symptoms  Denies worsening depressive symptoms, suicidal ideation, or panic; has ongoing anxiety,        Wt Readings from Last 3 Encounters:  04/17/23 235 lb (106.6 kg)  11/23/22 241 lb (109.3 kg)  11/15/22 246 lb (111.6 kg)   BP Readings from Last 3 Encounters:  04/17/23 120/64  11/23/22 130/78  11/15/22 (!) 141/83         Past Medical History:  Diagnosis Date   Anxiety    Seizures ([facility])    epilepsy since age 41yrs, sz 03/20/20   SINUSITIS- ACUTE-NOS 09/25/2010   TESTICULAR MASS 11/09/2009   UTI 11/09/2009   Past Surgical History:  Procedure Laterality Date   ANKLE ARTHROSCOPY Left    DG THUMB RIGHT HAND ([facility] HX)     2-3 yrs ago 2015 or 2016   Knee surgury  03/2006   Right knee - meniscus tear/minor ACL tear   LIGAMENT REPAIR Right 05/07/2013   Procedure: cheilectomy, microfracture of metacarpal head surface right thumb;  Surgeon: Wyn Forster., MD;  Location: [facility];  Service: Orthopedics;  Laterality: Right;   MYRINGOTOMY WITH TUBE PLACEMENT     as child   TOE SURGERY Right     reports that he has never smoked. He has never used smokeless tobacco. He reports that he does not drink alcohol and does not use drugs. family history includes Arthritis in an other family member; Diabetes in his father and another family member; Hepatitis C in his paternal grandmother;  Hypertension in his father, mother, and another family member; Other in his maternal grandfather; Pneumonia in his paternal grandfather. Allergies  Allergen Reactions   Bee Venom     angioedema   Current Outpatient Medications on File Prior to Visit  Medication Sig Dispense Refill   ethosuximide (ZARONTIN) 250 MG capsule Take 2 capsules (500 mg total) by mouth 2 (two) times daily. 360 capsule 4   LAMICTAL 200 MG tablet Take 2 tablets (400 mg total) by mouth every morning AND 3.5 tablets (700 mg total) every evening. 500 tablet 4   tirzepatide (MOUNJARO) 2.5 MG/0.5ML Pen Inject 2.5 mg into the skin once a week. 6 mL 3   tirzepatide (ZEPBOUND) 2.5 MG/0.5ML Pen Inject 2.5 mg into the skin once a week. 6 mL 3   No current facility-administered medications on file prior to visit.        ROS:  All others reviewed and negative.  Objective        PE:  BP 120/64 (BP Location: Right Arm, Patient Position: Sitting, Cuff Size: Normal)   Pulse 80   Temp 98.7 F (37.1 C) (Oral)   Ht 5\' 11"  (1.803 m)   Wt 235 lb (106.6 kg)   SpO2 98%   BMI 32.78 kg/m  Constitutional: Pt appears in NAD               HENT: Head: NCAT.                Right Ear: External ear normal.                 Left Ear: External ear normal.                Eyes: . Pupils are equal, round, and reactive to light. Conjunctivae and EOM are normal               Nose: without d/c or deformity               Neck: Neck supple. Gross normal ROM               Cardiovascular: Normal rate and regular rhythm.                 Pulmonary/Chest: Effort normal and breath sounds without rales or wheezing.                Neurological: Pt is alert. At baseline orientation, motor grossly intact               Skin: Skin is warm. LE edema - none, left post knee with 2 cm area red, tender swelling               Psychiatric: Pt behavior is normal without agitation , mild nervous  Micro: none  Cardiac tracings I have personally  interpreted today:  none  Pertinent Radiological findings (summarize): none   Lab Results  Component Value Date   WBC 4.3 08/10/2022   HGB 15.4 08/10/2022   HCT 44.7 08/10/2022   PLT 244.0 08/10/2022   GLUCOSE 89 08/10/2022   CHOL 145 08/10/2022   TRIG 76.0 08/10/2022   HDL 35.00 (L) 08/10/2022   LDLDIRECT 96.0 08/11/2021   LDLCALC 95 08/10/2022   ALT 25 08/10/2022   AST 18 08/10/2022   NA 138 08/10/2022   K 4.2 08/10/2022   CL 103 08/10/2022   CREATININE 1.17 08/10/2022   BUN 14 08/10/2022   CO2 25 08/10/2022   TSH 1.09 08/10/2022   HGBA1C 5.3 08/10/2022   Assessment/Plan:  Christopher Reed is a 32 y.o. White or Caucasian [1] male with  has a past medical history of Anxiety, Seizures ([facility]), SINUSITIS- ACUTE-NOS (09/25/2010), TESTICULAR MASS (11/09/2009), and UTI (11/09/2009).  Hyperglycemia Lab Results  Component Value Date   HGBA1C 5.3 08/10/2022   Stable, pt to continue current medical treatment mounjaro 2.5 q wk   Anxiety Mild situational worsening, declines need for change in tx or counseling referral  Vitamin D deficiency Last vitamin D Lab Results  Component Value Date   VD25OH 26.29 (L) 08/10/2022   Low, to start oral replacement   Tick bite, infected Mild to mod, for antibx course - doxcycline100 bid,  to f/u any worsening symptoms or concerns  Followup: Return if symptoms worsen or fail to improve.  Oliver Barre, MD 04/20/2023 3:21 PM [facility]

## 2023-04-20 NOTE — Assessment & Plan Note (Signed)
Lab Results  Component Value Date   HGBA1C 5.3 08/10/2022   Stable, pt to continue current medical treatment mounjaro 2.5 q wk

## 2023-05-21 ENCOUNTER — Ambulatory Visit (INDEPENDENT_AMBULATORY_CARE_PROVIDER_SITE_OTHER): Payer: 59 | Admitting: Sports Medicine

## 2023-05-21 ENCOUNTER — Encounter: Payer: Self-pay | Admitting: Sports Medicine

## 2023-05-21 VITALS — BP 122/80 | HR 93 | Ht 71.0 in | Wt 235.0 lb

## 2023-05-21 DIAGNOSIS — M25531 Pain in right wrist: Secondary | ICD-10-CM

## 2023-05-21 NOTE — Progress Notes (Signed)
   Subjective:    Patient ID: Christopher Reed, male    DOB: 06-Oct-1991, 32 y.o.   MRN: [id number]  HPI chief complaint: Right arm pain  Merwin presents today with pain along the dorsum right forearm.  His pain began acutely this past weekend.  He felt a rubber band type snap in the dorsal forearm followed by an electric type shock down into his hand.  Since then, he has had a pins-and-needles feeling in the dorsal forearm and into the hand.  The intensity fluctuates.  He denies any similar issues in the past.    Review of Systems As above   Objective:   Physical Exam   Well-developed, well-nourished.  No acute distress  Right elbow: Full range of motion.  No effusion. Right forearm: No palpable defect.  No ecchymosis.  No swelling.  There is a positive Tinel's at the mid forearm dorsally.  Negative Tinel's at the cubital tunnel.  Good strength.     Assessment & Plan:   Right forearm pain likely secondary to radial nerve neuritis  I discussed a period of watchful waiting versus a trial of prednisone or Neurontin.  We decided to give this a few days in hopes that this will settle down on its own.  If not, Omran will contact me and we will revisit the idea of a 6-day Sterapred Dosepak and Neurontin at night.  Otherwise, follow-up as needed.  This note was dictated using Dragon naturally speaking software and may contain errors in syntax, spelling, or content which have not been identified prior to signing this note.

## 2023-07-02 ENCOUNTER — Ambulatory Visit (INDEPENDENT_AMBULATORY_CARE_PROVIDER_SITE_OTHER): Payer: 59 | Admitting: Radiology

## 2023-07-02 DIAGNOSIS — Z23 Encounter for immunization: Secondary | ICD-10-CM | POA: Diagnosis not present

## 2023-07-02 NOTE — Progress Notes (Signed)
Patient here for flu shot. Patient tolerated well with no complications.

## 2023-08-21 ENCOUNTER — Encounter: Payer: 59 | Admitting: Internal Medicine

## 2023-09-06 ENCOUNTER — Ambulatory Visit (INDEPENDENT_AMBULATORY_CARE_PROVIDER_SITE_OTHER): Payer: 59 | Admitting: Internal Medicine

## 2023-09-06 ENCOUNTER — Encounter: Payer: Self-pay | Admitting: Internal Medicine

## 2023-09-06 VITALS — BP 136/78 | HR 70 | Temp 98.1°F | Ht 71.0 in | Wt 236.0 lb

## 2023-09-06 DIAGNOSIS — E559 Vitamin D deficiency, unspecified: Secondary | ICD-10-CM

## 2023-09-06 DIAGNOSIS — Z0001 Encounter for general adult medical examination with abnormal findings: Secondary | ICD-10-CM

## 2023-09-06 DIAGNOSIS — R739 Hyperglycemia, unspecified: Secondary | ICD-10-CM

## 2023-09-06 DIAGNOSIS — Z0000 Encounter for general adult medical examination without abnormal findings: Secondary | ICD-10-CM

## 2023-09-06 LAB — URINALYSIS, ROUTINE W REFLEX MICROSCOPIC
Bilirubin Urine: NEGATIVE
Hgb urine dipstick: NEGATIVE
Ketones, ur: NEGATIVE
Leukocytes,Ua: NEGATIVE
Nitrite: NEGATIVE
RBC / HPF: NONE SEEN (ref 0–?)
Specific Gravity, Urine: 1.01 (ref 1.000–1.030)
Total Protein, Urine: NEGATIVE
Urine Glucose: NEGATIVE
Urobilinogen, UA: 0.2 (ref 0.0–1.0)
WBC, UA: NONE SEEN (ref 0–?)
pH: 7 (ref 5.0–8.0)

## 2023-09-06 LAB — CBC WITH DIFFERENTIAL/PLATELET
Basophils Absolute: 0 10*3/uL (ref 0.0–0.1)
Basophils Relative: 0.8 % (ref 0.0–3.0)
Eosinophils Absolute: 0.2 10*3/uL (ref 0.0–0.7)
Eosinophils Relative: 3.2 % (ref 0.0–5.0)
HCT: 45.6 % (ref 39.0–52.0)
Hemoglobin: 15.6 g/dL (ref 13.0–17.0)
Lymphocytes Relative: 27.9 % (ref 12.0–46.0)
Lymphs Abs: 1.6 10*3/uL (ref 0.7–4.0)
MCHC: 34.3 g/dL (ref 30.0–36.0)
MCV: 97.5 fL (ref 78.0–100.0)
Monocytes Absolute: 0.5 10*3/uL (ref 0.1–1.0)
Monocytes Relative: 8.2 % (ref 3.0–12.0)
Neutro Abs: 3.5 10*3/uL (ref 1.4–7.7)
Neutrophils Relative %: 59.9 % (ref 43.0–77.0)
Platelets: 287 10*3/uL (ref 150.0–400.0)
RBC: 4.68 Mil/uL (ref 4.22–5.81)
RDW: 12.6 % (ref 11.5–15.5)
WBC: 5.8 10*3/uL (ref 4.0–10.5)

## 2023-09-06 LAB — HEPATIC FUNCTION PANEL
ALT: 33 U/L (ref 0–53)
AST: 21 U/L (ref 0–37)
Albumin: 4.9 g/dL (ref 3.5–5.2)
Alkaline Phosphatase: 71 U/L (ref 39–117)
Bilirubin, Direct: 0.1 mg/dL (ref 0.0–0.3)
Total Bilirubin: 0.6 mg/dL (ref 0.2–1.2)
Total Protein: 7.5 g/dL (ref 6.0–8.3)

## 2023-09-06 LAB — BASIC METABOLIC PANEL
BUN: 13 mg/dL (ref 6–23)
CO2: 30 meq/L (ref 19–32)
Calcium: 10 mg/dL (ref 8.4–10.5)
Chloride: 101 meq/L (ref 96–112)
Creatinine, Ser: 1.1 mg/dL (ref 0.40–1.50)
GFR: 88.6 mL/min (ref >60.00)
Glucose, Bld: 90 mg/dL (ref 70–99)
Potassium: 3.8 meq/L (ref 3.5–5.1)
Sodium: 138 meq/L (ref 135–145)

## 2023-09-06 LAB — LIPID PANEL
Cholesterol: 172 mg/dL (ref 0–200)
HDL: 32.5 mg/dL — ABNORMAL LOW (ref >39.00)
LDL Cholesterol: 98 mg/dL (ref 0–99)
NonHDL: 139.4
Total CHOL/HDL Ratio: 5
Triglycerides: 206 mg/dL — ABNORMAL HIGH (ref 0.0–149.0)
VLDL: 41.2 mg/dL — ABNORMAL HIGH (ref 0.0–40.0)

## 2023-09-06 LAB — VITAMIN D 25 HYDROXY (VIT D DEFICIENCY, FRACTURES): VITD: 29.79 ng/mL — ABNORMAL LOW (ref 30.00–100.00)

## 2023-09-06 LAB — HEMOGLOBIN A1C: Hgb A1c MFr Bld: 5.1 % (ref 4.6–6.5)

## 2023-09-06 LAB — TSH: TSH: 0.81 u[IU]/mL (ref 0.35–5.50)

## 2023-09-06 NOTE — Progress Notes (Unsigned)
Patient ID: Christopher Reed, male   DOB: 04-Nov-1990, 32 y.o.   MRN: [id number]         Chief Complaint:: wellness exam and low vit d, hyperglycemia       HPI:  Christopher Reed is a 32 y.o. male here for wellness exam; declines covid booster, o/w up today                        Also Pt denies chest pain, increased sob or doe, wheezing, orthopnea, PND, increased LE swelling, palpitations, dizziness or syncope.   Pt denies polydipsia, polyuria, or new focal neuro s/s.    Pt denies fever, wt loss, night sweats, loss of appetite, or other constitutional symptoms     Wt Readings from Last 3 Encounters:  09/06/23 236 lb (107 kg)  05/21/23 235 lb (106.6 kg)  04/17/23 235 lb (106.6 kg)   BP Readings from Last 3 Encounters:  09/06/23 136/78  05/21/23 122/80  04/17/23 120/64   Immunization History  Administered Date(s) Administered   Influenza Split 07/26/2011, 07/31/2012   Influenza Whole 09/25/2010   Influenza, Seasonal, Injecte, Preservative Fre 07/02/2023   Influenza,inj,Quad PF,6+ Mos 07/17/2013, 07/30/2014, 07/22/2015, 06/15/2016, 07/03/2017, 08/07/2018, 07/10/2019, 07/08/2020, 07/06/2021, 06/29/2022   Moderna Sars-Covid-2 Vaccination 12/31/2019, 02/02/2020   Td 11/09/2009   Tdap 08/15/2022   Health Maintenance Due  Topic Date Due   COVID-19 Vaccine (3 - Moderna risk series) 03/01/2020   Medicare Annual Wellness (AWV)  08/31/2023      Past Medical History:  Diagnosis Date   Anxiety    Seizures ([facility])    epilepsy since age 23yrs, sz 03/20/20   SINUSITIS- ACUTE-NOS 09/25/2010   TESTICULAR MASS 11/09/2009   UTI 11/09/2009   Past Surgical History:  Procedure Laterality Date   ANKLE ARTHROSCOPY Left    DG THUMB RIGHT HAND ([facility] HX)     2-3 yrs ago 2015 or 2016   Knee surgury  03/2006   Right knee - meniscus tear/minor ACL tear   LIGAMENT REPAIR Right 05/07/2013   Procedure: cheilectomy, microfracture of metacarpal head surface right thumb;  Surgeon: Wyn Forster., MD;   Location: [facility];  Service: Orthopedics;  Laterality: Right;   MYRINGOTOMY WITH TUBE PLACEMENT     as child   TOE SURGERY Right     reports that he has never smoked. He has never used smokeless tobacco. He reports that he does not drink alcohol and does not use drugs. family history includes Arthritis in an other family member; Diabetes in his father and another family member; Hepatitis C in his paternal grandmother; Hypertension in his father, mother, and another family member; Other in his maternal grandfather; Pneumonia in his paternal grandfather. Allergies  Allergen Reactions   Bee Venom     angioedema   Current Outpatient Medications on File Prior to Visit  Medication Sig Dispense Refill   doxycycline (VIBRA-TABS) 100 MG tablet Take 1 tablet (100 mg total) by mouth 2 (two) times daily. 20 tablet 0   EPINEPHrine 0.3 mg/0.3 mL IJ SOAJ injection Inject into the muscle.     ethosuximide (ZARONTIN) 250 MG capsule Take 2 capsules (500 mg total) by mouth 2 (two) times daily. 360 capsule 4   LAMICTAL 200 MG tablet Take 2 tablets (400 mg total) by mouth every morning AND 3.5 tablets (700 mg total) every evening. 500 tablet 4   tirzepatide (MOUNJARO) 2.5 MG/0.5ML Pen Inject 2.5 mg into the skin  once a week. 6 mL 3   tirzepatide (ZEPBOUND) 2.5 MG/0.5ML Pen Inject 2.5 mg into the skin once a week. 6 mL 3   No current facility-administered medications on file prior to visit.        ROS:  All others reviewed and negative.  Objective        PE:  BP 136/78 (BP Location: Right Arm, Patient Position: Sitting, Cuff Size: Normal)   Pulse 70   Temp 98.1 F (36.7 C) (Oral)   Ht 5\' 11"  (1.803 m)   Wt 236 lb (107 kg)   SpO2 99%   BMI 32.92 kg/m                 Constitutional: Pt appears in NAD               HENT: Head: NCAT.                Right Ear: External ear normal.                 Left Ear: External ear normal.                Eyes: . Pupils are equal, round, and  reactive to light. Conjunctivae and EOM are normal               Nose: without d/c or deformity               Neck: Neck supple. Gross normal ROM               Cardiovascular: Normal rate and regular rhythm.                 Pulmonary/Chest: Effort normal and breath sounds without rales or wheezing.                Abd:  Soft, NT, ND, + BS, no organomegaly               Neurological: Pt is alert. At baseline orientation, motor grossly intact               Skin: Skin is warm. No rashes, no other new lesions, LE edema - none               Psychiatric: Pt behavior is normal without agitation   Micro: none  Cardiac tracings I have personally interpreted today:  none  Pertinent Radiological findings (summarize): none   Lab Results  Component Value Date   WBC 5.8 09/06/2023   HGB 15.6 09/06/2023   HCT 45.6 09/06/2023   PLT 287.0 09/06/2023   GLUCOSE 90 09/06/2023   CHOL 172 09/06/2023   TRIG 206.0 (H) 09/06/2023   HDL 32.50 (L) 09/06/2023   LDLDIRECT 96.0 08/11/2021   LDLCALC 98 09/06/2023   ALT 33 09/06/2023   AST 21 09/06/2023   NA 138 09/06/2023   K 3.8 09/06/2023   CL 101 09/06/2023   CREATININE 1.10 09/06/2023   BUN 13 09/06/2023   CO2 30 09/06/2023   TSH 0.81 09/06/2023   HGBA1C 5.1 09/06/2023   Assessment/Plan:  Christopher Reed is a 32 y.o. White or Caucasian [1] male with  has a past medical history of Anxiety, Seizures ([facility]), SINUSITIS- ACUTE-NOS (09/25/2010), TESTICULAR MASS (11/09/2009), and UTI (11/09/2009).  Encounter for well adult exam with abnormal findings Age and sex appropriate education and counseling updated with regular exercise and diet Referrals for preventative services - none needed Immunizations addressed - decliens covid  booster Smoking counseling  - none needed Evidence for depression or other mood disorder - none significant Most recent labs reviewed. I have personally reviewed and have noted: 1) the patient's medical and social history 2) The  patient's current medications and supplements 3) The patient's height, weight, and BMI have been recorded in the chart   Vitamin D deficiency Last vitamin D Lab Results  Component Value Date   VD25OH 29.79 (L) 09/06/2023   Low, to start oral replacement   Hyperglycemia Lab Results  Component Value Date   HGBA1C 5.1 09/06/2023   Stable, pt to continue current medical treatment mounjaro 2.5 mg weekly  Followup: Return in about 1 year (around 09/05/2024).  Oliver Barre, MD 09/08/2023 6:16 PM [facility]

## 2023-09-06 NOTE — Progress Notes (Signed)
The test results show that your current treatment is OK, as the tests are stable.  Please continue the same plan.  There is no other need for change of treatment or further evaluation based on these results, at this time.  thanks 

## 2023-09-06 NOTE — Patient Instructions (Signed)

## 2023-09-08 ENCOUNTER — Encounter: Payer: Self-pay | Admitting: Internal Medicine

## 2023-09-08 NOTE — Assessment & Plan Note (Signed)
Last vitamin D Lab Results  Component Value Date   VD25OH 29.79 (L) 09/06/2023   Low, to start oral replacement

## 2023-09-08 NOTE — Assessment & Plan Note (Signed)
Age and sex appropriate education and counseling updated with regular exercise and diet Referrals for preventative services - none needed Immunizations addressed - decliens covid booster Smoking counseling  - none needed Evidence for depression or other mood disorder - none significant Most recent labs reviewed. I have personally reviewed and have noted: 1) the patient's medical and social history 2) The patient's current medications and supplements 3) The patient's height, weight, and BMI have been recorded in the chart

## 2023-09-08 NOTE — Assessment & Plan Note (Signed)
Lab Results  Component Value Date   HGBA1C 5.1 09/06/2023   Stable, pt to continue current medical treatment mounjaro 2.5 mg weekly

## 2023-09-27 DIAGNOSIS — J342 Deviated nasal septum: Secondary | ICD-10-CM | POA: Diagnosis not present

## 2023-09-27 DIAGNOSIS — J301 Allergic rhinitis due to pollen: Secondary | ICD-10-CM | POA: Diagnosis not present

## 2023-10-11 ENCOUNTER — Telehealth: Payer: Self-pay

## 2023-10-11 NOTE — Telephone Encounter (Signed)
Copied from CRM [id number]. Topic: Clinical - Lab/Test Results >> Oct 11, 2023 10:30 AM Steele Sizer wrote: Reason for CRM:  PT mom Nettie Elm request a call back to confirm if PT received lab results for his blood and kidney testing.

## 2023-10-30 ENCOUNTER — Telehealth: Payer: Self-pay | Admitting: Diagnostic Neuroimaging

## 2023-10-30 NOTE — Telephone Encounter (Signed)
 Pt called wanting to know if the form that is filled out for him every year for his LAMICTAL  200 MG tablet can be filled out for him again for the 2025 renewal for his insurance stating that he is needing his LAMICTAL  200 MG tablet Brand name only. Please advise.

## 2023-11-01 ENCOUNTER — Telehealth: Payer: Self-pay

## 2023-11-01 ENCOUNTER — Other Ambulatory Visit (HOSPITAL_COMMUNITY): Payer: Self-pay

## 2023-11-01 NOTE — Telephone Encounter (Signed)
 Pharmacy Patient Advocate Encounter   Received notification from Physician's Office that prior authorization for LaMICtal  200MG  tablets is required/requested.   Insurance verification completed.   The patient is insured through [facility] .   Per test claim: PA required; PA submitted to above mentioned insurance via CoverMyMeds Key/confirmation #/EOC [facility] Status is pending

## 2023-11-04 ENCOUNTER — Other Ambulatory Visit (HOSPITAL_COMMUNITY): Payer: Self-pay

## 2023-11-04 ENCOUNTER — Ambulatory Visit: Payer: 59 | Admitting: Diagnostic Neuroimaging

## 2023-11-04 ENCOUNTER — Encounter: Payer: Self-pay | Admitting: Diagnostic Neuroimaging

## 2023-11-04 NOTE — Telephone Encounter (Signed)
    PA can be filled on or after 11/28/2023

## 2023-11-07 ENCOUNTER — Telehealth: Payer: Self-pay | Admitting: Diagnostic Neuroimaging

## 2023-11-07 ENCOUNTER — Ambulatory Visit: Payer: 59 | Admitting: Diagnostic Neuroimaging

## 2023-11-07 NOTE — Telephone Encounter (Signed)
Pt apt was scheduled 11/04/23. Looks like he was originally on 1/16 but that apt was cancelled back in may and switched to 1/13.  The medication had a PA completed and it was already approved through insurance. There had not been anything else for [place] to send.

## 2023-11-07 NOTE — Telephone Encounter (Signed)
This pt came today for what he thought was his appt date, but appt is showing as cancelled but he knew nothing about it. He stated that he was to have some paperwork filled out for his meds. Also are we to r/s this appt? There are no notes in appt about it being cancelled and or why.

## 2023-11-07 NOTE — Telephone Encounter (Signed)
Spoke to pt rescheduled appointment with Dr Marjory Lies 11/2023 pt was appreciative and thanked me for calling

## 2023-12-18 ENCOUNTER — Encounter: Payer: Self-pay | Admitting: Diagnostic Neuroimaging

## 2023-12-18 ENCOUNTER — Ambulatory Visit (INDEPENDENT_AMBULATORY_CARE_PROVIDER_SITE_OTHER): Payer: 59 | Admitting: Diagnostic Neuroimaging

## 2023-12-18 ENCOUNTER — Telehealth: Payer: Self-pay | Admitting: Diagnostic Neuroimaging

## 2023-12-18 VITALS — BP 130/79 | HR 74 | Ht 71.0 in | Wt 233.0 lb

## 2023-12-18 DIAGNOSIS — G40309 Generalized idiopathic epilepsy and epileptic syndromes, not intractable, without status epilepticus: Secondary | ICD-10-CM

## 2023-12-18 MED ORDER — LAMICTAL 200 MG PO TABS: ORAL_TABLET | ORAL | 4 refills | Status: DC

## 2023-12-18 MED ORDER — ETHOSUXIMIDE 250 MG PO CAPS: 500.0000 mg | ORAL_CAPSULE | Freq: Two times a day (BID) | ORAL | 4 refills | Status: AC

## 2023-12-18 NOTE — Telephone Encounter (Signed)
 DPR needs to be signed

## 2023-12-18 NOTE — Progress Notes (Signed)
 Chief Complaint  Patient presents with   Follow-up    Pt in room 7 alone. Here for seizure follow up. Pt reports no recent seizures.     History of Present Illness:  UPDATE (12/18/23, VRP): Since last visit, doing well. Symptoms are stable. No seizures. Tolerating meds.    UPDATE (11/15/22, VRP): Since last visit, doing well until Nov 06, 2022, had breakthrough seizure. No missed doses. No triggering factors. No other new issues, except gradual weight gain since COVID pandemic.   UPDATE (06/04/22, VRP): Since last visit, doing well. Symptoms are stable. No seizures.Tolerating meds.    UPDATE (05/31/21, VRP): Since last visit, doing well. Symptoms are stable. No alleviating or aggravating factors. Tolerating meds. No major seizures. Very rare staring spells.  UPDATE (04/04/20, VRP): Since last visit, doing well until 03/20/2020 1 patient had unprovoked seizure.  Patient was at sister's house, in the car when all of a sudden he made a clicking sound.  Then he had stiffness and generalized convulsions.  Afterwards he was very confused and combative.  Paramedics were called patient went to the emergency room for evaluation.  CT head unremarkable.  Labs unremarkable except for hematuria and elevated creatinine.  Since then patient has returned to baseline.  No further events.  He continues to have a few daily staring spells like in the past.  He is tolerating his medications.  There has been concern about possible chronic dehydration.   UPDATE (02/16/19, VRP):  - continues with daily staring spells (3-4 days per weeks; 5-7 spells per day) - had amb EEG at [facility]; some discharges noted; no clinical events; no subclincal seizures - overall stable - not driving currently; does not plan to drive in near future  UPDATE (05/26/18, VRP): Since last visit, doing about the same. Symptoms are stable. Severity is moderate. No alleviating or aggravating factors. Tolerating anti-seizure meds. Avg ~10 staring spells  per day. Not clear if these are daydreaming or petit mal sz.    PRIOR HPI (07/08/17): 33 year old right-handed male here for evaluation of seizure disorder.   Patient had normal birth and development. At age 75 years old he had intermittent episodes of seizures. Patient would have staring spells as well as grand mal seizures. He would have intermittent muscle jerks. Over the years he was treated with Lamictal and this dose was increased over time. Last grand mal seizure was run 2014.   He was then developing continued problems with intermittent daily staring spells. Therefore he was referred to [facility] video EEG for evaluation in January 2015. During the admission his Lamictal was decreased from 600 mg twice a day down to 300 mg twice a day. Patient had several stereotypical events which were confirmed to be correlated with electrographic seizure activity. He was discharged on Lamictal 400 mg twice a day with ethosuxamide 250mg  twice a day. Patient was tried on Keppra but they could not tolerate side effects. Modified Atkins diet was considered. VNS was considered. In 2016 Dr. Inez Catalina raised possibility of trying Depakote to help with better seizure control, but patient and family declined due to potential side effects.   Patient also had home sleep study which ruled out sleep apnea.   Patient had a ambulatory EEG in November 2017 which showed generalized spike and wave discharges. Patient had one staring spell without EEG correlate. Therefore it was concluded that patient staring spells may not be related to epileptic discharges. Patient last seen at [facility] epilepsy clinic in April 2018.  Now patient presenting here to establish with local neurologist.   Family is concerned about his ongoing daily staring spells, tremors, joint pain, difficulty in working and living independently.    Observations/Objective:  GENERAL EXAM/CONSTITUTIONAL: Vitals:  Vitals:   12/18/23 1005  BP: 130/79  Pulse:  74  Weight: 233 lb (105.7 kg)  Height: 5\' 11"  (1.803 m)   Body mass index is 32.5 kg/m. Wt Readings from Last 3 Encounters:  12/18/23 233 lb (105.7 kg)  09/06/23 236 lb (107 kg)  05/21/23 235 lb (106.6 kg)   Patient is in no distress; well developed, nourished and groomed; neck is supple  CARDIOVASCULAR: Examination of carotid arteries is normal; no carotid bruits Regular rate and rhythm, no murmurs Examination of peripheral vascular system by observation and palpation is normal  EYES: Ophthalmoscopic exam of optic discs and posterior segments is normal; no papilledema or hemorrhages No results found.  MUSCULOSKELETAL: Gait, strength, tone, movements noted in Neurologic exam below  NEUROLOGIC: MENTAL STATUS:      No data to display         awake, alert, oriented to person, place and time recent and remote memory intact normal attention and concentration language fluent, comprehension intact, naming intact fund of knowledge appropriate  CRANIAL NERVE:  2nd - no papilledema on fundoscopic exam 2nd, 3rd, 4th, 6th - pupils equal and reactive to light, visual fields full to confrontation, extraocular muscles intact, no nystagmus 5th - facial sensation symmetric 7th - facial strength symmetric 8th - hearing intact 9th - palate elevates symmetrically, uvula midline 11th - shoulder shrug symmetric 12th - tongue protrusion midline  MOTOR:  normal bulk and tone, full strength in the BUE, BLE MILD POSTURAL TREMOR IN BUE  SENSORY:  normal and symmetric to light touch, temperature, vibration  COORDINATION:  finger-nose-finger, fine finger movements normal  REFLEXES:  deep tendon reflexes present and symmetric  GAIT/STATION:  narrow based gait   06/07/11 EEG - frontally predominant generalized spike and wave discharges  11/28/22  - Normal EEG in the drowsy states.    Assessment and Plan:  33 y.o. male here with history of seizures since age 18 years old,  likely primary generalized epilepsy.    Last grand mal seizures: Nov 2016, May 2021, Jan 2024   Intermittent zone out spells (few per week; few seconds)   Meds tried: lamictal, zarontin, keppra     Dx:   1. Nonintractable generalized idiopathic epilepsy without status epilepticus (HCC)       PLAN:   SEIZURE DISORDER (primary generalized epilepsy; unprovoked breakthrough in Jan 2024) - continue lamictal 400mg  (2 x 200) in AM and 700mg  (3.5 x 200) in PM (brand medically necessary) - continue ETHOSUXAMIDE 250mg  twice a day - consider adding vimpat, topiramate, onfi, epidiolex - in future may consider transition to depakote, which is very effective for primary generalized epilepsy, but caution due to VPA-LTG drug interactions and also weight gain issues - not currently driving   Meds ordered this encounter  Medications   ethosuximide (ZARONTIN) 250 MG capsule    Sig: Take 2 capsules (500 mg total) by mouth 2 (two) times daily.    Dispense:  360 capsule    Refill:  4   LAMICTAL 200 MG tablet    Sig: Take 2 tablets (400 mg total) by mouth every morning AND 3.5 tablets (700 mg total) every evening.    Dispense:  500 tablet    Refill:  4   Follow Up Instructions:  -  Return in about 1 year (around 12/17/2024) for MyChart visit (15 min).        Suanne Marker, MD 12/18/2023, 10:48 AM Certified in Neurology, Neurophysiology and Neuroimaging  [facility] [address], [address] [place], [place] [address] [phone]

## 2024-01-15 ENCOUNTER — Ambulatory Visit (INDEPENDENT_AMBULATORY_CARE_PROVIDER_SITE_OTHER): Payer: 59

## 2024-01-15 VITALS — Ht 71.0 in | Wt 233.0 lb

## 2024-01-15 DIAGNOSIS — Z0000 Encounter for general adult medical examination without abnormal findings: Secondary | ICD-10-CM

## 2024-01-15 NOTE — Progress Notes (Signed)
 Subjective:   Christopher Reed is a 33 y.o. who presents for a Medicare Wellness preventive visit.  Visit Complete: Virtual I connected with  Christopher Reed on 01/15/24 by a video and audio enabled telemedicine application and verified that I am speaking with the correct person using two identifiers.  Patient Location: Home  Provider Location: Home Office  I discussed the limitations of evaluation and management by telemedicine. The patient expressed understanding and agreed to proceed.  Vital Signs: Because this visit was a virtual/telehealth visit, some criteria may be missing or patient reported. Any vitals not documented were not able to be obtained and vitals that have been documented are patient reported.   Persons Participating in Visit: Patient.  AWV Questionnaire: No: Patient Medicare AWV questionnaire was not completed prior to this visit.  Cardiac Risk Factors include: advanced age (>75men, >67 women);male gender;Other (see comment), Risk factor comments: Epilepsy     Objective:    Today's Vitals   01/15/24 1554  Weight: 233 lb (105.7 kg)  Height: 5\' 11"  (1.803 m)   Body mass index is 32.5 kg/m.     01/15/2024    3:59 PM 08/30/2022    2:34 PM 06/20/2022    9:40 AM 08/29/2021   10:20 AM 03/20/2020    1:03 PM 04/29/2013   10:03 AM  Advanced Directives  Does Patient Have a Medical Advance Directive? No No No No No Patient does not have advance directive  Would patient like information on creating a medical advance directive?  No - Patient declined No - Patient declined No - Patient declined No - Patient declined     Current Medications (verified) Outpatient Encounter Medications as of 01/15/2024  Medication Sig   Azelastine HCl 137 MCG/SPRAY SOLN SMARTSIG:1-2 Spray(s) Both Nares Twice Daily   EPINEPHrine 0.3 mg/0.3 mL IJ SOAJ injection Inject into the muscle.   ethosuximide (ZARONTIN) 250 MG capsule Take 2 capsules (500 mg total) by mouth 2 (two) times daily.    LAMICTAL 200 MG tablet Take 2 tablets (400 mg total) by mouth every morning AND 3.5 tablets (700 mg total) every evening.   No facility-administered encounter medications on file as of 01/15/2024.    Allergies (verified) Bee venom   History: Past Medical History:  Diagnosis Date   Anxiety    Seizures (HCC)    epilepsy since age 26yrs, sz 03/20/20   SINUSITIS- ACUTE-NOS 09/25/2010   TESTICULAR MASS 11/09/2009   UTI 11/09/2009   Past Surgical History:  Procedure Laterality Date   ANKLE ARTHROSCOPY Left    DG THUMB RIGHT HAND ([facility] HX)     2-3 yrs ago 2015 or 2016   Knee surgury  03/2006   Right knee - meniscus tear/minor ACL tear   LIGAMENT REPAIR Right 05/07/2013   Procedure: cheilectomy, microfracture of metacarpal head surface right thumb;  Surgeon: Wyn Forster., MD;  Location: [facility];  Service: Orthopedics;  Laterality: Right;   MYRINGOTOMY WITH TUBE PLACEMENT     as child   TOE SURGERY Right    Family History  Problem Relation Age of Onset   Diabetes Other        1st degree relative   Arthritis Other    Hypertension Other    Other Maternal Grandfather        Spinal Meningitis-Died at 32   Hepatitis C Paternal Grandmother        Died at 63   Pneumonia Paternal Grandfather  Died at 3   Hypertension Mother    Hypertension Father    Diabetes Father        type 2   Social History   Socioeconomic History   Marital status: Single    Spouse name: Not on file   Number of children: Not on file   Years of education: Not on file   Highest education level: Not on file  Occupational History   Occupation: Working part time/cashier  Tobacco Use   Smoking status: Never   Smokeless tobacco: Never  Vaping Use   Vaping status: Never Used  Substance and Sexual Activity   Alcohol use: No   Drug use: No   Sexual activity: Never  Other Topics Concern   Not on file  Social History Narrative   Lives at home with parents.  Works at [employer].  HS Education.  Pt is single and no children.  Caffeine 2 cups avg when drinks.     Social Drivers of Corporate investment banker Strain: Low Risk  (01/15/2024)   Overall Financial Resource Strain (CARDIA)    Difficulty of Paying Living Expenses: Not hard at all  Food Insecurity: No Food Insecurity (01/15/2024)   Hunger Vital Sign    Worried About Running Out of Food in the Last Year: Never true    Ran Out of Food in the Last Year: Never true  Transportation Needs: No Transportation Needs (01/15/2024)   PRAPARE - Administrator, Civil Service (Medical): No    Lack of Transportation (Non-Medical): No  Physical Activity: Sufficiently Active (01/15/2024)   Exercise Vital Sign    Days of Exercise per Week: 7 days    Minutes of Exercise per Session: 60 min  Stress: No Stress Concern Present (01/15/2024)   [employer] of Occupational Health - Occupational Stress Questionnaire    Feeling of Stress : Not at all  Social Connections: Socially Isolated (01/15/2024)   Social Connection and Isolation Panel [NHANES]    Frequency of Communication with Friends and Family: Twice a week    Frequency of Social Gatherings with Friends and Family: Twice a week    Attends Religious Services: Never    Database administrator or Organizations: No    Attends Engineer, structural: Never    Marital Status: Never married    Tobacco Counseling Counseling given: Not Answered    Clinical Intake:  Pre-visit preparation completed: Yes  Pain : No/denies pain     BMI - recorded: 32.5 Nutritional Status: BMI > 30  Obese Nutritional Risks: None Diabetes: No  Lab Results  Component Value Date   HGBA1C 5.1 09/06/2023   HGBA1C 5.3 08/10/2022   HGBA1C 5.0 08/11/2021     How often do you need to have someone help you when you read instructions, pamphlets, or other written materials from your doctor or pharmacy?: 1 - Never  Interpreter Needed?: No  Information entered by  :: Kelwin Gibler, RMA   Activities of Daily Living     01/15/2024    3:56 PM  In your present state of health, do you have any difficulty performing the following activities:  Hearing? 0  Vision? 0  Difficulty concentrating or making decisions? 0  Walking or climbing stairs? 0  Dressing or bathing? 0  Doing errands, shopping? 0  Comment Parents drive him around.  Preparing Food and eating ? N  Using the Toilet? N  In the past six months, have you accidently  leaked urine? N  Do you have problems with loss of bowel control? N  Managing your Medications? N  Managing your Finances? N  Housekeeping or managing your Housekeeping? N    Patient Care Team: Corwin Levins, MD as PCP - [facility] (Ophthalmology)  Indicate any recent Medical Services you may have received from other than [facility] providers in the past year (date may be approximate).     Assessment:   This is a routine wellness examination for [place].  Hearing/Vision screen Hearing Screening - Comments:: Denies hearing difficulties   Vision Screening - Comments:: Wears eyeglasses   Goals Addressed             This Visit's Progress    Client understands the importance of follow-up with providers by attending scheduled visits   On track      Depression Screen     01/15/2024    4:03 PM 09/06/2023    1:06 PM 04/17/2023    3:21 PM 11/23/2022    1:38 PM 08/30/2022    2:35 PM 08/15/2022    1:15 PM 08/15/2022    1:06 PM  PHQ 2/9 Scores  PHQ - 2 Score 0 0 0 0 0 0 0  PHQ- 9 Score 0   0 0  0    Fall Risk     01/15/2024    3:59 PM 09/06/2023    1:06 PM 04/17/2023    3:21 PM 11/23/2022    1:38 PM 08/30/2022    2:34 PM  Fall Risk   Falls in the past year? 0 0 0 1 0  Number falls in past yr: 0 0 0 0 0  Injury with Fall? 0 0 0 1 0  Risk for fall due to : No Fall Risks No Fall Risks No Fall Risks Impaired balance/gait No Fall Risks  Follow up Falls prevention discussed;Falls evaluation completed Falls evaluation  completed Falls evaluation completed Falls evaluation completed Falls prevention discussed    MEDICARE RISK AT HOME:  Medicare Risk at Home Any stairs in or around the home?: Yes If so, are there any without handrails?: Yes Home free of loose throw rugs in walkways, pet beds, electrical cords, etc?: Yes Adequate lighting in your home to reduce risk of falls?: Yes Life alert?: No Use of a cane, walker or w/c?: No Grab bars in the bathroom?: No Shower chair or bench in shower?: No Elevated toilet seat or a handicapped toilet?: No  TIMED UP AND GO:  Was the test performed?  No  Cognitive Function: Normal: Normal cognitive status assessed by direct observation by this Clinical Health Advisor. No abnormalities found. Patient is able to answer questions in an accurate and timely manner.        08/30/2022    2:41 PM  6CIT Screen  What Year? 0 points  What month? 0 points  What time? 0 points  Count back from 20 0 points  Months in reverse 0 points  Repeat phrase 0 points  Total Score 0 points    Immunizations Immunization History  Administered Date(s) Administered   Influenza Split 07/26/2011, 07/31/2012   Influenza Whole 09/25/2010   Influenza, Seasonal, Injecte, Preservative Fre 07/02/2023   Influenza,inj,Quad PF,6+ Mos 07/17/2013, 07/30/2014, 07/22/2015, 06/15/2016, 07/03/2017, 08/07/2018, 07/10/2019, 07/08/2020, 07/06/2021, 06/29/2022   Moderna Sars-Covid-2 Vaccination 12/31/2019, 02/02/2020   Td 11/09/2009   Tdap 08/15/2022    Screening Tests Health Maintenance  Topic Date Due   COVID-19 Vaccine (3 - Moderna risk series) 03/01/2020  Medicare Annual Wellness (AWV)  08/31/2023   DTaP/Tdap/Td (3 - Td or Tdap) 08/15/2032   INFLUENZA VACCINE  Completed   Hepatitis C Screening  Completed   HIV Screening  Completed   HPV VACCINES  Aged Out    Health Maintenance  Health Maintenance Due  Topic Date Due   COVID-19 Vaccine (3 - Moderna risk series) 03/01/2020    Medicare Annual Wellness (AWV)  08/31/2023   Health Maintenance Items Addressed: See Nurse Notes  Additional Screening:  Vision Screening: Recommended annual ophthalmology exams for early detection of glaucoma and other disorders of the eye.  Dental Screening: Recommended annual dental exams for proper oral hygiene  Community Resource Referral / Chronic Care Management: CRR required this visit?  No   CCM required this visit?  No     Plan:     I have personally reviewed and noted the following in the patient's chart:   Medical and social history Use of alcohol, tobacco or illicit drugs  Current medications and supplements including opioid prescriptions. Patient is not currently taking opioid prescriptions. Functional ability and status Nutritional status Physical activity Advanced directives List of other physicians Hospitalizations, surgeries, and ER visits in previous 12 months Vitals Screenings to include cognitive, depression, and falls Referrals and appointments  In addition, I have reviewed and discussed with patient certain preventive protocols, quality metrics, and best practice recommendations. A written personalized care plan for preventive services as well as general preventive health recommendations were provided to patient.     Delmore Sear L Welma Mccombs, CMA   01/15/2024   After Visit Summary: (MyChart) Due to this being a telephonic visit, the after visit summary with patients personalized plan was offered to patient via MyChart   Notes: Nothing significant to report at this time.

## 2024-01-15 NOTE — Patient Instructions (Signed)
 Mr. Camino , Thank you for taking time to come for your Medicare Wellness Visit. I appreciate your ongoing commitment to your health goals. Please review the following plan we discussed and let me know if I can assist you in the future.   Referrals/Orders/Follow-Ups/Clinician Recommendations: It was nice talking with you today.  Keep up the good work.  This is a list of the screening recommended for you and due dates:  Health Maintenance  Topic Date Due   COVID-19 Vaccine (3 - Moderna risk series) 03/01/2020   Medicare Annual Wellness Visit  01/14/2025   DTaP/Tdap/Td vaccine (3 - Td or Tdap) 08/15/2032   Flu Shot  Completed   Hepatitis C Screening  Completed   HIV Screening  Completed   HPV Vaccine  Aged Out    Advanced directives: (Declined) Advance directive discussed with you today. Even though you declined this today, please call our office should you change your mind, and we can give you the proper paperwork for you to fill out.  Next Medicare Annual Wellness Visit scheduled for next year: Yes

## 2024-02-09 ENCOUNTER — Encounter: Payer: Self-pay | Admitting: Diagnostic Neuroimaging

## 2024-02-22 ENCOUNTER — Other Ambulatory Visit: Payer: Self-pay | Admitting: Diagnostic Neuroimaging

## 2024-02-24 NOTE — Telephone Encounter (Signed)
 Patient called to check on refill request.  Informed patient request in neurologist que for him to approve and send to the pharmacy.

## 2024-02-25 ENCOUNTER — Other Ambulatory Visit (HOSPITAL_COMMUNITY): Payer: Self-pay

## 2024-02-25 ENCOUNTER — Other Ambulatory Visit: Payer: Self-pay | Admitting: Neurology

## 2024-02-25 MED ORDER — LAMICTAL 200 MG PO TABS: ORAL_TABLET | ORAL | 4 refills | Status: AC

## 2024-03-04 ENCOUNTER — Other Ambulatory Visit (HOSPITAL_COMMUNITY): Payer: Self-pay

## 2024-03-04 ENCOUNTER — Telehealth: Payer: Self-pay

## 2024-03-04 NOTE — Telephone Encounter (Signed)
 Pharmacy Patient Advocate Encounter   Received notification from CoverMyMeds that prior authorization for LaMICtal  200MG  tablets is required/requested.   Insurance verification completed.   The patient is insured through [facility] .   Per test claim: Refill too soon. PA is not needed at this time. Medication was filled 02/27/2024. Next eligible fill date is 03/21/2024.

## 2024-07-14 ENCOUNTER — Ambulatory Visit: Admitting: Internal Medicine

## 2024-07-14 ENCOUNTER — Ambulatory Visit (INDEPENDENT_AMBULATORY_CARE_PROVIDER_SITE_OTHER)

## 2024-07-14 DIAGNOSIS — Z23 Encounter for immunization: Secondary | ICD-10-CM | POA: Diagnosis not present

## 2024-07-14 NOTE — Progress Notes (Signed)
 Flu vaccine given w/o complications

## 2024-08-31 ENCOUNTER — Ambulatory Visit: Admitting: Internal Medicine

## 2024-10-06 ENCOUNTER — Encounter: Payer: Self-pay | Admitting: Internal Medicine

## 2024-10-06 ENCOUNTER — Ambulatory Visit: Admitting: Internal Medicine

## 2024-10-06 VITALS — BP 122/80 | HR 79 | Temp 98.4°F | Ht 71.0 in | Wt 233.0 lb

## 2024-10-06 DIAGNOSIS — M25562 Pain in left knee: Secondary | ICD-10-CM

## 2024-10-06 DIAGNOSIS — E559 Vitamin D deficiency, unspecified: Secondary | ICD-10-CM

## 2024-10-06 DIAGNOSIS — M25561 Pain in right knee: Secondary | ICD-10-CM | POA: Insufficient documentation

## 2024-10-06 DIAGNOSIS — Z0000 Encounter for general adult medical examination without abnormal findings: Secondary | ICD-10-CM | POA: Diagnosis not present

## 2024-10-06 DIAGNOSIS — G5601 Carpal tunnel syndrome, right upper limb: Secondary | ICD-10-CM | POA: Insufficient documentation

## 2024-10-06 DIAGNOSIS — R739 Hyperglycemia, unspecified: Secondary | ICD-10-CM

## 2024-10-06 DIAGNOSIS — Z0001 Encounter for general adult medical examination with abnormal findings: Secondary | ICD-10-CM

## 2024-10-06 MED ORDER — CELECOXIB 200 MG PO CAPS: 200.0000 mg | ORAL_CAPSULE | Freq: Two times a day (BID) | ORAL | 1 refills | Status: AC | PRN

## 2024-10-06 NOTE — Assessment & Plan Note (Signed)
 Last vitamin D Lab Results  Component Value Date   VD25OH 29.79 (L) 09/06/2023   Low, to start oral replacement

## 2024-10-06 NOTE — Patient Instructions (Addendum)
 Please take OTC Vitamin D3 at 2000 units per day, as you are  Please take all new medication as prescribed - the celebrex  anti-inflammatory medication for pain as needed  Please continue all other medications as before, and refills have been done if requested.  Please have the pharmacy call with any other refills you may need.  Please continue your efforts at being more active, low cholesterol diet, and weight control.  You are otherwise up to date with prevention measures today.  Please keep your appointments with your specialists as you may have planned  You will be contacted regarding the referral for: Hand Surgury  Please go to the LAB at the blood drawing area for the tests to be done  You will be contacted by phone if any changes need to be made immediately.  Otherwise, you will receive a letter about your results with an explanation, but please check with MyChart first.  Please make an Appointment to return for your 1 year visit, or sooner if needed

## 2024-10-06 NOTE — Assessment & Plan Note (Signed)
 Age and sex appropriate education and counseling updated with regular exercise and diet Referrals for preventative services - none needed Immunizations addressed - declines all vaccinations today Smoking counseling  - none needed Evidence for depression or other mood disorder - none significant Most recent labs reviewed. I have personally reviewed and have noted: 1) the patient's medical and social history 2) The patient's current medications and supplements 3) The patient's height, weight, and BMI have been recorded in the chart

## 2024-10-06 NOTE — Assessment & Plan Note (Signed)
 Lab Results  Component Value Date   HGBA1C 5.1 09/06/2023   Stable, pt to continue current medical treatment  - diet,wt control

## 2024-10-06 NOTE — Progress Notes (Signed)
 Patient ID: Christopher Reed, male   DOB: 1990/10/25, 33 y.o.   MRN: [id number]         Chief Complaint:: wellness exam and low vit d, right CTS, hyperglycemia, bilateral knee pain       HPI:  [name] is a 33 y.o. male here for wellness exam; declines all immunizations, o/w up to date                        Also has > 1 mo onset right wrist and hand pain with radiation towards the elbow, and assoc with mostly constant numbness and even weakness of the third and 4th fingers.  Also has bilateral knee pain worse after standing all day at work, without giveaways, falls, trauma, fever.  Pt denies chest pain, increased sob or doe, wheezing, orthopnea, PND, increased LE swelling, palpitations, dizziness or syncope.   Pt denies polydipsia, polyuria, or new focal neuro s/s.    Pt denies fever, wt loss, night sweats, loss of appetite, or other constitutional symptoms      Wt Readings from Last 3 Encounters:  10/06/24 233 lb (105.7 kg)  01/15/24 233 lb (105.7 kg)  12/18/23 233 lb (105.7 kg)   BP Readings from Last 3 Encounters:  10/06/24 122/80  12/18/23 130/79  09/06/23 136/78   Immunization History  Administered Date(s) Administered   Influenza Split 07/26/2011, 07/31/2012   Influenza Whole 09/25/2010   Influenza, Seasonal, Injecte, Preservative Fre 07/02/2023, 07/14/2024   Influenza,inj,Quad PF,6+ Mos 07/17/2013, 07/30/2014, 07/22/2015, 06/15/2016, 07/03/2017, 08/07/2018, 07/10/2019, 07/08/2020, 07/06/2021, 06/29/2022   Moderna Sars-Covid-2 Vaccination 12/31/2019, 02/02/2020   Td 11/09/2009   Tdap 08/15/2022   There are no preventive care reminders to display for this patient.     Past Medical History:  Diagnosis Date   Anxiety    Seizures (HCC)    epilepsy since age 31yrs, sz 03/20/20   SINUSITIS- ACUTE-NOS 09/25/2010   TESTICULAR MASS 11/09/2009   UTI 11/09/2009   Past Surgical History:  Procedure Laterality Date   ANKLE ARTHROSCOPY Left    DG THUMB RIGHT HAND ([facility] HX)      2-3 yrs ago 2015 or 2016   Knee surgury  03/2006   Right knee - meniscus tear/minor ACL tear   LIGAMENT REPAIR Right 05/07/2013   Procedure: cheilectomy, microfracture of metacarpal head surface right thumb;  Surgeon: Lamar LULLA Leonor Mickey., MD;  Location: [facility];  Service: Orthopedics;  Laterality: Right;   MYRINGOTOMY WITH TUBE PLACEMENT     as child   TOE SURGERY Right     reports that he has never smoked. He has never used smokeless tobacco. He reports that he does not drink alcohol and does not use drugs. family history includes Arthritis in an other family member; Diabetes in his father and another family member; Hepatitis C in his paternal grandmother; Hypertension in his father, mother, and another family member; Other in his maternal grandfather; Pneumonia in his paternal grandfather. Allergies[1] Medications Ordered Prior to Encounter[2]      ROS:  All others reviewed and negative.  Objective        PE:  BP 122/80 (BP Location: Right Arm, Patient Position: Sitting, Cuff Size: Normal)   Pulse 79   Temp 98.4 F (36.9 C) (Oral)   Ht 5' 11 (1.803 m)   Wt 233 lb (105.7 kg)   SpO2 98%   BMI 32.50 kg/m  Constitutional: Pt appears in NAD               HENT: Head: NCAT.                Right Ear: External ear normal.                 Left Ear: External ear normal.                Eyes: . Pupils are equal, round, and reactive to light. Conjunctivae and EOM are normal               Nose: without d/c or deformity               Neck: Neck supple. Gross normal ROM               Cardiovascular: Normal rate and regular rhythm.                 Pulmonary/Chest: Effort normal and breath sounds without rales or wheezing.                Abd:  Soft, NT, ND, + BS, no organomegaly               Neurological: Pt is alert. At baseline orientation, motor grossly intact               Skin: Skin is warm. No rashes, no other new lesions, LE edema - none                Psychiatric: Pt behavior is normal without agitation   Micro: none  Cardiac tracings I have personally interpreted today:  none  Pertinent Radiological findings (summarize): none   Lab Results  Component Value Date   WBC 5.8 09/06/2023   HGB 15.6 09/06/2023   HCT 45.6 09/06/2023   PLT 287.0 09/06/2023   GLUCOSE 90 09/06/2023   CHOL 172 09/06/2023   TRIG 206.0 (H) 09/06/2023   HDL 32.50 (L) 09/06/2023   LDLDIRECT 96.0 08/11/2021   LDLCALC 98 09/06/2023   ALT 33 09/06/2023   AST 21 09/06/2023   NA 138 09/06/2023   K 3.8 09/06/2023   CL 101 09/06/2023   CREATININE 1.10 09/06/2023   BUN 13 09/06/2023   CO2 30 09/06/2023   TSH 0.81 09/06/2023   HGBA1C 5.1 09/06/2023   Assessment/Plan:  [name] is a 33 y.o. White or Caucasian [1] male with  has a past medical history of Anxiety, Seizures (HCC), SINUSITIS- ACUTE-NOS (09/25/2010), TESTICULAR MASS (11/09/2009), and UTI (11/09/2009).  Encounter for well adult exam with abnormal findings Age and sex appropriate education and counseling updated with regular exercise and diet Referrals for preventative services - none needed Immunizations addressed - declines all vaccinations today Smoking counseling  - none needed Evidence for depression or other mood disorder - none significant Most recent labs reviewed. I have personally reviewed and have noted: 1) the patient's medical and social history 2) The patient's current medications and supplements 3) The patient's height, weight, and BMI have been recorded in the chart   Vitamin D  deficiency Last vitamin D  Lab Results  Component Value Date   VD25OH 29.79 (L) 09/06/2023   Low, to start oral replacement   Right carpal tunnel syndrome With weakness of 2 fingers intermittent, now for hand surgury referral  Hyperglycemia Lab Results  Component Value Date   HGBA1C 5.1 09/06/2023   Stable, pt to continue current medical treatment  -  diet,wt control   Bilateral knee  pain Exam benign, likely overuse related, for nsaid prn  Followup: Return in about 1 year (around 10/06/2025).  Lynwood Rush, MD 10/06/2024 7:51 PM [facility]  Primary Care - [facility]     [1]  Allergies Allergen Reactions   Bee Venom     angioedema  [2]  Current Outpatient Medications on File Prior to Visit  Medication Sig Dispense Refill   Azelastine HCl 137 MCG/SPRAY SOLN SMARTSIG:1-2 Spray(s) Both Nares Twice Daily     EPINEPHrine  0.3 mg/0.3 mL IJ SOAJ injection Inject into the muscle.     ethosuximide  (ZARONTIN ) 250 MG capsule Take 2 capsules (500 mg total) by mouth 2 (two) times daily. 360 capsule 4   LAMICTAL  200 MG tablet Take 2 tablets (400 mg total) by mouth every morning AND 3.5 tablets (700 mg total) every evening. 500 tablet 4   No current facility-administered medications on file prior to visit.

## 2024-10-06 NOTE — Assessment & Plan Note (Signed)
 Exam benign, likely overuse related, for nsaid prn

## 2024-10-06 NOTE — Assessment & Plan Note (Signed)
 With weakness of 2 fingers intermittent, now for hand surgury referral

## 2024-10-07 ENCOUNTER — Other Ambulatory Visit

## 2024-10-07 ENCOUNTER — Ambulatory Visit: Payer: Self-pay | Admitting: Internal Medicine

## 2024-10-07 DIAGNOSIS — E559 Vitamin D deficiency, unspecified: Secondary | ICD-10-CM

## 2024-10-07 DIAGNOSIS — R739 Hyperglycemia, unspecified: Secondary | ICD-10-CM

## 2024-10-07 LAB — CBC WITH DIFFERENTIAL/PLATELET
Basophils Absolute: 0 K/uL (ref 0.0–0.1)
Basophils Relative: 0.9 % (ref 0.0–3.0)
Eosinophils Absolute: 0.1 K/uL (ref 0.0–0.7)
Eosinophils Relative: 3.1 % (ref 0.0–5.0)
HCT: 44.4 % (ref 39.0–52.0)
Hemoglobin: 15.5 g/dL (ref 13.0–17.0)
Lymphocytes Relative: 32 % (ref 12.0–46.0)
Lymphs Abs: 1.5 K/uL (ref 0.7–4.0)
MCHC: 34.9 g/dL (ref 30.0–36.0)
MCV: 95.2 fl (ref 78.0–100.0)
Monocytes Absolute: 0.5 K/uL (ref 0.1–1.0)
Monocytes Relative: 9.7 % (ref 3.0–12.0)
Neutro Abs: 2.6 K/uL (ref 1.4–7.7)
Neutrophils Relative %: 54.3 % (ref 43.0–77.0)
Platelets: 248 K/uL (ref 150.0–400.0)
RBC: 4.66 Mil/uL (ref 4.22–5.81)
RDW: 12.4 % (ref 11.5–15.5)
WBC: 4.8 K/uL (ref 4.0–10.5)

## 2024-10-07 LAB — URINALYSIS, ROUTINE W REFLEX MICROSCOPIC
Bilirubin Urine: NEGATIVE
Hgb urine dipstick: NEGATIVE
Ketones, ur: NEGATIVE
Leukocytes,Ua: NEGATIVE
Nitrite: NEGATIVE
RBC / HPF: NONE SEEN (ref 0–?)
Specific Gravity, Urine: 1.02 (ref 1.000–1.030)
Total Protein, Urine: NEGATIVE
Urine Glucose: NEGATIVE
Urobilinogen, UA: 0.2 (ref 0.0–1.0)
pH: 7 (ref 5.0–8.0)

## 2024-10-07 LAB — LIPID PANEL
Cholesterol: 165 mg/dL (ref 28–200)
HDL: 36.2 mg/dL — ABNORMAL LOW (ref >39.00)
LDL Cholesterol: 111 mg/dL — ABNORMAL HIGH (ref 10–99)
NonHDL: 128.79
Total CHOL/HDL Ratio: 5
Triglycerides: 91 mg/dL (ref 10.0–149.0)
VLDL: 18.2 mg/dL (ref 0.0–40.0)

## 2024-10-07 LAB — HEPATIC FUNCTION PANEL
ALT: 27 U/L (ref 3–53)
AST: 16 U/L (ref 5–37)
Albumin: 4.7 g/dL (ref 3.5–5.2)
Alkaline Phosphatase: 65 U/L (ref 39–117)
Bilirubin, Direct: 0.1 mg/dL (ref 0.1–0.3)
Total Bilirubin: 0.6 mg/dL (ref 0.2–1.2)
Total Protein: 7.3 g/dL (ref 6.0–8.3)

## 2024-10-07 LAB — BASIC METABOLIC PANEL WITH GFR
BUN: 15 mg/dL (ref 6–23)
CO2: 31 meq/L (ref 19–32)
Calcium: 9.4 mg/dL (ref 8.4–10.5)
Chloride: 102 meq/L (ref 96–112)
Creatinine, Ser: 1.09 mg/dL (ref 0.40–1.50)
GFR: 88.89 mL/min (ref >60.00)
Glucose, Bld: 100 mg/dL — ABNORMAL HIGH (ref 70–99)
Potassium: 3.9 meq/L (ref 3.5–5.1)
Sodium: 138 meq/L (ref 135–145)

## 2024-10-07 LAB — VITAMIN D 25 HYDROXY (VIT D DEFICIENCY, FRACTURES): VITD: 29.73 ng/mL — ABNORMAL LOW (ref 30.00–100.00)

## 2024-10-07 LAB — HEMOGLOBIN A1C: Hgb A1c MFr Bld: 5 % (ref 4.6–6.5)

## 2024-10-07 LAB — TSH: TSH: 0.97 u[IU]/mL (ref 0.35–5.50)

## 2024-11-03 ENCOUNTER — Ambulatory Visit: Admitting: Orthopaedic Surgery

## 2024-12-15 ENCOUNTER — Telehealth: Payer: 59 | Admitting: Diagnostic Neuroimaging

## 2025-01-18 ENCOUNTER — Ambulatory Visit

## 2025-01-29 ENCOUNTER — Ambulatory Visit
